# Patient Record
Sex: Male | Born: 1940
Health system: Southern US, Community
[De-identification: ages and names within clinical notes are randomized; demographics above are authoritative.]

## PROBLEM LIST (undated history)

## (undated) DIAGNOSIS — T4145XA Adverse effect of unspecified anesthetic, initial encounter: Secondary | ICD-10-CM

## (undated) DIAGNOSIS — Z8719 Personal history of other diseases of the digestive system: Secondary | ICD-10-CM

## (undated) DIAGNOSIS — K449 Diaphragmatic hernia without obstruction or gangrene: Secondary | ICD-10-CM

## (undated) DIAGNOSIS — F329 Major depressive disorder, single episode, unspecified: Secondary | ICD-10-CM

## (undated) DIAGNOSIS — IMO0001 Reserved for inherently not codable concepts without codable children: Secondary | ICD-10-CM

## (undated) DIAGNOSIS — I10 Essential (primary) hypertension: Secondary | ICD-10-CM

## (undated) DIAGNOSIS — E538 Deficiency of other specified B group vitamins: Secondary | ICD-10-CM

## (undated) DIAGNOSIS — F419 Anxiety disorder, unspecified: Secondary | ICD-10-CM

## (undated) DIAGNOSIS — F32A Depression, unspecified: Secondary | ICD-10-CM

## (undated) DIAGNOSIS — M1712 Unilateral primary osteoarthritis, left knee: Secondary | ICD-10-CM

## (undated) DIAGNOSIS — E786 Lipoprotein deficiency: Secondary | ICD-10-CM

## (undated) DIAGNOSIS — Z87442 Personal history of urinary calculi: Secondary | ICD-10-CM

## (undated) DIAGNOSIS — M25472 Effusion, left ankle: Secondary | ICD-10-CM

## (undated) DIAGNOSIS — Z862 Personal history of diseases of the blood and blood-forming organs and certain disorders involving the immune mechanism: Secondary | ICD-10-CM

## (undated) DIAGNOSIS — K922 Gastrointestinal hemorrhage, unspecified: Secondary | ICD-10-CM

## (undated) DIAGNOSIS — I2510 Atherosclerotic heart disease of native coronary artery without angina pectoris: Secondary | ICD-10-CM

## (undated) DIAGNOSIS — M1990 Unspecified osteoarthritis, unspecified site: Secondary | ICD-10-CM

## (undated) DIAGNOSIS — K219 Gastro-esophageal reflux disease without esophagitis: Secondary | ICD-10-CM

## (undated) DIAGNOSIS — E611 Iron deficiency: Secondary | ICD-10-CM

## (undated) DIAGNOSIS — M25471 Effusion, right ankle: Secondary | ICD-10-CM

## (undated) DIAGNOSIS — E785 Hyperlipidemia, unspecified: Secondary | ICD-10-CM

## (undated) DIAGNOSIS — D649 Anemia, unspecified: Secondary | ICD-10-CM

## (undated) DIAGNOSIS — T8859XA Other complications of anesthesia, initial encounter: Secondary | ICD-10-CM

## (undated) DIAGNOSIS — F1011 Alcohol abuse, in remission: Secondary | ICD-10-CM

## (undated) DIAGNOSIS — E222 Syndrome of inappropriate secretion of antidiuretic hormone: Secondary | ICD-10-CM

## (undated) HISTORY — DX: Unilateral primary osteoarthritis, left knee: M17.12

## (undated) HISTORY — PX: COLONOSCOPY: SHX5424

## (undated) HISTORY — PX: MINOR HEMORRHOIDECTOMY: SHX6238

## (undated) HISTORY — DX: Iron deficiency: E61.1

## (undated) HISTORY — PX: JOINT REPLACEMENT: SHX530

## (undated) HISTORY — DX: Deficiency of other specified B group vitamins: E53.8

## (undated) HISTORY — DX: Essential (primary) hypertension: I10

## (undated) HISTORY — PX: KNEE ARTHROSCOPY: SHX127

## (undated) HISTORY — DX: Syndrome of inappropriate secretion of antidiuretic hormone: E22.2

## (undated) HISTORY — PX: CORONARY ANGIOPLASTY: SHX604

## (undated) HISTORY — DX: Anxiety disorder, unspecified: F41.9

## (undated) HISTORY — DX: Personal history of diseases of the blood and blood-forming organs and certain disorders involving the immune mechanism: Z86.2

## (undated) HISTORY — DX: Hyperlipidemia, unspecified: E78.5

## (undated) HISTORY — PX: CARDIOVASCULAR STRESS TEST: SHX262

## (undated) HISTORY — DX: Diaphragmatic hernia without obstruction or gangrene: K44.9

## (undated) HISTORY — DX: Atherosclerotic heart disease of native coronary artery without angina pectoris: I25.10

## (undated) HISTORY — DX: Lipoprotein deficiency: E78.6

## (undated) HISTORY — PX: CATARACT EXTRACTION W/ INTRAOCULAR LENS  IMPLANT, BILATERAL: SHX1307

## (undated) HISTORY — DX: Reserved for inherently not codable concepts without codable children: IMO0001

## (undated) HISTORY — PX: EYE SURGERY: SHX253

## (undated) HISTORY — DX: Alcohol abuse, in remission: F10.11

---
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
## 1993-01-30 HISTORY — PX: CARDIAC CATHETERIZATION: SHX172

## 1998-10-25 ENCOUNTER — Encounter: Payer: Self-pay | Admitting: Cardiology

## 1998-10-25 ENCOUNTER — Ambulatory Visit (HOSPITAL_COMMUNITY): Admission: RE | Admit: 1998-10-25 | Discharge: 1998-10-25 | Payer: Self-pay | Admitting: Cardiology

## 1999-11-08 ENCOUNTER — Encounter: Payer: Self-pay | Admitting: Orthopedic Surgery

## 1999-11-08 ENCOUNTER — Ambulatory Visit (HOSPITAL_COMMUNITY): Admission: RE | Admit: 1999-11-08 | Discharge: 1999-11-08 | Payer: Self-pay | Admitting: Orthopedic Surgery

## 2000-04-28 ENCOUNTER — Emergency Department (HOSPITAL_COMMUNITY): Admission: EM | Admit: 2000-04-28 | Discharge: 2000-04-28 | Payer: Self-pay | Admitting: Emergency Medicine

## 2000-12-12 ENCOUNTER — Emergency Department (HOSPITAL_COMMUNITY): Admission: EM | Admit: 2000-12-12 | Discharge: 2000-12-12 | Payer: Self-pay | Admitting: Emergency Medicine

## 2001-03-05 ENCOUNTER — Emergency Department (HOSPITAL_COMMUNITY): Admission: EM | Admit: 2001-03-05 | Discharge: 2001-03-05 | Payer: Self-pay | Admitting: Emergency Medicine

## 2001-07-01 ENCOUNTER — Emergency Department (HOSPITAL_COMMUNITY): Admission: EM | Admit: 2001-07-01 | Discharge: 2001-07-01 | Payer: Self-pay | Admitting: Emergency Medicine

## 2001-07-03 ENCOUNTER — Inpatient Hospital Stay (HOSPITAL_COMMUNITY): Admission: EM | Admit: 2001-07-03 | Discharge: 2001-07-06 | Payer: Self-pay | Admitting: Psychiatry

## 2001-07-07 ENCOUNTER — Other Ambulatory Visit (HOSPITAL_COMMUNITY): Admission: RE | Admit: 2001-07-07 | Discharge: 2001-07-11 | Payer: Self-pay | Admitting: *Deleted

## 2002-04-23 ENCOUNTER — Emergency Department (HOSPITAL_COMMUNITY): Admission: EM | Admit: 2002-04-23 | Discharge: 2002-04-23 | Payer: Self-pay

## 2004-10-02 ENCOUNTER — Emergency Department (HOSPITAL_COMMUNITY): Admission: EM | Admit: 2004-10-02 | Discharge: 2004-10-02 | Payer: Self-pay | Admitting: Emergency Medicine

## 2005-09-04 HISTORY — PX: US ECHOCARDIOGRAPHY: HXRAD669

## 2006-04-24 ENCOUNTER — Ambulatory Visit: Payer: Self-pay | Admitting: Gastroenterology

## 2008-08-02 HISTORY — PX: REPLACEMENT TOTAL KNEE: SUR1224

## 2008-08-04 ENCOUNTER — Inpatient Hospital Stay (HOSPITAL_COMMUNITY): Admission: RE | Admit: 2008-08-04 | Discharge: 2008-08-06 | Payer: Self-pay | Admitting: Orthopedic Surgery

## 2009-04-28 ENCOUNTER — Ambulatory Visit (HOSPITAL_COMMUNITY): Admission: RE | Admit: 2009-04-28 | Discharge: 2009-04-28 | Payer: Self-pay | Admitting: Psychiatry

## 2010-08-29 ENCOUNTER — Ambulatory Visit (INDEPENDENT_AMBULATORY_CARE_PROVIDER_SITE_OTHER): Payer: Self-pay | Admitting: Cardiology

## 2010-08-29 DIAGNOSIS — I2510 Atherosclerotic heart disease of native coronary artery without angina pectoris: Secondary | ICD-10-CM

## 2010-09-08 ENCOUNTER — Telehealth (INDEPENDENT_AMBULATORY_CARE_PROVIDER_SITE_OTHER): Payer: Self-pay | Admitting: *Deleted

## 2010-09-11 ENCOUNTER — Ambulatory Visit (HOSPITAL_COMMUNITY): Payer: Medicare Other | Attending: Cardiology

## 2010-09-11 ENCOUNTER — Encounter: Payer: Self-pay | Admitting: Cardiology

## 2010-09-11 DIAGNOSIS — R0989 Other specified symptoms and signs involving the circulatory and respiratory systems: Secondary | ICD-10-CM

## 2010-09-11 DIAGNOSIS — I2510 Atherosclerotic heart disease of native coronary artery without angina pectoris: Secondary | ICD-10-CM | POA: Insufficient documentation

## 2010-09-11 DIAGNOSIS — R0609 Other forms of dyspnea: Secondary | ICD-10-CM

## 2010-09-12 NOTE — Progress Notes (Signed)
Summary: Nuclear Pre-Procedure  Phone Note Outgoing Call Call back at [facility] Phone [phone]   Call placed by: Stanton Kidney, EMT-P,  September 08, 2010 3:06 PM Action Taken: Phone Call Completed Summary of Call: Left message with information on Myov[employer] Information Sheet (see scanned document for details). Stanton Kidney, EMT-P  September 08, 2010 3:06 PM      Nuclear Med Background Indications for Stress Test: Evaluation for Ischemia, Surgical Clearance  Indications Comments: Pending (L) Knee surgery  History: Angioplasty, Heart Catheterization, Myocardial Perfusion Study  History Comments: '94 Angioplasty: LAD 1/10 MPS: NL, EF= 77%     Nuclear Pre-Procedure Cardiac Risk Factors: Family History - CAD, History of Smoking, Hypertension, Lipids

## 2010-09-19 ENCOUNTER — Other Ambulatory Visit: Payer: Self-pay

## 2010-09-19 NOTE — Assessment & Plan Note (Signed)
Summary: Cardiology Nuclear Testing  Nuclear Med Background Indications for Stress Test: Evaluation for Ischemia, Surgical Clearance  Indications Comments: Pending (L) Knee surgery  History: Angioplasty, Heart Catheterization, Myocardial Perfusion Study  History Comments: '94 Angioplasty: LAD 1/10 MPS: NL, EF= 77%     Nuclear Pre-Procedure Cardiac Risk Factors: Family History - CAD, History of Smoking, Hypertension, Lipids Caffeine/Decaff Intake: None NPO After: 7:00 PM Lungs: clear IV 0.9% NS with Angio Cath: 22g     IV Site: R Hand IV Started by: Irean Hong, RN Chest Size (in) 44     Height (in): 69 Weight (lb): 189 BMI: 28.01  Nuclear Med Study 1 or 2 day study:  1 day     Stress Test Type:  Eugenie Birks Reading MD:  Olga Millers, MD     Referring MD:  S.Tennant Resting Radionuclide:  Technetium 11m Tetrofosmin     Resting Radionuclide Dose:  10.9 mCi  Stress Radionuclide:  Technetium 23m Tetrofosmin     Stress Radionuclide Dose:  32.5 mCi   Stress Protocol  Max Systolic BP: 141 mm Hg Lexiscan: 0.4 mg   Stress Test Technologist:  Milana Na, EMT-P     Nuclear Technologist:  Domenic Polite, CNMT  Rest Procedure  Myocardial perfusion imaging was performed at rest 45 minutes following the intravenous administration of Technetium 35m Tetrofosmin.  Stress Procedure  The patient received IV Lexiscan 0.4 mg over 15-seconds.  Technetium 53m Tetrofosmin injected at 30-seconds.  There were no significant changes with infusion.  Quantitative spect images were obtained after a 45 minute delay.  QPS Raw Data Images:  Acquisition technically good; normal left ventricular size. Stress Images:  There is decreased uptake in the inferior wall Rest Images:  There is decreased uptake in the inferior wall. Subtraction (SDS):  No evidence of ischemia. Transient Ischemic Dilatation:  0.85  (Normal <1.22)  Lung/Heart Ratio:  0.16  (Normal <0.45)  Quantitative Gated Spect  Images QGS EDV:  78 ml QGS ESV:  25 ml QGS EF:  68 % QGS cine images:  Normal wall motion.   Overall Impression  Exercise Capacity: Lexiscan with no exercise. BP Response: Normal blood pressure response. Clinical Symptoms: No chest pain ECG Impression: No significant ST segment change suggestive of ischemia. Overall Impression: Normal lexiscan nuclear study with mild diaphragmatic attenuation but no ischemia.

## 2010-10-16 LAB — PROTIME-INR
INR: 1 (ref 0.00–1.49)
Prothrombin Time: 13.7 seconds (ref 11.6–15.2)

## 2010-10-16 LAB — CBC
HCT: 41.2 % (ref 39.0–52.0)
Hemoglobin: 13.9 g/dL (ref 13.0–17.0)
MCHC: 33.7 g/dL (ref 30.0–36.0)
MCV: 101.9 fL — ABNORMAL HIGH (ref 78.0–100.0)
Platelets: 222 10*3/uL (ref 150–400)
RBC: 4.04 MIL/uL — ABNORMAL LOW (ref 4.22–5.81)
RDW: 13.8 % (ref 11.5–15.5)
WBC: 6.7 10*3/uL (ref 4.0–10.5)

## 2010-10-16 LAB — URINALYSIS, ROUTINE W REFLEX MICROSCOPIC
Bilirubin Urine: NEGATIVE
Ketones, ur: NEGATIVE mg/dL
Nitrite: NEGATIVE
Protein, ur: NEGATIVE mg/dL
Urobilinogen, UA: 1 mg/dL (ref 0.0–1.0)
pH: 6.5 (ref 5.0–8.0)

## 2010-10-16 LAB — APTT: aPTT: 29 seconds (ref 24–37)

## 2010-10-16 LAB — COMPREHENSIVE METABOLIC PANEL
AST: 44 U/L — ABNORMAL HIGH (ref 0–37)
BUN: 8 mg/dL (ref 6–23)
CO2: 24 mEq/L (ref 19–32)
Calcium: 9.3 mg/dL (ref 8.4–10.5)
Creatinine, Ser: 0.94 mg/dL (ref 0.4–1.5)
GFR calc Af Amer: >60 mL/min (ref >60)
GFR calc non Af Amer: >60 mL/min (ref >60)

## 2010-10-17 LAB — CBC
HCT: 26.7 % — ABNORMAL LOW (ref 39.0–52.0)
HCT: 29.2 % — ABNORMAL LOW (ref 39.0–52.0)
Hemoglobin: 9.4 g/dL — ABNORMAL LOW (ref 13.0–17.0)
MCV: 101.9 fL — ABNORMAL HIGH (ref 78.0–100.0)
Platelets: 189 10*3/uL (ref 150–400)
RBC: 2.6 MIL/uL — ABNORMAL LOW (ref 4.22–5.81)
RBC: 2.87 MIL/uL — ABNORMAL LOW (ref 4.22–5.81)
RDW: 13.9 % (ref 11.5–15.5)
WBC: 10.7 10*3/uL — ABNORMAL HIGH (ref 4.0–10.5)

## 2010-10-17 LAB — BASIC METABOLIC PANEL
CO2: 24 mEq/L (ref 19–32)
Calcium: 8 mg/dL — ABNORMAL LOW (ref 8.4–10.5)
Chloride: 103 mEq/L (ref 96–112)
GFR calc Af Amer: >60 mL/min (ref >60)
GFR calc Af Amer: >60 mL/min (ref >60)
GFR calc non Af Amer: >60 mL/min (ref >60)
GFR calc non Af Amer: >60 mL/min (ref >60)
Glucose, Bld: 98 mg/dL (ref 70–99)
Potassium: 3.7 mEq/L (ref 3.5–5.1)
Potassium: 4 mEq/L (ref 3.5–5.1)
Sodium: 134 mEq/L — ABNORMAL LOW (ref 135–145)
Sodium: 136 mEq/L (ref 135–145)

## 2010-10-17 LAB — TYPE AND SCREEN: Antibody Screen: NEGATIVE

## 2010-10-17 LAB — PROTIME-INR: Prothrombin Time: 14.4 seconds (ref 11.6–15.2)

## 2010-10-30 ENCOUNTER — Emergency Department (HOSPITAL_COMMUNITY)
Admission: EM | Admit: 2010-10-30 | Discharge: 2010-10-31 | Disposition: A | Discharge status: Other Acute Inpatient Hospital | Payer: Medicare Other | Source: Home / Self Care | Attending: Emergency Medicine | Admitting: Emergency Medicine

## 2010-10-30 DIAGNOSIS — Z79899 Other long term (current) drug therapy: Secondary | ICD-10-CM | POA: Insufficient documentation

## 2010-10-30 DIAGNOSIS — F1010 Alcohol abuse, uncomplicated: Secondary | ICD-10-CM | POA: Insufficient documentation

## 2010-10-30 DIAGNOSIS — I2510 Atherosclerotic heart disease of native coronary artery without angina pectoris: Secondary | ICD-10-CM | POA: Insufficient documentation

## 2010-10-30 LAB — HEPATIC FUNCTION PANEL
Albumin: 3.7 g/dL (ref 3.5–5.2)
Bilirubin, Direct: 0.2 mg/dL (ref 0.0–0.3)
Total Bilirubin: 0.9 mg/dL (ref 0.3–1.2)

## 2010-10-30 LAB — DIFFERENTIAL
Basophils Absolute: 0 10*3/uL (ref 0.0–0.1)
Basophils Relative: 0 % (ref 0–1)
Eosinophils Absolute: 0.1 10*3/uL (ref 0.0–0.7)
Eosinophils Relative: 1 % (ref 0–5)
Neutrophils Relative %: 29 % — ABNORMAL LOW (ref 43–77)

## 2010-10-30 LAB — BASIC METABOLIC PANEL
CO2: 23 mEq/L (ref 19–32)
Calcium: 8.8 mg/dL (ref 8.4–10.5)
Creatinine, Ser: 1.03 mg/dL (ref 0.4–1.5)
GFR calc Af Amer: >60 mL/min (ref >60)
GFR calc non Af Amer: >60 mL/min (ref >60)

## 2010-10-30 LAB — URINALYSIS, ROUTINE W REFLEX MICROSCOPIC
Bilirubin Urine: NEGATIVE
Nitrite: NEGATIVE
Specific Gravity, Urine: 1.011 (ref 1.005–1.030)
Urobilinogen, UA: 1 mg/dL (ref 0.0–1.0)
pH: 6 (ref 5.0–8.0)

## 2010-10-30 LAB — RAPID URINE DRUG SCREEN, HOSP PERFORMED
Amphetamines: NOT DETECTED
Cocaine: NOT DETECTED
Opiates: NOT DETECTED
Tetrahydrocannabinol: NOT DETECTED

## 2010-10-30 LAB — CBC
Platelets: 135 10*3/uL — ABNORMAL LOW (ref 150–400)
RBC: 3.73 MIL/uL — ABNORMAL LOW (ref 4.22–5.81)
RDW: 12.7 % (ref 11.5–15.5)
WBC: 7.6 10*3/uL (ref 4.0–10.5)

## 2010-10-30 LAB — ETHANOL: Alcohol, Ethyl (B): 356 mg/dL — ABNORMAL HIGH (ref 0–10)

## 2010-10-31 ENCOUNTER — Inpatient Hospital Stay (HOSPITAL_COMMUNITY)
Admission: AD | Admit: 2010-10-31 | Discharge: 2010-11-04 | DRG: 897 | Disposition: A | Discharge status: Other Acute Inpatient Hospital | Payer: Medicare Other | Source: Ambulatory Visit | Attending: Psychiatry | Admitting: Psychiatry

## 2010-10-31 DIAGNOSIS — I10 Essential (primary) hypertension: Secondary | ICD-10-CM

## 2010-10-31 DIAGNOSIS — F1020 Alcohol dependence, uncomplicated: Principal | ICD-10-CM

## 2010-10-31 DIAGNOSIS — Z7982 Long term (current) use of aspirin: Secondary | ICD-10-CM

## 2010-10-31 DIAGNOSIS — Z96659 Presence of unspecified artificial knee joint: Secondary | ICD-10-CM

## 2010-10-31 DIAGNOSIS — Z9849 Cataract extraction status, unspecified eye: Secondary | ICD-10-CM

## 2010-10-31 DIAGNOSIS — Z880 Allergy status to penicillin: Secondary | ICD-10-CM

## 2010-10-31 DIAGNOSIS — E785 Hyperlipidemia, unspecified: Secondary | ICD-10-CM

## 2010-10-31 DIAGNOSIS — Z9861 Coronary angioplasty status: Secondary | ICD-10-CM

## 2010-10-31 DIAGNOSIS — I2510 Atherosclerotic heart disease of native coronary artery without angina pectoris: Secondary | ICD-10-CM

## 2010-10-31 DIAGNOSIS — D696 Thrombocytopenia, unspecified: Secondary | ICD-10-CM

## 2010-10-31 LAB — HEPATIC FUNCTION PANEL
AST: 85 U/L — ABNORMAL HIGH (ref 0–37)
Albumin: 3.8 g/dL (ref 3.5–5.2)
Alkaline Phosphatase: 75 U/L (ref 39–117)
Total Protein: 7.5 g/dL (ref 6.0–8.3)

## 2010-10-31 LAB — ETHANOL: Alcohol, Ethyl (B): <11 mg/dL — ABNORMAL HIGH (ref 0–10)

## 2010-11-03 DIAGNOSIS — F1020 Alcohol dependence, uncomplicated: Secondary | ICD-10-CM

## 2010-11-04 ENCOUNTER — Inpatient Hospital Stay (HOSPITAL_COMMUNITY): Payer: Medicare Other

## 2010-11-04 ENCOUNTER — Inpatient Hospital Stay (HOSPITAL_COMMUNITY): Admit: 2010-11-04 | Payer: Self-pay

## 2010-11-04 ENCOUNTER — Ambulatory Visit (HOSPITAL_COMMUNITY)
Admission: EM | Admit: 2010-11-04 | Discharge: 2010-11-04 | Disposition: A | Discharge status: Home / Self Care | Payer: Medicare Other | Source: Ambulatory Visit | Attending: Emergency Medicine | Admitting: Emergency Medicine

## 2010-11-04 ENCOUNTER — Observation Stay (HOSPITAL_COMMUNITY)
Admission: RE | Admit: 2010-11-04 | Discharge: 2010-11-07 | Disposition: A | Discharge status: Home / Self Care | Payer: Medicare Other | Source: Other Acute Inpatient Hospital | Attending: Internal Medicine | Admitting: Internal Medicine

## 2010-11-04 DIAGNOSIS — I10 Essential (primary) hypertension: Secondary | ICD-10-CM | POA: Insufficient documentation

## 2010-11-04 DIAGNOSIS — I2510 Atherosclerotic heart disease of native coronary artery without angina pectoris: Secondary | ICD-10-CM | POA: Insufficient documentation

## 2010-11-04 DIAGNOSIS — D696 Thrombocytopenia, unspecified: Secondary | ICD-10-CM | POA: Insufficient documentation

## 2010-11-04 DIAGNOSIS — M7989 Other specified soft tissue disorders: Principal | ICD-10-CM | POA: Insufficient documentation

## 2010-11-04 DIAGNOSIS — M79609 Pain in unspecified limb: Secondary | ICD-10-CM | POA: Insufficient documentation

## 2010-11-04 DIAGNOSIS — D72829 Elevated white blood cell count, unspecified: Secondary | ICD-10-CM | POA: Insufficient documentation

## 2010-11-04 DIAGNOSIS — E785 Hyperlipidemia, unspecified: Secondary | ICD-10-CM | POA: Insufficient documentation

## 2010-11-04 DIAGNOSIS — F1010 Alcohol abuse, uncomplicated: Secondary | ICD-10-CM | POA: Insufficient documentation

## 2010-11-04 DIAGNOSIS — K219 Gastro-esophageal reflux disease without esophagitis: Secondary | ICD-10-CM | POA: Insufficient documentation

## 2010-11-04 DIAGNOSIS — E871 Hypo-osmolality and hyponatremia: Secondary | ICD-10-CM | POA: Insufficient documentation

## 2010-11-04 DIAGNOSIS — M109 Gout, unspecified: Secondary | ICD-10-CM | POA: Insufficient documentation

## 2010-11-04 DIAGNOSIS — Z79899 Other long term (current) drug therapy: Secondary | ICD-10-CM | POA: Insufficient documentation

## 2010-11-04 DIAGNOSIS — E876 Hypokalemia: Secondary | ICD-10-CM | POA: Insufficient documentation

## 2010-11-04 LAB — CBC
HCT: 38.9 % — ABNORMAL LOW (ref 39.0–52.0)
Hemoglobin: 13.5 g/dL (ref 13.0–17.0)
MCH: 35.4 pg — ABNORMAL HIGH (ref 26.0–34.0)
MCHC: 34.7 g/dL (ref 30.0–36.0)
MCV: 102.1 fL — ABNORMAL HIGH (ref 78.0–100.0)
Platelets: 145 K/uL — ABNORMAL LOW (ref 150–400)
Platelets: 130 10*3/uL — ABNORMAL LOW (ref 150–400)
RBC: 3.81 MIL/uL — ABNORMAL LOW (ref 4.22–5.81)
RBC: 3.6 MIL/uL — ABNORMAL LOW (ref 4.22–5.81)
RDW: 12.8 % (ref 11.5–15.5)
RDW: 12.8 % (ref 11.5–15.5)
WBC: 10.9 10*3/uL — ABNORMAL HIGH (ref 4.0–10.5)
WBC: 9.5 10*3/uL (ref 4.0–10.5)

## 2010-11-04 LAB — PHOSPHORUS: Phosphorus: 3.6 mg/dL (ref 2.3–4.6)

## 2010-11-04 LAB — COMPREHENSIVE METABOLIC PANEL WITH GFR
ALT: 26 U/L (ref 0–53)
Albumin: 3.5 g/dL (ref 3.5–5.2)
BUN: 14 mg/dL (ref 6–23)
Chloride: 97 meq/L (ref 96–112)
Creatinine, Ser: 0.97 mg/dL (ref 0.4–1.5)
Glucose, Bld: 118 mg/dL — ABNORMAL HIGH (ref 70–99)

## 2010-11-04 LAB — DIFFERENTIAL
Basophils Absolute: 0 10*3/uL (ref 0.0–0.1)
Basophils Absolute: 0 10*3/uL (ref 0.0–0.1)
Basophils Relative: 0 % (ref 0–1)
Basophils Relative: 0 % (ref 0–1)
Eosinophils Absolute: 0 K/uL (ref 0.0–0.7)
Eosinophils Absolute: 0.1 10*3/uL (ref 0.0–0.7)
Eosinophils Relative: 0 % (ref 0–5)
Eosinophils Relative: 1 % (ref 0–5)
Lymphocytes Relative: 19 % (ref 12–46)
Lymphs Abs: 2.1 K/uL (ref 0.7–4.0)
Lymphs Abs: 2.9 10*3/uL (ref 0.7–4.0)
Monocytes Absolute: 1.5 K/uL — ABNORMAL HIGH (ref 0.1–1.0)
Monocytes Relative: 14 % — ABNORMAL HIGH (ref 3–12)
Neutro Abs: 7.3 10*3/uL (ref 1.7–7.7)
Neutrophils Relative %: 67 % (ref 43–77)
Neutrophils Relative %: 56 % (ref 43–77)

## 2010-11-04 LAB — COMPREHENSIVE METABOLIC PANEL
AST: 32 U/L (ref 0–37)
AST: 28 U/L (ref 0–37)
Albumin: 3.1 g/dL — ABNORMAL LOW (ref 3.5–5.2)
Alkaline Phosphatase: 84 U/L (ref 39–117)
Alkaline Phosphatase: 63 U/L (ref 39–117)
CO2: 25 mEq/L (ref 19–32)
Calcium: 9.6 mg/dL (ref 8.4–10.5)
Chloride: 99 mEq/L (ref 96–112)
GFR calc Af Amer: >60 mL/min (ref >60)
GFR calc Af Amer: >60 mL/min (ref >60)
GFR calc non Af Amer: >60 mL/min (ref >60)
Potassium: 2.9 mEq/L — ABNORMAL LOW (ref 3.5–5.1)
Potassium: 3 mEq/L — ABNORMAL LOW (ref 3.5–5.1)
Sodium: 134 mEq/L — ABNORMAL LOW (ref 135–145)
Sodium: 135 mEq/L (ref 135–145)
Total Bilirubin: 1.1 mg/dL (ref 0.3–1.2)
Total Bilirubin: 1.2 mg/dL (ref 0.3–1.2)
Total Protein: 7.6 g/dL (ref 6.0–8.3)
Total Protein: 7 g/dL (ref 6.0–8.3)

## 2010-11-04 LAB — URIC ACID: Uric Acid, Serum: 6.2 mg/dL (ref 4.0–7.8)

## 2010-11-04 LAB — LACTIC ACID, PLASMA: Lactic Acid, Venous: 1 mmol/L (ref 0.5–2.2)

## 2010-11-04 LAB — PROCALCITONIN: Procalcitonin: <0.1 ng/mL

## 2010-11-05 DIAGNOSIS — F1020 Alcohol dependence, uncomplicated: Secondary | ICD-10-CM

## 2010-11-05 LAB — URINALYSIS, ROUTINE W REFLEX MICROSCOPIC
Glucose, UA: NEGATIVE mg/dL
Hgb urine dipstick: NEGATIVE
Protein, ur: NEGATIVE mg/dL
Specific Gravity, Urine: 1.014 (ref 1.005–1.030)
pH: 5.5 (ref 5.0–8.0)

## 2010-11-05 LAB — BASIC METABOLIC PANEL
BUN: 14 mg/dL (ref 6–23)
CO2: 21 mEq/L (ref 19–32)
Chloride: 101 mEq/L (ref 96–112)
Glucose, Bld: 154 mg/dL — ABNORMAL HIGH (ref 70–99)
Potassium: 3.5 mEq/L (ref 3.5–5.1)
Sodium: 132 mEq/L — ABNORMAL LOW (ref 135–145)

## 2010-11-05 LAB — DIFFERENTIAL
Basophils Absolute: 0 10*3/uL (ref 0.0–0.1)
Eosinophils Relative: 2 % (ref 0–5)
Lymphocytes Relative: 26 % (ref 12–46)
Lymphs Abs: 2 10*3/uL (ref 0.7–4.0)
Neutro Abs: 4.4 10*3/uL (ref 1.7–7.7)

## 2010-11-05 LAB — CBC
HCT: 33.6 % — ABNORMAL LOW (ref 39.0–52.0)
Hemoglobin: 11.7 g/dL — ABNORMAL LOW (ref 13.0–17.0)
MCV: 102.1 fL — ABNORMAL HIGH (ref 78.0–100.0)
WBC: 7.7 10*3/uL (ref 4.0–10.5)

## 2010-11-06 LAB — BASIC METABOLIC PANEL
Chloride: 106 mEq/L (ref 96–112)
GFR calc non Af Amer: >60 mL/min (ref >60)
Glucose, Bld: 117 mg/dL — ABNORMAL HIGH (ref 70–99)
Potassium: 3.8 mEq/L (ref 3.5–5.1)
Sodium: 135 mEq/L (ref 135–145)

## 2010-11-06 LAB — URINE CULTURE
Colony Count: NO GROWTH
Culture  Setup Time: 201205070010
Culture: NO GROWTH
Special Requests: NEGATIVE

## 2010-11-06 LAB — CBC
HCT: 34 % — ABNORMAL LOW (ref 39.0–52.0)
Hemoglobin: 11.6 g/dL — ABNORMAL LOW (ref 13.0–17.0)
MCH: 35 pg — ABNORMAL HIGH (ref 26.0–34.0)
MCHC: 34.1 g/dL (ref 30.0–36.0)
MCV: 102.7 fL — ABNORMAL HIGH (ref 78.0–100.0)
Platelets: 169 K/uL (ref 150–400)
RBC: 3.31 MIL/uL — ABNORMAL LOW (ref 4.22–5.81)
RDW: 12.6 % (ref 11.5–15.5)
WBC: 6.3 K/uL (ref 4.0–10.5)

## 2010-11-06 NOTE — H&P (Signed)
NAME:  Dennis York, Dennis York NO.:  [id number]  MEDICAL RECORD NO.:  [id number]           PATIENT TYPE:  I  LOCATION:  0300                          FACILITY[facility]  [facility]  PHYSICIAN:  Anselm Jungling, MD  DATE OF BIRTH:  12-02-1940  DATE OF ADMISSION:  10/31/2010 DATE OF DISCHARGE:                      PSYCHIATRIC ADMISSION ASSESSMENT   IDENTIFYING INFORMATION:  A 70 year old married Caucasian male, this is a voluntary admission.  HISTORY OF PRESENT ILLNESS:  This is a third [facility] admission for Dennis York, who was brought to the emergency room by his wife, who was concerned that he had an alcohol problem.  His alcohol level was found to be 356 on initial assessment.  He admits to the daily use of wine and beer, but minimizes it and says he only drinks 1-4 cans of beer daily and drinks no liquor.  He reports he has a history of chronic anxiety, which is much worse in the morning, which awakens him early in the morning feeling startled and anxious.  He denies any suicidal thoughts and believes that this high alcohol level is due to one time excessive use of alcohol yesterday.  He denies any other substance abuse.  PAST PSYCHIATRIC HISTORY:  He has a history of previous admissions to [facility] in 2001, when he was detoxed from scotch and beer here.  He also was significantly depressed and showed marked improvement in his symptoms on Celexa 20 mg daily.  Also [facility] admissions for alcohol abuse and detox in January 2003, and was seen as an outpatient in October 2010.  He denies any history of other substance abuse, denies a history of suicidal thoughts.  SOCIAL HISTORY:  This is a married Caucasian male.  He lives at home with his wife.  He is retired from [employer].  No known legal charges.  FAMILY HISTORY:  Not available.  PRIMARY CARE PHYSICIAN:  Dr. Dara Hoyer at the [facility] of [place].  MEDICAL PROBLEMS: 1. Thrombocytopenia, NOS. 2.  Coronary artery disease with history of angioplasty. 3. Hypertension. 4. Dyslipidemia. 5. Also has a previous history of a right total knee replacement and     cataract surgery.  CURRENT MEDICATIONS: 1. Aspirin 81 mg daily. 2. Amlodipine 5 mg daily. 3. Metoprolol 25 mg 1/2 tablet twice daily. 4. Prilosec 20 mg daily.  DRUG ALLERGIES:  PENICILLIN AND BEE STINGS.  POSITIVE PHYSICAL FINDINGS:  GENERAL:  This is a stocky built male, 5 feet 9 inches tall, 86 kg. VITAL SIGNS:  On admission, temperature 97.6, pulse 115, respirations 20, blood pressure 149/88.  Urine drug screen negative for all substances.  Initial alcohol level 356 mg/dL.  CBC; WBC 7.6, hemoglobin 13.2, hematocrit 37.6, platelets 135,000, MCV 100.8.  UA was normal.  Chemistries normal.  BUN 8, creatinine 1.03.  Hepatic function; SGOT 98, SGPT 42, alkaline phosphatase 71, total bilirubin 0.9.  MENTAL STATUS EXAM:  This is fully alert male, oriented to person and basic situation, cooperative.  He has been vomiting clear fluid consistently since he arrived from the emergency room and we have given him Phenergan 6.25 mg IM and he earlier today  had 4 mg of Zofran. Speech is non-pressured.  Mood is neutral.  Thinking is logical.  No delusional statements made.  His insight seems very superficial and he is not able to give a very detailed history given his nausea.  He is denying any abdominal pain.  No delusional statements made, no suicidal thoughts.  Axis I:  Alcohol abuse and dependence. Axis II:  No diagnosis. Axis III:  Hypertension, history of coronary artery disease with previous angioplasty, thrombocytopenia, not otherwise specified. Axis IV:  Deferred. Axis V:  Global Assessment of Functioning current 40, past year not known.  PLAN:  Voluntarily admit him with a goal of a safe detox in 4 days.  We started him on a Librium protocol and will give him Zofran 4 mg ODT or IM q.6 h., p.r.n. for nausea or vomiting.   Force fluids.  He can have Ensure 1 can q.i.d. as tolerated along with ginger ale and other clear liquids.  He will receive vitamins as part of his detox protocol and will continue his other routine medications.  We hope to get his wife involved in treatment.     Margaret A. Lorin Picket, N.P.   ______________________________ Anselm Jungling, MD    MAS/MEDQ  D:  10/31/2010  T:  11/01/2010  Job:  [id number]  Electronically Signed by Kari Baars N.P. on 11/06/2010 10:13:42 AM Electronically Signed by Geralyn Flash MD on 11/06/2010 12:12:18 PM

## 2010-11-06 NOTE — Discharge Summary (Signed)
NAME:  Dennis York, Dennis York NO.:  [id number]  MEDICAL RECORD NO.:  [id number]           PATIENT TYPE:  I  LOCATION:  0300                          [facility]ACILITY:  [facility]  PHYSICIAN:  Anselm Jungling, MD  DATE OF BIRTH:  12/12/1940  DATE OF ADMISSION:  10/31/2010 DATE OF DISCHARGE:  11/04/2010                              DISCHARGE SUMMARY   IDENTIFYING INFORMATION:  This is a 70 year old married Caucasian male. This is a voluntary admission.  HISTORY OF PRESENT ILLNESS:  Third [facility] admission for Dennis York, who was brought to the emergency room by his wife, concerned that he had an alcohol problem and registering an initial alcohol level of 356 mg/dL. He admitted to the daily use of wine and beer.  He had also reported a history of chronic anxiety, worse in the morning.  He was denying suicidal thoughts.  PAST PSYCHIATRIC HISTORY:  Revealed 2 previous admissions, one most recently in 2003, also for alcohol abuse and detox.  He had a history of treatment for depression in 2001 that responded well to Celexa 20 mg daily.  MEDICAL EVALUATION AND DIAGNOSTIC STUDIES:  Primary care physician is Dara Hoyer at [facility] [place].  MEDICAL PROBLEMS:  Thrombocytopenia, coronary artery disease with history of angioplasty, hypertension, and dyslipidemia.  Also previous history of right total knee replacement.  In the emergency room, CBC with a normal hemoglobin of 13.2, platelets 135,000, MCV 100.8.  UA was normal.  Chemistries normal.  BUN 8, creatinine 1.03.  Hepatic function:  SGOT 98, SGPT 42, alkaline phosphatase 71, total bilirubin 0.9.  COURSE OF HOSPITALIZATION:  He was admitted to our detox program and started on a Librium protocol with a goal of a safe detox in 4 days.  In the first 24 hours, he did have some vomiting which was readily controlled with Zofran 4 mg ODT.  He was given oral clear liquids and within 24 hours progressed to a normal diet.  His detox  was otherwise uneventful.  He was also continued on his routine medications of metoprolol 12.5 mg b.i.d., Prilosec 20 mg daily, amlodipine 5 mg daily, and aspirin 81 mg daily.  He was fully detoxed by Nov 04, 2010.  He was needing standby assistance in getting out of bed and was ambulating holding to furniture.  Therefore we obtained a physical therapy and occupational therapy consult to evaluate his gait and help develop a program for him.  They recommended ambulation with a walker. On the morning of Saturday, Nov 04, 2010, he registered a temperature of 100 and was complaining of feeling poorly and did not get out of bed. He was noted to have redness in his right hand and was transferred to the emergency room for evaluation and subsequently admitted to our medical unit for evaluation of a possible cellulitis in his right hand.  DISCHARGE DIAGNOSES:  Axis I:  Alcohol abuse and dependence. Axis II:  No diagnosis. axis III:  Hypertension, history of coronary artery disease with previous angioplasty, thrombocytopenia not otherwise specified. Axis IV:  Deferred. Axis V:  Current 50, past year not known.  Margaret A. Lorin Picket, N.P.   ______________________________ Anselm Jungling, MD    MAS/MEDQ  D:  11/06/2010  T:  11/06/2010  Job:  [id number]  Electronically Signed by Kari Baars N.P. on 11/06/2010 10:13:54 AM Electronically Signed by Geralyn Flash MD on 11/06/2010 12:12:21 PM

## 2010-11-07 ENCOUNTER — Other Ambulatory Visit: Payer: Self-pay | Admitting: Cardiology

## 2010-11-07 LAB — BASIC METABOLIC PANEL
BUN: 12 mg/dL (ref 6–23)
Calcium: 8.9 mg/dL (ref 8.4–10.5)
GFR calc non Af Amer: >60 mL/min (ref >60)
Glucose, Bld: 91 mg/dL (ref 70–99)

## 2010-11-07 LAB — CBC
HCT: 33 % — ABNORMAL LOW (ref 39.0–52.0)
MCHC: 34.2 g/dL (ref 30.0–36.0)
MCV: 102.2 fL — ABNORMAL HIGH (ref 78.0–100.0)
RDW: 12.3 % (ref 11.5–15.5)

## 2010-11-07 NOTE — Consult Note (Signed)
  NAME:  Dennis York, Dennis York NO.:  [id number]  MEDICAL RECORD NO.:  [id number]           PATIENT TYPE:  I  LOCATION:  1344                         FACILITY:  [facility]  PHYSICIAN:  Eulogio Ditch, MD DATE OF BIRTH:  10/15/1940  DATE OF CONSULTATION:  11/05/2010 DATE OF DISCHARGE:                                CONSULTATION   REASON FOR CONSULTATION:  Alcohol abuse and dependence.  HISTORY OF PRESENT ILLNESS:  This is a 70 year old Caucasian male who was admitted to [facility] on Oct 31, 2010, and was transferred yesterday to ER from [facility] as he was having a fever of 103. The patient was admitted on the medical floor for treatment of the gout and osteomyelitis for his right ring finger.  The patient currently do not have any withdrawal symptoms.  The patient's detox is finished at this time and the patient can be sent to rehab after discharge from the medical floor.  The patient told me that he wants to go to [facility] and his wife is going to take him there.  The patient lives at home with his wife.  The patient is retired from [employer], [employer].  MENTAL STATUS EXAMINATION:  Calm, cooperative during the interview. Fair eye contact.  No abnormal movements noticed.  Speech normal in rate, rhythm and volume.  Mood euthymic.  Affect mood congruent. Thought process logical and goal directed.  Thought content not suicidal or homicidal, no delusional thought perception.  No audiovisual hallucination reported, not internally preoccupied.  Cognition; alert, awake, oriented x3.  Memory; immediate, recent, remote fair.  Attention and concentration good.  Abstraction ability good.  Insight and judgment intact.  DIAGNOSIS:  AXIS I:  Chronic alcohol dependence. AXIS II:  Deferred. AXIS III:  See medical notes. AXIS IV:  Chronic alcohol abuse. AXIS V:  60.  RECOMMENDATIONS: 1. Once the patient is medically cleared, he can be  discharged to     follow up at the rehab in the inpatient setting. 2. The patient is cleared at this time from Psych for discharge.  The     patient do not have to come back to the [facility].     Eulogio Ditch, MD     SA/MEDQ  D:  11/05/2010  T:  11/05/2010  Job:  [id number]  Electronically Signed by Eulogio Ditch  on 11/07/2010 04:51:39 PM

## 2010-11-10 LAB — CULTURE, BLOOD (ROUTINE X 2)
Culture  Setup Time: 201205051744
Culture  Setup Time: 201205051744
Culture: NO GROWTH
Culture: NO GROWTH

## 2010-11-14 NOTE — Op Note (Signed)
NAME:  Dennis York, Dennis York NO.:  [id number]   MEDICAL RECORD NO.:  [id number]          PATIENT TYPE:  INP   LOCATION:  5024                         FACILITY:  [facility]   PHYSICIAN:  Loreta Ave, M.D. DATE OF BIRTH:  1940/08/01   DATE OF PROCEDURE:  08/04/2008  DATE OF DISCHARGE:                               OPERATIVE REPORT   PREOPERATIVE DIAGNOSIS:  End-stage degenerative arthritis, right knee.  Varus alignment, mild flexion contracture.  Bone loss, proximal medial  tibia.   POSTOPERATIVE DIAGNOSIS:  End-stage degenerative arthritis, right knee.  Varus alignment, mild flexion contracture.  Bone loss, proximal medial  tibia.   PROCEDURE:  Right total knee replacement, modified minimally invasive  approach.  Stryker triathlon prosthesis.  Cemented pegged posterior  stabilized #6 femoral component.  Cemented #7 tibial component with a 13-  mm polyethylene insert.  Resurfacing 38-mm patellar component cemented.  Soft tissue balancing.   SURGEON:  Loreta Ave, MD   ASSISTANT:  Genene Churn. Barry Dienes, [place], present throughout the entire case  necessary for timely completion of procedure.   ANESTHESIA:  General   BLOOD LOSS:  Minimal.   SPECIMENS:  None.   CULTURES:  None.   COMPLICATIONS:  None.   DRESSINGS:  Soft compressive with knee immobilizer.   DRAIN:  Hemovac x1.   TOURNIQUET TIME:  1 hour.   PROCEDURE:  The patient was brought to the operating room, placed on the  operating table in supine position.  After adequate anesthesia had been  obtained, knee examined.  Varus alignment correctable almost to neutral.  Minimal flexion contracture, further flexion 100 degrees.  Stable  ligaments.  Tourniquet applied.  Prepped and draped in usual sterile  fashion.  Exsanguinated with elevation, Esmarch tourniquet inflated to  350 mmHg.  Straight incision above the patella down to tibial tubercle.  Medial arthrotomy splitting the vastus medialis superiorly  preserving  quad tendon.  Knee exposed.  Grade 4 change throughout.  Periarticular  spurs, remnants of menisci, cruciate ligaments, loose bodies removed.  Distal femur exposed.  Intramedullary guide placed.  A 10-mm cut set at  5 degrees of valgus.  Sized, cut, and fitted for a #6 component using  epicondylar axis.  Femur was relatively wide side-to-side greater than  anterior to posterior, but I felt that a #6 did the best job of fitting  in both directions.  After jigs put in place, definitive cuts were made,  good sizing and fitting with a #6 component.  Trial removed.  Tibia  exposed.  Extramedullary guide placed.  Cut below the medial defect 3-  degree posterior slope cut.  Size #7 component.  All recesses examined.  All spurs, all loose bodies removed.  Irrigation with a pulse irrigating  device.  Trials put in place.  #6 on the femur, #7 on the tibia with a  30-mm insert inserted and on the medial capsular release, I had a nicely  balanced knee with good motion, normal mechanical axis, good flexion,  extension, stability.  Patella exposed.  Spurs removed.  Posterior 10 mm  removed.  Sized, drilled,  and fitted for a 38-mm component.  Excellent  tracking.  All trials removed.  Copious irrigation with a pulse  irrigating device.  Cement prepared, placed on all components which were  firmly seated and the polyethylene attached to tibia.  Knee reduced.  Once cement hardened, reexamined.  Full extension, full flexion, good  mechanical axis, good alignment, good stability, good patellofemoral  tracking.  Wound irrigated.  Hemovac placed through a separate stab  wound and brought out.  Arthrotomy closed #1 Vicryl, skin and  subcutaneous tissue with Vicryl and staples.  Knee injected with  Marcaine.  Hemovac clamped.  Sterile compressive dressing applied.  Tourniquet deflated and removed.  Knee immobilizer applied.  Anesthesia  reversed.  Brought to the recovery room.  Tolerated surgery  well.  No  complications.      Loreta Ave, M.D.  Electronically Signed     DFM/MEDQ  D:  08/04/2008  T:  08/05/2008  Job:  [id number]

## 2010-11-17 NOTE — Discharge Summary (Signed)
[facility]  Patient:    Dennis York, Dennis York Visit Number: [id number] MRN: [id number]          Service Type: PSY Location: [facility] Attending Physician:  Denny Peon Dictated by:   Netta Cedars, M.D. Admit Date:  07/07/2001 Disc. Date: 07/06/01                             Discharge Summary  INTRODUCTION:  The patient is a 70 year old white married male who was admitted voluntarily to the unit because of exacerbation of depression and problems with alcohol dependence.  The patient was seen on an outpatient basis as a candidate for the Intensive Outpatient Program.  He was evaluated by Netta Cedars, M.D., and referred for psychiatric admission for detoxification. The patient was using alcohol on a daily basis, drinking at least four to five beers and some mixed drinks daily.  Medically, he suffers from elevated blood pressure and underside strongly felt that he should be safely detoxified before further treatment could be initiated.  HOSPITAL COURSE:  After admission to the ward, the patient was placed on standard observation and low dose Librium protocol was introduced.  The patient tolerated Librium well with only a minor sense of withdrawal on the first day as evident by increased blood pressure and pulse and requiring p.r.n. medication.  He was started on January 4 on Celexa 20 mg daily.  He required Ambien p.r.n. insomnia.  Within the next two days, he improved markedly.  Mood had markedly lifted, sleep had improved, no signs of withdrawal were noted.  The patient did not express any dangerous thoughts.  I had a chance to meet with his wife and his wife and the patient both felt that he was ready for discharge for outpatient treatment.  Review of blood work showed normal CBC with differential, normal Chem 17 with the exception of a slight elevation of SGOT and SGPT, respectively 88 and 57.  Bilirubin was slightly elevated, total bilirubin 1.3  with normal up to 1.2.  Otherwise, CBC with differential and CMET was normal.  Thyroid function tests were normal. Vital signs showed two instances of elevated blood pressure of 149/93 and 160/101; otherwise normal.  DISCHARGE DIAGNOSES: Axis I:    1. Depressive disorder, not otherwise specified.            2. Alcohol abuse in early remission. Axis II:   No diagnosis. Axis III:  Arterial hypertension. Axis IV:   Moderate to severe stressors, legal problems and problems related            to drinking. Axis V:    Global assessment of functioning at the time of admission was 30,            for the past year maximum 70, upon discharge 60.  DISCHARGE MEDICATIONS: 1. Vitamin B, multivitamin. 2. Aspirin 81 mg daily. 3. Lipitor 10 mg daily. 4. Lisinopril 10 mg daily. 5. Celexa 200 mg daily.  RECOMMENDATIONS:  He should follow a low fat and low cholesterol diet, drink no alcohol.  He should check liver panel within one week and check with his family physician if elevation of blood pressure persists.  The patient should attend AA meetings daily and was referred to intensive outpatient treatment starting July 07, 2001.  The patient agreed with instructions and side effects of medications were explained to him.  In good condition he was discharged home in the care of his  wife. Dictated by:   Netta Cedars, M.D. Attending Physician:  Denny Peon DD:  07/11/01 TD:  07/12/01 Job: [id number]

## 2010-11-17 NOTE — H&P (Addendum)
NAME:  Dennis York, Dennis York NO.:  [id number]  MEDICAL RECORD NO.:  [id number]           PATIENT TYPE:  O  LOCATION:  ED                           FACILITY:  [facility]  PHYSICIAN:  Kathlen Mody, MD       DATE OF BIRTH:  07/27/1940  DATE OF ADMISSION:  11/04/2010 DATE OF DISCHARGE:                             HISTORY & PHYSICAL   PRIMARY CARE PHYSICIAN:  Dr. Arlyce Dice at [facility].  CHIEF COMPLAINT:  Right ring finger swelling and erythema since 2 days.  HISTORY OF PRESENT ILLNESS:  This is a 70 year old gentleman with the history of hypertension, hyperlipidemia, coronary artery disease, alcohol abuse, GERD, thrombocytopenia, history of gout who was in [facility] for alcohol detox, was sent to [facility] ED for right ring finger swelling and erythema for more than 2 days associated with pain on movement associated with fever and chills.  The patient denies any other complaints like chest pain, shortness of breath, palpitations, or cough. Denies abdominal pain, nausea, vomiting, or diarrhea.  Denies any urinary complaints or bowel complaints.  The patient denies any headache or blurry vision or any focal weakness or tingling or numbness anywhere in the body.  As per the [facility], the patient was complaining of frequent urination.  The patient denies any frequent urination when he was asked today.  REVIEW OF SYSTEMS:  See HPI, otherwise negative.  PAST MEDICAL HISTORY:  Gout, hypertension, hyperlipidemia, coronary artery disease, GERD, thrombocytopenia, and alcohol abuse.  PAST SURGICAL HISTORY:  The patient had multiple knee replacements, cataract surgery, and history of angioplasty.  HOME MEDICATIONS:  Aspirin 81 mg, amlodipine 5 mg, metoprolol 12.5 mg twice daily, and Prilosec 20 mg daily.  ALLERGIES:  PENICILLIN AND BARBITURATES.  FAMILY HISTORY:  No significant.  SOCIAL HISTORY:  The patient is married, lives at home with his wife. Takes alcohol every  day.  Denies smoking or IV drug abuse.  PHYSICAL EXAMINATION:  VITAL SIGNS:  The patient's temperature was 103.5, pulse of 90 per minute, respiratory rate 18 per minute, blood pressure 150/80, saturating 96% on room air. GENERAL:  He is alert.  He is found to be febrile, comfortable in no acute distress. HEENT:  Pupils reacting to light.  Dry mucous membranes.  No JVD. CARDIOVASCULAR:  S1 and S2. RESPIRATORY:  Good air entry bilateral.  No wheezing or rhonchi. ABDOMEN:  Soft, nontender, and nondistended. EXTREMITIES:  No pedal edema.  Right ring finger swollen and erythematous, the proximal interphalangeal joint is also swollen. NEUROLOGIC:  Nonfocal.  PERTINENT LABS:  The patient had a uric acid level, which was normal. CBC showed WBC count of 10.9, hemoglobin of 13.5, hematocrit of 38.9, MCV of 102.1, and platelets of 145,000.  Comprehensive metabolic panel significant for sodium of 134, potassium of 2.9, glucose of 118.  LFTs within normal limits.  Procalcitonin and lactic acid are within normal limits.  Radiology none.ASSESSMENT AND PLAN:  This is a 70 year old gentleman who was at [facility] for alcohol detox was sent to [facility] ED for right ring finger swelling and erythema, most likely an acute gout flare versus  cellulitis with some component of cellulitis.  We will start the patient on colchicine 0.6 mg for 3 doses and also start the patient on indomethacin 50 mg p.o. daily for about a week.  We will get an x-ray of the right ring finger. We will also start the patient on clindamycin 300 mg q.8 h. for possible cellulitis:  Hand surgery consult was already called.  Mild leukocytosis, fever, most likely secondary to finger cellulitis.  Hypokalemia.  Potassium is being repleted at this time.  Hyponatremia.  It is very mild.  We will continue to monitor electrolytes in the morning.  Hypertension appears to be controlled.  We will continue the patient's medications of  metoprolol and amlodipine.  Thrombocytopenia chronic mild.  We will continue to monitor blood counts.  Alcohol abuse.  There are no signs and symptoms of withdrawal at this time.  We will continue to monitor.  GERD.  Start the patient on Prilosec 20 mg daily, also the patient's home medication, DVT prophylaxis subcutaneous Lovenox.  The patient is full code.          ______________________________ Kathlen Mody, MD     VA/MEDQ  D:  11/04/2010  T:  11/04/2010  Job:  [id number]  Electronically Signed by Kathlen Mody MD on 11/17/2010 09:00:59 PM
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
## 2010-11-30 NOTE — Discharge Summary (Signed)
NAME:  Dennis York, Dennis York NO.:  [id number]  MEDICAL RECORD NO.:  [id number]           PATIENT TYPE:  O  LOCATION:  1344                         FACILITY:  [facility]  PHYSICIAN:  Altha Harm, MDDATE OF BIRTH:  02/19/1941  DATE OF ADMISSION:  11/04/2010 DATE OF DISCHARGE:  11/07/2010                              DISCHARGE SUMMARY   DISCHARGE DISPOSITION:  Home.  FINAL DISCHARGE DIAGNOSES: 1. Acute gout of the right ring finger. 2. Alcohol habituation. 3. Hypertension. 4. History of coronary artery disease. 5. Gastroesophageal reflux disease. 6. Hyperlipidemia. 7. Thrombocytopenia. 8. Diarrhea, self-limited.  DISCHARGE MEDICATIONS: 1. Prednisone 40 mg p.o. daily for 3 days. 2. Amlodipine 5 mg p.o. daily. 3. Aspirin enteric coated 81 mg p.o. daily. 4. Metoprolol 12.5 mg p.o. b.i.d. 5. Prilosec 20 mg p.o. daily as needed for acid reflux.  CONSULTANTS:  Eulogio Ditch, MD, of Psychiatry.  PROCEDURES:  None.  DIAGNOSTIC STUDIES:  None.  PRIMARY CARE PHYSICIAN:  Dr. Arlyce Dice in [place]  ALLERGIES: 1. PENICILLIN. 2. BARBITURATES.  PERTINENT LABORATORY STUDIES:  Clostridium difficile PCR still pending at this time to be read prior to discharge.  HISTORY OF PRESENT ILLNESS:  Please refer to the H and P by Dr. Blake Divine for details of the HPI.  However, in short, this is a gentleman who had been at the inpatient behavioral health center for alcohol detoxification and was sent to [facility]  ED for right finger swelling and erythema for more than 2 days, associated with pain, fever, and chills.  HOSPITAL COURSE:  Acute gout of the right finger.  The patient presented with symptoms of inflammation of the finger.  It is unclear as to whether or not this is an infectious process rather arthritic inflammatory process.  The patient was started on clindamycin, however, developed diarrhea and thus the clindamycin was discontinued. Subsequently, the  patient was started on indomethacin and had a dramatic resolution of symptoms.  The patient was given indomethacin for 2 days. Although the patient does not at this time have renal insufficiency, I am uncomfortable with sending the patient out with indomethacin in light of the fact that he is an alcohol drinker and is at risk of habituating the alcohol use leading to dehydration.  Thus, I am substituting prednisone 40 mg daily for a total of 3 additional days for treatment of the acute gout.  The patient is to follow up with primary care physician, Dr. Arlyce Dice, for any further management of this condition. 1. Diarrhea.  The diarrhea was self-limited and may have been     associated with clindamycin is unclear.  Nevertheless, in light of     the fact that the patient had been on clindamycin and had developed     diarrhea, C difficile PCR was sent and is pending at this time.     The patient has had no further diarrhea.  If, in fact, the patient     is C difficile positive then he will be sent home on Flagyl. 2. Alcohol habituation.  The patient has gone to full detox at     inpatient behavioral health  center and has plan to go to [facility].  The patient will be discharged     home and then will proceed to [facility]. 3. Hypertension.  The patient's blood pressure is well controlled on     usual medications. 4. Hyperlipidemia.  The patient has a history of hyperlipidemia in his     records, however, cholesterol was not evaluated here as it did not     relate to the patient's acute admission.  I will refer to his     primary care physician to further manage this as an outpatient.  Condition at time of discharge is stable.  PHYSICAL EXAMINATION:  VITAL SIGNS:  Temperature is 97.5, heart rate 62, blood pressure 126/77, respiratory rate 20, O2 saturation is 99% on room. GENERAL:  The patient is well appearing.  He is dressed in his street clothes and ready  to leave the hospital. HEENT:  He is normocephalic, atraumatic.  Pupils equally round and reactive to light and accommodation.  Extraocular movements are intact. Oropharynx is moist.  No exudate, erythema, or lesions are noted. NECK:  Trachea is midline.  No masses.  No thyromegaly.  No JVD.  No carotid bruits. RESPIRATORY:  The patient has a normal respiratory effort, equal excursion bilaterally.  No wheezing or rhonchi noted.  CARDIOVASCULAR: He has got a normal S1 and S2.  No murmurs, rubs, or gallops noted.  PMI is nondisplaced.  No heaves or thrills on palpation. ABDOMEN:  Obese, soft, nontender, nondistended.  No masses, no hepatosplenomegaly is noted. EXTREMITIES:  No clubbing, cyanosis, or edema.  In particular, the patient's ring finger shows no erythema, warmth, or swelling at this time.  The patient has bunions on both of his legs and he states that at baseline he does have redness, however, there is no warmth or swelling around them at this time.  DIETARY RESTRICTIONS:  The patient should be on a heart-healthy diet and he should avoid red meat due to his propensity for gout.  PHYSICAL RESTRICTIONS:  Activity as tolerated.  Total time for this discharge process including face-to-face time approximately 40 minutes.     Altha Harm, MD     MAM/MEDQ  D:  11/07/2010  T:  11/08/2010  Job:  [id number]  Electronically Signed by Marthann Schiller MD on 11/30/2010 09:12:45 AM MedRecNo: [id number] [facility], Account: [id number], DocSeq: [id number] c.diff PCR negative. No diarrhea specific antibiotics initiated. Electronically Signed by Marthann Schiller MD on 11/30/2010 09:12:33 AM

## 2010-12-30 NOTE — Consult Note (Signed)
NAMETAIVEN, GREENLEY NO.:  [id number]  MEDICAL RECORD NO.:  [id number]  LOCATION:  1344                         FACILITY:  [facility]  PHYSICIAN:  Madelynn Done, MD  DATE OF BIRTH:  [birth year]  DATE OF CONSULTATION:  11/05/2010 DATE OF DISCHARGE:  11/07/2010                                CONSULTATION   REFERRING PHYSICIAN:  Kathlen Mody, MD  CHIEF COMPLAINT:  Right ring finger swelling.  BRIEF HISTORY:  This 70 year old gentleman with a history of hypertension, hyperlipidemia, CAD, alcohol abuse who was in [facility] for alcohol detox and was sent to the emergency department for right ring finger swelling and erythema.  The patient denied any other complaints.  The patient was concerned about the pain and the swelling in his finger.  No history of trauma or known history of bites or history of infection.  PAST MEDICAL HISTORY:  As mentioned above.  He does have history of gout.  PAST SURGICAL HISTORY:  Multiple knee replacements, cataract surgery, history of angioplasty.  MEDICATIONS:  See medical record.  ALLERGIES:  PENICILLIN and BARBITURATES.  SOCIAL HISTORY:  The patient is married.  Lives with his wife.  He has been trying to stop drinking alcohol.  PHYSICAL EXAMINATION:  VITAL SIGNS:  On examination, his vital signs are listed in the medical chart. GENERAL:  He is alert and oriented.  He is found to be in no acute distress. EXTREMITIES:  On examination of the right upper extremity, the patient does have a focal area of swelling directly over the proximal interphalangeal joint of the finger.  He has not had any open wounds or scars.  He does not appear to have any tophaceous gouty deposits and is mildly swollen.  There is no fluctuance.  He has relatively good mobility but is not full secondary to swelling.  He is able to make A-OK sign, cross fingers, extend the thumb, extend the digits.  His fingertips are warm, well  perfused, good capillary refill.  He has good wrist mobility.  Has no erythema or lymphangitis.  RADIOLOGIC:  His radiographs reviewed.  Did not show any acute osseous abnormality.  Pertinent labs do show a uric acid level which was normal.  White count of 10.9, hemoglobin 13.5.  IMPRESSION:  Right index finger cellulitis versus gouty arthropathy.  PLAN:  Today the findings were reviewed with Mr. Current.  Does not see any indication at the current time for surgical drainage.  The patient's presentation is more consistent with gout.  I will continue the medical treatment of gout for the current time.  If, however, his current condition declines or worsen, the patient may have superinfection on top of gout which may require further debridement and treatment.  We will continue to follow the patient while he is inpatient.  If he is discharged, he is going to come back and see me in the office.  All questions were answered and encouraged with Mr. Gunderson today.  No new medications were recommended.  The patient voiced understanding and we will continue watch him closely.  I do think that immobilization is helpful, this will allow the joint to calm down.  Madelynn Done, MD     FWO/MEDQ  D:  12/24/2010  T:  12/24/2010  Job:  [id number]  Electronically Signed by Bradly Bienenstock IV MD on 12/30/2010 08:40:51 PM

## 2011-01-22 ENCOUNTER — Other Ambulatory Visit: Payer: Self-pay | Admitting: Cardiology

## 2011-01-22 NOTE — Telephone Encounter (Signed)
escribe medication per fax request  

## 2011-02-20 ENCOUNTER — Other Ambulatory Visit: Payer: Self-pay | Admitting: *Deleted

## 2011-02-27 ENCOUNTER — Other Ambulatory Visit (INDEPENDENT_AMBULATORY_CARE_PROVIDER_SITE_OTHER): Payer: Medicare Other | Admitting: *Deleted

## 2011-02-27 ENCOUNTER — Other Ambulatory Visit: Payer: Self-pay | Admitting: Nurse Practitioner

## 2011-02-27 DIAGNOSIS — I10 Essential (primary) hypertension: Secondary | ICD-10-CM

## 2011-02-27 LAB — LDL CHOLESTEROL, DIRECT: Direct LDL: 136.3 mg/dL

## 2011-02-27 LAB — HEPATIC FUNCTION PANEL
ALT: 21 U/L (ref 0–53)
AST: 32 U/L (ref 0–37)
Albumin: 3.6 g/dL (ref 3.5–5.2)
Alkaline Phosphatase: 73 U/L (ref 39–117)
Bilirubin, Direct: 0.2 mg/dL (ref 0.0–0.3)
Total Bilirubin: 0.6 mg/dL (ref 0.3–1.2)
Total Protein: 7.6 g/dL (ref 6.0–8.3)

## 2011-02-27 LAB — BASIC METABOLIC PANEL
BUN: 10 mg/dL (ref 6–23)
CO2: 25 mEq/L (ref 19–32)
Calcium: 9 mg/dL (ref 8.4–10.5)
Chloride: 105 mEq/L (ref 96–112)
Creatinine, Ser: 0.7 mg/dL (ref 0.4–1.5)
GFR: 117.73 mL/min (ref >60.00)
Glucose, Bld: 98 mg/dL (ref 70–99)
Potassium: 3.9 mEq/L (ref 3.5–5.1)
Sodium: 139 mEq/L (ref 135–145)

## 2011-02-27 LAB — LIPID PANEL
Cholesterol: 211 mg/dL — ABNORMAL HIGH (ref 0–200)
HDL: 60.7 mg/dL (ref >39.00)
Total CHOL/HDL Ratio: 3
Triglycerides: 105 mg/dL (ref 0.0–149.0)
VLDL: 21 mg/dL (ref 0.0–40.0)

## 2011-02-28 ENCOUNTER — Encounter: Payer: Self-pay | Admitting: Nurse Practitioner

## 2011-02-28 ENCOUNTER — Ambulatory Visit (INDEPENDENT_AMBULATORY_CARE_PROVIDER_SITE_OTHER): Payer: Medicare Other | Admitting: Nurse Practitioner

## 2011-02-28 VITALS — BP 130/82 | HR 78 | Ht 69.0 in | Wt 189.2 lb

## 2011-02-28 DIAGNOSIS — I2510 Atherosclerotic heart disease of native coronary artery without angina pectoris: Secondary | ICD-10-CM | POA: Insufficient documentation

## 2011-02-28 DIAGNOSIS — I10 Essential (primary) hypertension: Secondary | ICD-10-CM

## 2011-02-28 DIAGNOSIS — E785 Hyperlipidemia, unspecified: Secondary | ICD-10-CM | POA: Insufficient documentation

## 2011-02-28 MED ORDER — SIMVASTATIN 20 MG PO TABS: 20.0000 mg | ORAL_TABLET | Freq: Every evening | ORAL | Status: DC

## 2011-02-28 NOTE — Patient Instructions (Signed)
Lets get back on some Zocor 20 mg daily. I sent a prescription to the drug store Lets recheck your labs in 6 weeks I will have you see Dr. Marca Ancona in 6 months Stay on your other medicines Call for any problems.

## 2011-02-28 NOTE — Assessment & Plan Note (Signed)
He is asymptomatic. I have left him on his current regimen except have added back his statin. We will see him back in 6 months. He will see Dr.McLean on return. Patient is agreeable to this plan and will call if any problems develop in the interim.

## 2011-02-28 NOTE — Assessment & Plan Note (Signed)
Blood pressure is ok. No change in his medicines.  

## 2011-02-28 NOTE — Progress Notes (Signed)
    Dennis York Date of Birth: May 22, 1941   History of Present Illness: Dennis York is seen back today for a 6 month check. He is seen for Dr. Shirlee Latch. He is a former patient of Dr. Ronnald Nian. From a cardiac standpoint he is doing well. No chest pain. No shortness of breath. He is drinking in very small quantities. He is off of his statin. Labs were checked yesterday and we will restart the Zocor. LDL was 133. He was able to tolerate it without problems. He had a normal nuclear this past March.  Current Outpatient Prescriptions on File Prior to Visit  Medication Sig Dispense Refill  . amLODipine (NORVASC) 5 MG tablet TAKE ONE (1) TABLET(S) ONCE DAILY  90 tablet  3  . aspirin 81 MG tablet Take 81 mg by mouth daily.        . metoprolol tartrate (LOPRESSOR) 25 MG tablet TAKE 1/2 TABLET(S) TWICE DAILY BETA BLOCKER  90 tablet  3  . omeprazole (PRILOSEC OTC) 20 MG tablet Take 20 mg by mouth as needed.          Allergies  Allergen Reactions  . Barbiturates     REACTION: Reaction not known  . Penicillins     REACTION: Reaction not known    Past Medical History  Diagnosis Date  . CAD (coronary artery disease)     Prior PCI to LAD in 1994  . Alcohol abuse, in remission   . Hyperlipidemia   . HTN (hypertension)   . Anxiety   . Normal nuclear stress test 2010    EF 77%    Past Surgical History  Procedure Date  . Replacement total knee Feb 2010  . Cardiac catheterization 01/30/1993    EF 55-60%  . US echocardiography 09/04/2005    EF 55-60%  . Cardiovascular stress test 09/12/2010    EF 68%    History  Smoking status  . Former Smoker  . Quit date: 02/28/1995  Smokeless tobacco  . Not on file    History  Alcohol Use  . Yes    History reviewed. No pertinent family history.  Review of Systems: The review of systems is as above.  All other systems were reviewed and are negative.  Physical Exam: BP 130/82  Pulse 78  Ht 5\' 9"  (1.753 m)  Wt 189 lb 3.2 oz (85.821 kg)  BMI  27.94 kg/m2 Patient is very pleasant and in no acute distress. Skin is warm and dry. Color is normal. He is suntanned. HEENT is unremarkable. Normocephalic/atraumatic. PERRL. Sclera are nonicteric. Neck is supple. No masses. No JVD. Lungs are clear. Cardiac exam shows a regular rate and rhythm. Abdomen is soft. Extremities are without edema. Gait and ROM are intact. No gross neurologic deficits noted.   LABORATORY DATA: Reviewed.   Assessment / Plan:

## 2011-02-28 NOTE — Assessment & Plan Note (Signed)
Labs are reviewed with him. He is willing to restart Zocor at 20 mg daily. We will recheck labs in 6 weeks and again in 6 months.

## 2011-03-06 NOTE — Progress Notes (Signed)
Restart statin, would like to see LDL < 70 with known CAD.  Dennis York [employer]

## 2011-04-18 ENCOUNTER — Telehealth: Payer: Self-pay | Admitting: Cardiology

## 2011-04-18 NOTE — Telephone Encounter (Signed)
Pt former pt of Dr. Deborah Chalk and last saw Lawson Fiscal. Pt wanted to speak with someone regarding the results of his blood test. Please return pt call to discuss further.

## 2011-04-18 NOTE — Telephone Encounter (Signed)
I talked with pt about lab done 02/27/11.

## 2011-06-06 ENCOUNTER — Telehealth: Payer: Self-pay | Admitting: Cardiology

## 2011-06-06 NOTE — Telephone Encounter (Signed)
Pt Called asking for Copy of Labs, He will Sign ROI and Pick up Labs, These have been left @ [employer] 06/06/11/km

## 2011-07-13 DIAGNOSIS — M545 Low back pain: Secondary | ICD-10-CM | POA: Diagnosis not present

## 2011-07-13 DIAGNOSIS — M1710 Unilateral primary osteoarthritis, unspecified knee: Secondary | ICD-10-CM | POA: Diagnosis not present

## 2011-08-09 ENCOUNTER — Encounter (HOSPITAL_COMMUNITY): Payer: Self-pay | Admitting: *Deleted

## 2011-08-09 ENCOUNTER — Emergency Department (HOSPITAL_COMMUNITY)
Admission: EM | Admit: 2011-08-09 | Discharge: 2011-08-09 | Disposition: A | Discharge status: Home / Self Care | Payer: Medicare Other | Attending: Emergency Medicine | Admitting: Emergency Medicine

## 2011-08-09 DIAGNOSIS — F411 Generalized anxiety disorder: Secondary | ICD-10-CM | POA: Insufficient documentation

## 2011-08-09 DIAGNOSIS — I10 Essential (primary) hypertension: Secondary | ICD-10-CM | POA: Insufficient documentation

## 2011-08-09 DIAGNOSIS — F419 Anxiety disorder, unspecified: Secondary | ICD-10-CM

## 2011-08-09 DIAGNOSIS — F1020 Alcohol dependence, uncomplicated: Secondary | ICD-10-CM

## 2011-08-09 DIAGNOSIS — F3289 Other specified depressive episodes: Secondary | ICD-10-CM | POA: Insufficient documentation

## 2011-08-09 DIAGNOSIS — Z79899 Other long term (current) drug therapy: Secondary | ICD-10-CM | POA: Insufficient documentation

## 2011-08-09 DIAGNOSIS — I2510 Atherosclerotic heart disease of native coronary artery without angina pectoris: Secondary | ICD-10-CM | POA: Insufficient documentation

## 2011-08-09 DIAGNOSIS — F10229 Alcohol dependence with intoxication, unspecified: Secondary | ICD-10-CM | POA: Insufficient documentation

## 2011-08-09 DIAGNOSIS — F329 Major depressive disorder, single episode, unspecified: Secondary | ICD-10-CM | POA: Insufficient documentation

## 2011-08-09 DIAGNOSIS — E785 Hyperlipidemia, unspecified: Secondary | ICD-10-CM | POA: Insufficient documentation

## 2011-08-09 HISTORY — DX: Depression, unspecified: F32.A

## 2011-08-09 HISTORY — DX: Major depressive disorder, single episode, unspecified: F32.9

## 2011-08-09 LAB — RAPID URINE DRUG SCREEN, HOSP PERFORMED
Amphetamines: NOT DETECTED
Barbiturates: NOT DETECTED
Benzodiazepines: NOT DETECTED
Cocaine: NOT DETECTED
Opiates: NOT DETECTED
Tetrahydrocannabinol: NOT DETECTED

## 2011-08-09 LAB — DIFFERENTIAL
Basophils Absolute: 0 10*3/uL (ref 0.0–0.1)
Basophils Relative: 1 % (ref 0–1)
Eosinophils Absolute: 0.1 K/uL (ref 0.0–0.7)
Eosinophils Relative: 2 % (ref 0–5)
Lymphocytes Relative: 52 % — ABNORMAL HIGH (ref 12–46)
Lymphs Abs: 2.5 10*3/uL (ref 0.7–4.0)
Monocytes Absolute: 0.4 K/uL (ref 0.1–1.0)
Monocytes Relative: 8 % (ref 3–12)
Neutro Abs: 1.7 10*3/uL (ref 1.7–7.7)
Neutrophils Relative %: 37 % — ABNORMAL LOW (ref 43–77)

## 2011-08-09 LAB — OCCULT BLOOD, POC DEVICE: Fecal Occult Bld: NEGATIVE

## 2011-08-09 LAB — COMPREHENSIVE METABOLIC PANEL WITH GFR
ALT: 47 U/L (ref 0–53)
Calcium: 8.7 mg/dL (ref 8.4–10.5)
GFR calc Af Amer: >90 mL/min (ref >90)
Glucose, Bld: 124 mg/dL — ABNORMAL HIGH (ref 70–99)
Sodium: 140 meq/L (ref 135–145)
Total Protein: 8.1 g/dL (ref 6.0–8.3)

## 2011-08-09 LAB — CBC
HCT: 39 % (ref 39.0–52.0)
Hemoglobin: 14.3 g/dL (ref 13.0–17.0)
MCH: 37.1 pg — ABNORMAL HIGH (ref 26.0–34.0)
MCHC: 36.7 g/dL — ABNORMAL HIGH (ref 30.0–36.0)
MCV: 101.3 fL — ABNORMAL HIGH (ref 78.0–100.0)
Platelets: 189 K/uL (ref 150–400)
RBC: 3.85 MIL/uL — ABNORMAL LOW (ref 4.22–5.81)
RDW: 13.5 % (ref 11.5–15.5)
WBC: 4.7 10*3/uL (ref 4.0–10.5)

## 2011-08-09 LAB — COMPREHENSIVE METABOLIC PANEL
AST: 112 U/L — ABNORMAL HIGH (ref 0–37)
Albumin: 3.5 g/dL (ref 3.5–5.2)
Alkaline Phosphatase: 124 U/L — ABNORMAL HIGH (ref 39–117)
BUN: 7 mg/dL (ref 6–23)
CO2: 23 mEq/L (ref 19–32)
Chloride: 103 mEq/L (ref 96–112)
Creatinine, Ser: 0.95 mg/dL (ref 0.50–1.35)
GFR calc non Af Amer: 81 mL/min — ABNORMAL LOW (ref >90)
Potassium: 3.4 mEq/L — ABNORMAL LOW (ref 3.5–5.1)
Total Bilirubin: 0.5 mg/dL (ref 0.3–1.2)

## 2011-08-09 LAB — ETHANOL: Alcohol, Ethyl (B): 122 mg/dL — ABNORMAL HIGH (ref 0–11)

## 2011-08-09 MED ORDER — ALUM & MAG HYDROXIDE-SIMETH 200-200-20 MG/5ML PO SUSP: 30.0000 mL | ORAL | Status: DC | PRN

## 2011-08-09 MED ORDER — LORAZEPAM 1 MG PO TABS
2.0000 mg | ORAL_TABLET | Freq: Once | ORAL | Status: AC
  Administered 2011-08-09: 2 mg via ORAL
  Filled 2011-08-09: qty 2

## 2011-08-09 MED ORDER — ONDANSETRON HCL 4 MG PO TABS: 4.0000 mg | ORAL_TABLET | Freq: Three times a day (TID) | ORAL | Status: DC | PRN

## 2011-08-09 MED ORDER — VITAMIN B-1 100 MG PO TABS
100.0000 mg | ORAL_TABLET | Freq: Once | ORAL | Status: AC
  Administered 2011-08-09: 100 mg via ORAL
  Filled 2011-08-09: qty 1

## 2011-08-09 MED ORDER — ADULT MULTIVITAMIN W/MINERALS CH
1.0000 | ORAL_TABLET | Freq: Every day | ORAL | Status: DC
  Administered 2011-08-09: 1 via ORAL
  Filled 2011-08-09: qty 1

## 2011-08-09 MED ORDER — LORAZEPAM 1 MG PO TABS: 1.0000 mg | ORAL_TABLET | Freq: Three times a day (TID) | ORAL | Status: DC | PRN

## 2011-08-09 MED ORDER — FOLIC ACID 1 MG PO TABS
1.0000 mg | ORAL_TABLET | Freq: Every day | ORAL | Status: DC
  Administered 2011-08-09: 1 mg via ORAL
  Filled 2011-08-09: qty 1

## 2011-08-09 NOTE — ED Notes (Signed)
Pt d/c home per MD orders
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
## 2011-08-09 NOTE — ED Notes (Signed)
Two patient belonging bags locked in locker #[id number]

## 2011-08-09 NOTE — ED Notes (Signed)
Pt's wallet to be locked in vault, placed in pt valuable envelope & accepted by [employer], awaiting AC.

## 2011-08-09 NOTE — BH Assessment (Signed)
Assessment Note   Dennis York is an 71 y.o. male. Patient presents due to problem with alcohol- beer use. which he does to suppress his anxiety disorder. Patient does not have a psychiatrist, has talked a psychologist in the past but not on a regular basis. Sts he last saw his psychologist 1.5 years ago.   He reports that he was diagnosed with agoraphobia in the past. He reports he drinks between 4 and 8 beers daily starting from about 9 AM until after dinner.  He did have some beer this morning. He told the EDP that he is interested in staying for detox treatment of the anxiety and depression as well as his alcohol dependence. During the assessment patient appears very nervous and un decisive about staying for treatment. He reports that he has been through detox in the past and unfortunately he was only treated for the alcohol dependence and was never treated for his anxiety. Writer asked patient if he is interested in staying for treatment potentially at [facility]. Patient declines offer and request to be discharged home. No SI or HI. No hallucinations, seizures. Pt given out-pt referrals.     Axis II: Alcohol Induced Mood Disorder, Anxiety Disorder NOS Axis III:799.9 Deferred Past Medical History  Diagnosis Date  . CAD (coronary artery disease)     Prior PCI to LAD in 1994  . Alcohol abuse, in remission   . Hyperlipidemia   . HTN (hypertension)   . Anxiety   . Normal nuclear stress test 2010    EF 77%  . Depression    Axis IV: economic problems, housing problems, other psychosocial or environmental problems, problems related to legal system/crime, problems related to social environment and problems with access to health care services Axis V: 51-60 moderate symptoms  Past Medical History:  Past Medical History  Diagnosis Date  . CAD (coronary artery disease)     Prior PCI to LAD in 1994  . Alcohol abuse, in remission   . Hyperlipidemia   . HTN (hypertension)   . Anxiety   . Normal  nuclear stress test 2010    EF 77%  . Depression     Past Surgical History  Procedure Date  . Replacement total knee Feb 2010  . Cardiac catheterization 01/30/1993    EF 55-60%  . US echocardiography 09/04/2005    EF 55-60%  . Cardiovascular stress test 09/12/2010    EF 68%    Family History: History reviewed. No pertinent family history.  Social History:  reports that he quit smoking about 16 years ago. He does not have any smokeless tobacco history on file. He reports that he drinks alcohol. He reports that he does not use illicit drugs.  Additional Social History:  Alcohol / Drug Use Pain Medications: see listed meds Prescriptions: see listed prescription meds noted by [facility] staff Over the Counter: see listed meds History of alcohol / drug use?: Yes Substance #1 Name of Substance 1: Alcohol-beer only 1 - Age of First Use: 21 1 - Amount (size/oz): 6-8 beers  1 - Frequency: daily 1 - Duration: "several yrs" 1 - Last Use / Amount: today 3 beers Allergies:  Allergies  Allergen Reactions  . Barbiturates     REACTION: Reaction not known  . Penicillins     REACTION: Reaction not known    Home Medications:  Medications Prior to Admission  Medication Dose Route Frequency Provider Last Rate Last Dose  . alum & mag hydroxide-simeth (MAALOX/MYLANTA) 200-200-20 MG/5ML suspension 30  mL  30 mL Oral PRN Gavin Pound. Ghim, MD      . folic acid (FOLVITE) tablet 1 mg  1 mg Oral Daily Gavin Pound. Ghim, MD   1 mg at 08/09/11 1207  . LORazepam (ATIVAN) tablet 1 mg  1 mg Oral Q8H PRN Gavin Pound. Ghim, MD      . LORazepam (ATIVAN) tablet 2 mg  2 mg Oral Once Gavin Pound. Ghim, MD   2 mg at 08/09/11 1207  . mulitivitamin with minerals tablet 1 tablet  1 tablet Oral Daily Gavin Pound. Ghim, MD   1 tablet at 08/09/11 1207  . ondansetron (ZOFRAN) tablet 4 mg  4 mg Oral Q8H PRN Gavin Pound. Ghim, MD      . thiamine (VITAMIN B-1) tablet 100 mg  100 mg Oral Once Gavin Pound. Ghim, MD   100 mg at 08/09/11 1207    Medications Prior to Admission  Medication Sig Dispense Refill  . amLODipine (NORVASC) 5 MG tablet TAKE ONE (1) TABLET(S) ONCE DAILY  90 tablet  3  . aspirin 81 MG tablet Take 81 mg by mouth daily.        . metoprolol tartrate (LOPRESSOR) 25 MG tablet TAKE 1/2 TABLET(S) TWICE DAILY BETA BLOCKER  90 tablet  3  . omeprazole (PRILOSEC OTC) 20 MG tablet Take 20 mg by mouth as needed.          OB/GYN Status:  No LMP for male patient.  General Assessment Data Location of Assessment: [facility] ACT Assessment: Yes Living Arrangements: Spouse/significant other Can pt return to current living arrangement?: Yes Admission Status: Voluntary Is patient capable of signing voluntary admission?: Yes Transfer from: Acute Hospital Referral Source: Self/Family/Friend     Risk to self Suicidal Ideation: No Suicidal Intent: No Is patient at risk for suicide?: No Suicidal Plan?: No Access to Means: No What has been your use of drugs/alcohol within the last 12 months?:  (alcohol-6-8 beers daily) Previous Attempts/Gestures: No How many times?:  (n/a) Other Self Harm Risks:  (n/a) Triggers for Past Attempts:  (n/a) Intentional Self Injurious Behavior: None Family Suicide History: No Recent stressful life event(s):  (on-going anxiety issues; no doctor) Persecutory voices/beliefs?: No Depression: No Substance abuse history and/or treatment for substance abuse?: Yes  Risk to Others Homicidal Ideation: No Thoughts of Harm to Others: No Current Homicidal Intent: No Current Homicidal Plan: No Access to Homicidal Means: No History of harm to others?: No Assessment of Violence: None Noted Violent Behavior Description:  (n/a) Does patient have access to weapons?: No Criminal Charges Pending?: No Does patient have a court date: No  Psychosis Hallucinations: None noted Delusions: None noted  Mental Status Report Appear/Hygiene: Disheveled Eye Contact: Poor Motor Activity: Unremarkable Speech:  Logical/coherent Level of Consciousness: Alert Mood: Depressed Affect: Depressed Anxiety Level: Severe Thought Processes: Relevant Judgement: Impaired Orientation: Person;Place;Time;Situation Obsessive Compulsive Thoughts/Behaviors: None  Cognitive Functioning Concentration: Decreased Memory: Remote Intact;Recent Intact IQ: Average Insight: Poor Impulse Control: Poor Appetite: Fair Weight Loss:  (n/a) Weight Gain:  (n/a) Sleep: No Change Total Hours of Sleep:  (n/a) Vegetative Symptoms: None  Prior Inpatient Therapy Prior Inpatient Therapy: Yes Prior Therapy Dates:  (March 2013) Prior Therapy Facilty/Provider(s):  [facility]) Reason for Treatment:  (alcohol detox)  Prior Outpatient Therapy Prior Outpatient Therapy: No Prior Therapy Dates:  (1.5 yrs ago) Prior Therapy Facilty/Provider(s):  [facility]) Reason for Treatment:  (depression)  ADL Screening (condition at time of admission) Patient's cognitive ability adequate to safely complete daily activities?: Yes  Patient able to express need for assistance with ADLs?: Yes Independently performs ADLs?: Yes Weakness of Legs: None Weakness of Arms/Hands: None  Home Assistive Devices/Equipment Home Assistive Devices/Equipment: None  Therapy Consults (therapy consults require a physician order) PT Evaluation Needed: No OT Evalulation Needed: No SLP Evaluation Needed: No Abuse/Neglect Assessment (Assessment to be complete while patient is alone) Physical Abuse: Denies Verbal Abuse: Denies Sexual Abuse: Denies Exploitation of patient/patient's resources: Denies Self-Neglect: Denies Values / Beliefs Cultural Requests During Hospitalization: None Spiritual Requests During Hospitalization: None   Advance Directives (For Healthcare) Advance Directive: Patient does not have advance directive Nutrition Screen Diet: Regular Unintentional weight loss greater than 10lbs within the last month: No Dysphagia: No Home Tube Feeding or  Total Parenteral Nutrition (TPN): No Patient appears severely malnourished: No Pregnant or Lactating: No  Additional Information 1:1 In Past 12 Months?: No CIRT Risk: No Elopement Risk: No Does patient have medical clearance?: Yes     Disposition:  Disposition Disposition of Patient: Referred to;Outpatient treatment (Pt given out-pt referrals for follow up treatment.)  Pt offered and refused in-pt services.  On Site Evaluation by:   Reviewed with Physician:     Melynda Ripple [facility] 08/09/2011 2:01 PM

## 2011-08-09 NOTE — ED Notes (Signed)
Dr. Oletta Lamas in with pt.

## 2011-08-09 NOTE — ED Provider Notes (Signed)
History     CSN: [id number]  Arrival date & time 08/09/11  0846   First MD Initiated Contact with Patient 08/09/11 [phone]      Chief Complaint  Patient presents with  . Anxiety  . Depression  . Alcohol Intoxication    (Consider location/radiation/quality/duration/timing/severity/associated sxs/prior treatment) HPI Comments: Patient presents to do to a problem of constantly drinking beer which he does to suppress his anxiety disorder. Patient does not have a psychiatrist, has talked a psychologist in the past but not on a regular basis. Patient also used to have a family doctor, Dr. Arlyce Dice who has since left and retired. He has not been assigned to a new physician that he knows of. The patient has a history of coronary disease and hypertension. He reports that he was diagnosed with ago for phobia and the past. He reports he drinks between 4 and 8 beers daily starting from about 9 AM until after dinner. He denies fever chills, cold symptoms, cough, chest pain, shortness of breath, sweats. He denies any night sweats or unexplained weight loss. He reports that he has had a little bit of rectal bleeding intermittently for a few days. He does have a history of hemorrhoids. He reports he had a colonoscopy recently and did have a few polyps but no tumors and no evidence of infection. He did have some beer this morning. He is interested in staying for both treatment of the anxiety and depression as well as his alcohol dependence. He reports that he has been through detox in the past and unfortunately he was only treated for the alcohol dependence and was never treated for his anxiety. No SI or HI.  No hallucinations, seizures.    Patient is a 71 y.o. male presenting with anxiety and intoxication. The history is provided by the patient.  Anxiety  Alcohol Intoxication    Past Medical History  Diagnosis Date  . CAD (coronary artery disease)     Prior PCI to LAD in 1994  . Alcohol abuse, in remission     . Hyperlipidemia   . HTN (hypertension)   . Anxiety   . Normal nuclear stress test 2010    EF 77%  . Depression     Past Surgical History  Procedure Date  . Replacement total knee Feb 2010  . Cardiac catheterization 01/30/1993    EF 55-60%  . US echocardiography 09/04/2005    EF 55-60%  . Cardiovascular stress test 09/12/2010    EF 68%    History reviewed. No pertinent family history.  History  Substance Use Topics  . Smoking status: Former Smoker    Quit date: 02/28/1995  . Smokeless tobacco: Not on file  . Alcohol Use: Yes      Review of Systems  All other systems reviewed and are negative.    Allergies  Barbiturates and Penicillins  Home Medications   Current Outpatient Rx  Name Route Sig Dispense Refill  . AMLODIPINE BESYLATE 5 MG PO TABS  TAKE ONE (1) TABLET(S) ONCE DAILY 90 tablet 3  . ASPIRIN 81 MG PO TABS Oral Take 81 mg by mouth daily.      Marland Kitchen METOPROLOL TARTRATE 25 MG PO TABS  TAKE 1/2 TABLET(S) TWICE DAILY BETA BLOCKER 90 tablet 3    Dr. Deborah Chalk has retired. Further refills will be b ...  . OMEPRAZOLE MAGNESIUM 20 MG PO TBEC Oral Take 20 mg by mouth as needed.      . TETRAHYDROZOLINE HCL 0.05 % OP  SOLN Both Eyes Place 1 drop into both eyes daily.      BP 127/90  Pulse 90  Temp(Src) 98.2 F (36.8 C) (Oral)  Resp 16  Ht 5\' 9"  (1.753 m)  Wt 190 lb (86.183 kg)  BMI 28.06 kg/m2  SpO2 98%  Physical Exam  Nursing note and vitals reviewed. Constitutional: He is oriented to person, place, and time. He appears well-developed and well-nourished.  HENT:  Head: Normocephalic and atraumatic.  Eyes: Pupils are equal, round, and reactive to light.  Neck: Normal range of motion. Neck supple.  Cardiovascular: Normal rate.   Pulmonary/Chest: Effort normal. He has no wheezes. He has no rales.  Abdominal: Soft. He exhibits no distension. There is no tenderness.  Genitourinary:       Rectal exam is unremarkable. No obvious hemorrhoids are seen. Stool color is  normal. There is no tenderness in time is normal. Chaperone was present  Musculoskeletal: Normal range of motion.  Neurological: He is alert and oriented to person, place, and time.  Skin: Skin is warm and dry.  Psychiatric: He has a normal mood and affect.    ED Course  Procedures (including critical care time)  Labs Reviewed  ETHANOL - Abnormal; Notable for the following:    Alcohol, Ethyl (B) 122 (*)    All other components within normal limits  CBC - Abnormal; Notable for the following:    RBC 3.85 (*)    MCV 101.3 (*)    MCH 37.1 (*)    MCHC 36.7 (*)    All other components within normal limits  DIFFERENTIAL - Abnormal; Notable for the following:    Neutrophils Relative 37 (*)    Lymphocytes Relative 52 (*)    All other components within normal limits  COMPREHENSIVE METABOLIC PANEL - Abnormal; Notable for the following:    Potassium 3.4 (*)    Glucose, Bld 124 (*)    AST 112 (*) MODERATE HEMOLYSIS   Alkaline Phosphatase 124 (*)    GFR calc non Af Amer 81 (*)    All other components within normal limits  URINE RAPID DRUG SCREEN (HOSP PERFORMED)  OCCULT BLOOD, POC DEVICE   No results found.   1. Alcoholism   2. Anxiety     Vital signs are normal. Room air saturations are 98% which is normal.  MDM    patient is here on a voluntary bas the patient's labs suggests mild alcoholic hepatitis secondary to his alcoholism. I anticipate that his liver function test would normalize with improvement in his alcoholism. We will keep him comfortable with oral Ativan. I will consult act in order to find him a reasonable facility that can both facilitate his detoxification from his ankle abuse but also secondarily help him with depression and anxiety. His rectal exam showed no prostate tenderness, no prostate enlargement, normal colored stool that was Hemoccult negative.      12:44 PM Pt is reportedly to be very anxious and now feels he wishes to go home.  He is encouraged to  stay.  He is told that if he returns, he will have to go through same process.  He is offered, food, more medications.  He does not desire at this time.  Will wait until his family member who is also a patient in the ED is released.     1:27 PM Pt has changed his mind and wishes to go home.  He is urged multiple times by myself and ACT to stay.  He  has no SI, HI and is not psychotic.  Pt reports he wants to try to find a psychiatrist which ACT will provide and he will work on joining an outpt program for alcoholism.    Gavin Pound. Shakevia Sarris, MD 08/09/11 1327

## 2011-08-09 NOTE — Discharge Instructions (Signed)
 Finding Treatment for Alcohol  and Drug Addiction It can be hard to find the right place to get professional treatment. Here are some important things to consider:  There are different types of treatment to choose from.   Some programs are live-in (residential) while others are not (outpatient). Sometimes a combination is offered.   No single type of program is right for everyone.   Most treatment programs involve a combination of education, counseling, and a 12-step, spiritually-based approach.   There are non-spiritually based programs (not 12-step).   Some treatment programs are government sponsored. They are geared for patients without private insurance.   Treatment programs can vary in many respects such as:   Cost and types of insurance accepted.   Types of on-site medical services offered.   Length of stay, setting, and size.   Overall philosophy of treatment.  A person may need specialized treatment or have needs not addressed by all programs. For example, adolescents need treatment appropriate for their age. Other people have secondary disorders that must be managed as well. Secondary conditions can include mental illness, such as depression or diabetes. Often, a period of detoxification from alcohol  or drugs is needed. This requires medical supervision and not all programs offer this. THINGS TO CONSIDER WHEN SELECTING A TREATMENT PROGRAM   Is the program certified by the appropriate government agency? Even private programs must be certified and employ certified professionals.   Does the program accept your insurance? If not, can a payment plan be set up?   Is the facility clean, organized, and well run? Do they allow you to speak with graduates who can share their treatment experience with you? Can you tour the facility? Can you meet with staff?   Does the program meet the full range of individual needs?   Does the treatment program address sexual orientation and physical  disabilities? Do they provide age, gender, and culturally appropriate treatment services?   Is treatment available in languages other than English?   Is long-term aftercare support or guidance encouraged and provided?   Is assessment of an individual's treatment plan ongoing to ensure it meets changing needs?   Does the program use strategies to encourage reluctant patients to remain in treatment long enough to increase the likelihood of success?   Does the program offer counseling (individual or group) and other behavioral therapies?   Does the program offer medicine as part of the treatment regimen, if needed?   Is there ongoing monitoring of possible relapse? Is there a defined relapse prevention program? Are services or referrals offered to family members to ensure they understand addiction and the recovery process? This would help them support the recovering individual.   Are 12-step meetings held at the center or is transport available for patients to attend outside meetings?  In countries outside of the [place]. and [place], Magazine features editor for contact information for services in your area. Document Released: 05/17/2005 Document Revised: 02/28/2011 Document Reviewed: 11/27/2007 [employer] Patient Information 2012 [place], [place].    Alcohol  Withdrawal Anytime drug use is interfering with normal living activities it has become abuse. This includes problems with family and friends. Psychological dependence has developed when your mind tells you that the drug is needed. This is usually followed by physical dependence when a continuing increase of drugs are required to get the same feeling or high. This is known as addiction or chemical dependency. A person's risk is much higher if there is a history of chemical dependency in the  family. Mild Withdrawal Following Stopping Alcohol , When Addiction or Chemical Dependency Has Developed When a person has developed tolerance to alcohol , any  sudden stopping of alcohol  can cause uncomfortable physical symptoms. Most of the time these are mild and consist of tremors in the hands and increases in heart rate, breathing, and temperature. Sometimes these symptoms are associated with anxiety, panic attacks, and bad dreams. There may also be stomach upset. Normal sleep patterns are often interrupted with periods of inability to sleep (insomnia). This may last for 6 months. Because of this discomfort, many people choose to continue drinking to get rid of this discomfort and to try to feel normal. Severe Withdrawal with Decreased or No Alcohol  Intake, When Addiction or Chemical Dependency Has Developed About five percent of alcoholics will develop signs of severe withdrawal when they stop using alcohol . One sign of this is development of generalized seizures (convulsions). Other signs of this are severe agitation and confusion. This may be associated with believing in things which are not real or seeing things which are not really there (delusions and hallucinations). Vitamin deficiencies are usually present if alcohol  intake has been long-term. Treatment for this most often requires hospitalization and close observation. Addiction can only be helped by stopping use of all chemicals. This is hard but may save your life. With continual alcohol  use, possible outcomes are usually loss of self respect and esteem, violence, and death. Addiction cannot be cured but it can be stopped. This often requires outside help and the care of professionals. Treatment centers are listed in the yellow pages under Cocaine, Narcotics, and Alcoholics Anonymous. Most hospitals and clinics can refer you to a specialized care center. It is not necessary for you to go through the uncomfortable symptoms of withdrawal. Your caregiver can provide you with medicines that will help you through this difficult period. Try to avoid situations, friends, or drugs that made it possible for you  to keep using alcohol  in the past. Learn how to say no. It takes a long period of time to overcome addictions to all drugs, including alcohol . There may be many times when you feel as though you want a drink. After getting rid of the physical addiction and withdrawal, you will have a lessening of the craving which tells you that you need alcohol  to feel normal. Call your caregiver if more support is needed. Learn who to talk to in your family and among your friends so that during these periods you can receive outside help. Alcoholics Anonymous (AA) has helped many people over the years. To get further help, contact AA or call your caregiver, counselor, or clergyperson. Al-Anon and Alateen are support groups for friends and family members of an alcoholic. The people who love and care for an alcoholic often need help, too. For information about these organizations, check your phone directory or call a local alcoholism treatment center.  SEEK IMMEDIATE MEDICAL CARE IF:   You have a seizure.   You have a fever.   You experience uncontrolled vomiting or you vomit up blood. This may be bright red or look like black coffee grounds.   You have blood in the stool. This may be bright red or appear as a black, tarry, bad-smelling stool.   You become lightheaded or faint. Do not drive if you feel this way. Have someone else drive you or call [phone] for help.   You become more agitated or confused.   You develop uncontrolled anxiety.   You begin to  see things that are not really there (hallucinate).  Your caregiver has determined that you completely understand your medical condition, and that your mental state is back to normal. You understand that you have been treated for alcohol  withdrawal, have agreed not to drink any alcohol  for a minimum of 1 day, will not operate a car or other machinery for 24 hours, and have had an opportunity to ask any questions about your condition. Document Released: 03/28/2005  Document Revised: 02/28/2011 Document Reviewed: 02/04/2008 [facility] Patient Information 2012 [place], [place].    Anxiety and Panic Attacks Your caregiver has informed you that you are having an anxiety or panic attack. There may be many forms of this. Most of the time these attacks come suddenly and without warning. They come at any time of day, including periods of sleep, and at any time of life. They may be strong and unexplained. Although panic attacks are very scary, they are physically harmless. Sometimes the cause of your anxiety is not known. Anxiety is a protective mechanism of the body in its fight or flight mechanism. Most of these perceived danger situations are actually nonphysical situations (such as anxiety over losing a job). CAUSES  The causes of an anxiety or panic attack are many. Panic attacks may occur in otherwise healthy people given a certain set of circumstances. There may be a genetic cause for panic attacks. Some medications may also have anxiety as a side effect. SYMPTOMS  Some of the most common feelings are:  Intense terror.   Dizziness, feeling faint.   Hot and cold flashes.   Fear of going crazy.   Feelings that nothing is real.   Sweating.   Shaking.   Chest pain or a fast heartbeat (palpitations).   Smothering, choking sensations.   Feelings of impending doom and that death is near.   Tingling of extremities, this may be from over-breathing.   Altered reality (derealization).   Being detached from yourself (depersonalization).  Several symptoms can be present to make up anxiety or panic attacks. DIAGNOSIS  The evaluation by your caregiver will depend on the type of symptoms you are experiencing. The diagnosis of anxiety or panic attack is made when no physical illness can be determined to be a cause of the symptoms. TREATMENT  Treatment to prevent anxiety and panic attacks may include:  Avoidance of circumstances that cause anxiety.    Reassurance and relaxation.   Regular exercise.   Relaxation therapies, such as yoga.   Psychotherapy with a psychiatrist or therapist.   Avoidance of caffeine, alcohol  and illegal drugs.   Prescribed medication.  SEEK IMMEDIATE MEDICAL CARE IF:   You experience panic attack symptoms that are different than your usual symptoms.   You have any worsening or concerning symptoms.  Document Released: 06/18/2005 Document Revised: 02/28/2011 Document Reviewed: 10/20/2009 [facility] Patient Information 2012 [place], [place].Anxiety and Panic Attacks Your caregiver has informed you that you are having an anxiety or panic attack. There may be many forms of this. Most of the time these attacks come suddenly and without warning. They come at any time of day, including periods of sleep, and at any time of life. They may be strong and unexplained. Although panic attacks are very scary, they are physically harmless. Sometimes the cause of your anxiety is not known. Anxiety is a protective mechanism of the body in its fight or flight mechanism. Most of these perceived danger situations are actually nonphysical situations (such as anxiety over losing a job). CAUSES  The causes of an anxiety or panic attack are many. Panic attacks may occur in otherwise healthy people given a certain set of circumstances. There may be a genetic cause for panic attacks. Some medications may also have anxiety as a side effect. SYMPTOMS  Some of the most common feelings are:  Intense terror.   Dizziness, feeling faint.   Hot and cold flashes.   Fear of going crazy.   Feelings that nothing is real.   Sweating.   Shaking.   Chest pain or a fast heartbeat (palpitations).   Smothering, choking sensations.   Feelings of impending doom and that death is near.   Tingling of extremities, this may be from over-breathing.   Altered reality (derealization).   Being detached from yourself (depersonalization).   Several symptoms can be present to make up anxiety or panic attacks. DIAGNOSIS  The evaluation by your caregiver will depend on the type of symptoms you are experiencing. The diagnosis of anxiety or panic attack is made when no physical illness can be determined to be a cause of the symptoms. TREATMENT  Treatment to prevent anxiety and panic attacks may include:  Avoidance of circumstances that cause anxiety.   Reassurance and relaxation.   Regular exercise.   Relaxation therapies, such as yoga.   Psychotherapy with a psychiatrist or therapist.   Avoidance of caffeine, alcohol  and illegal drugs.   Prescribed medication.  SEEK IMMEDIATE MEDICAL CARE IF:   You experience panic attack symptoms that are different than your usual symptoms.   You have any worsening or concerning symptoms.  Document Released: 06/18/2005 Document Revised: 02/28/2011 Document Reviewed: 10/20/2009 [facility] Patient Information 2012 [place], [place].Anxiety and Panic Attacks Your caregiver has informed you that you are having an anxiety or panic attack. There may be many forms of this. Most of the time these attacks come suddenly and without warning. They come at any time of day, including periods of sleep, and at any time of life. They may be strong and unexplained. Although panic attacks are very scary, they are physically harmless. Sometimes the cause of your anxiety is not known. Anxiety is a protective mechanism of the body in its fight or flight mechanism. Most of these perceived danger situations are actually nonphysical situations (such as anxiety over losing a job). CAUSES  The causes of an anxiety or panic attack are many. Panic attacks may occur in otherwise healthy people given a certain set of circumstances. There may be a genetic cause for panic attacks. Some medications may also have anxiety as a side effect. SYMPTOMS  Some of the most common feelings are:  Intense terror.   Dizziness, feeling  faint.   Hot and cold flashes.   Fear of going crazy.   Feelings that nothing is real.   Sweating.   Shaking.   Chest pain or a fast heartbeat (palpitations).   Smothering, choking sensations.   Feelings of impending doom and that death is near.   Tingling of extremities, this may be from over-breathing.   Altered reality (derealization).   Being detached from yourself (depersonalization).  Several symptoms can be present to make up anxiety or panic attacks. DIAGNOSIS  The evaluation by your caregiver will depend on the type of symptoms you are experiencing. The diagnosis of anxiety or panic attack is made when no physical illness can be determined to be a cause of the symptoms. TREATMENT  Treatment to prevent anxiety and panic attacks may include:  Avoidance of circumstances that cause anxiety.  Reassurance and relaxation.   Regular exercise.   Relaxation therapies, such as yoga.   Psychotherapy with a psychiatrist or therapist.   Avoidance of caffeine, alcohol  and illegal drugs.   Prescribed medication.  SEEK IMMEDIATE MEDICAL CARE IF:   You experience panic attack symptoms that are different than your usual symptoms.   You have any worsening or concerning symptoms.  Document Released: 06/18/2005 Document Revised: 02/28/2011 Document Reviewed: 10/20/2009 [facility] Patient Information 2012 [place], [place].Anxiety and Panic Attacks Your caregiver has informed you that you are having an anxiety or panic attack. There may be many forms of this. Most of the time these attacks come suddenly and without warning. They come at any time of day, including periods of sleep, and at any time of life. They may be strong and unexplained. Although panic attacks are very scary, they are physically harmless. Sometimes the cause of your anxiety is not known. Anxiety is a protective mechanism of the body in its fight or flight mechanism. Most of these perceived danger situations are  actually nonphysical situations (such as anxiety over losing a job). CAUSES  The causes of an anxiety or panic attack are many. Panic attacks may occur in otherwise healthy people given a certain set of circumstances. There may be a genetic cause for panic attacks. Some medications may also have anxiety as a side effect. SYMPTOMS  Some of the most common feelings are:  Intense terror.   Dizziness, feeling faint.   Hot and cold flashes.   Fear of going crazy.   Feelings that nothing is real.   Sweating.   Shaking.   Chest pain or a fast heartbeat (palpitations).   Smothering, choking sensations.   Feelings of impending doom and that death is near.   Tingling of extremities, this may be from over-breathing.   Altered reality (derealization).   Being detached from yourself (depersonalization).  Several symptoms can be present to make up anxiety or panic attacks. DIAGNOSIS  The evaluation by your caregiver will depend on the type of symptoms you are experiencing. The diagnosis of anxiety or panic attack is made when no physical illness can be determined to be a cause of the symptoms. TREATMENT  Treatment to prevent anxiety and panic attacks may include:  Avoidance of circumstances that cause anxiety.   Reassurance and relaxation.   Regular exercise.   Relaxation therapies, such as yoga.   Psychotherapy with a psychiatrist or therapist.   Avoidance of caffeine, alcohol  and illegal drugs.   Prescribed medication.  SEEK IMMEDIATE MEDICAL CARE IF:   You experience panic attack symptoms that are different than your usual symptoms.   You have any worsening or concerning symptoms.  Document Released: 06/18/2005 Document Revised: 02/28/2011 Document Reviewed: 10/20/2009 [facility] Patient Information 2012 [place], [place].Anxiety and Panic Attacks Your caregiver has informed you that you are having an anxiety or panic attack. There may be many forms of this. Most of the time  these attacks come suddenly and without warning. They come at any time of day, including periods of sleep, and at any time of life. They may be strong and unexplained. Although panic attacks are very scary, they are physically harmless. Sometimes the cause of your anxiety is not known. Anxiety is a protective mechanism of the body in its fight or flight mechanism. Most of these perceived danger situations are actually nonphysical situations (such as anxiety over losing a job). CAUSES  The causes of an anxiety or panic attack are many. Panic attacks may  occur in otherwise healthy people given a certain set of circumstances. There may be a genetic cause for panic attacks. Some medications may also have anxiety as a side effect. SYMPTOMS  Some of the most common feelings are:  Intense terror.   Dizziness, feeling faint.   Hot and cold flashes.   Fear of going crazy.   Feelings that nothing is real.   Sweating.   Shaking.   Chest pain or a fast heartbeat (palpitations).   Smothering, choking sensations.   Feelings of impending doom and that death is near.   Tingling of extremities, this may be from over-breathing.   Altered reality (derealization).   Being detached from yourself (depersonalization).  Several symptoms can be present to make up anxiety or panic attacks. DIAGNOSIS  The evaluation by your caregiver will depend on the type of symptoms you are experiencing. The diagnosis of anxiety or panic attack is made when no physical illness can be determined to be a cause of the symptoms. TREATMENT  Treatment to prevent anxiety and panic attacks may include:  Avoidance of circumstances that cause anxiety.   Reassurance and relaxation.   Regular exercise.   Relaxation therapies, such as yoga.   Psychotherapy with a psychiatrist or therapist.   Avoidance of caffeine, alcohol  and illegal drugs.   Prescribed medication.  SEEK IMMEDIATE MEDICAL CARE IF:   You experience  panic attack symptoms that are different than your usual symptoms.   You have any worsening or concerning symptoms.  Document Released: 06/18/2005 Document Revised: 02/28/2011 Document Reviewed: 10/20/2009 [facility] Patient Information 2012 [place], [place].Anxiety and Panic Attacks Your caregiver has informed you that you are having an anxiety or panic attack. There may be many forms of this. Most of the time these attacks come suddenly and without warning. They come at any time of day, including periods of sleep, and at any time of life. They may be strong and unexplained. Although panic attacks are very scary, they are physically harmless. Sometimes the cause of your anxiety is not known. Anxiety is a protective mechanism of the body in its fight or flight mechanism. Most of these perceived danger situations are actually nonphysical situations (such as anxiety over losing a job). CAUSES  The causes of an anxiety or panic attack are many. Panic attacks may occur in otherwise healthy people given a certain set of circumstances. There may be a genetic cause for panic attacks. Some medications may also have anxiety as a side effect. SYMPTOMS  Some of the most common feelings are:  Intense terror.   Dizziness, feeling faint.   Hot and cold flashes.   Fear of going crazy.   Feelings that nothing is real.   Sweating.   Shaking.   Chest pain or a fast heartbeat (palpitations).   Smothering, choking sensations.   Feelings of impending doom and that death is near.   Tingling of extremities, this may be from over-breathing.   Altered reality (derealization).   Being detached from yourself (depersonalization).  Several symptoms can be present to make up anxiety or panic attacks. DIAGNOSIS  The evaluation by your caregiver will depend on the type of symptoms you are experiencing. The diagnosis of anxiety or panic attack is made when no physical illness can be determined to be a cause of the  symptoms. TREATMENT  Treatment to prevent anxiety and panic attacks may include:  Avoidance of circumstances that cause anxiety.   Reassurance and relaxation.   Regular exercise.   Relaxation therapies,  such as yoga.   Psychotherapy with a psychiatrist or therapist.   Avoidance of caffeine, alcohol  and illegal drugs.   Prescribed medication.  SEEK IMMEDIATE MEDICAL CARE IF:   You experience panic attack symptoms that are different than your usual symptoms.   You have any worsening or concerning symptoms.  Document Released: 06/18/2005 Document Revised: 02/28/2011 Document Reviewed: 10/20/2009 [facility] Patient Information 2012 [place], [place].Anxiety and Panic Attacks Your caregiver has informed you that you are having an anxiety or panic attack. There may be many forms of this. Most of the time these attacks come suddenly and without warning. They come at any time of day, including periods of sleep, and at any time of life. They may be strong and unexplained. Although panic attacks are very scary, they are physically harmless. Sometimes the cause of your anxiety is not known. Anxiety is a protective mechanism of the body in its fight or flight mechanism. Most of these perceived danger situations are actually nonphysical situations (such as anxiety over losing a job). CAUSES  The causes of an anxiety or panic attack are many. Panic attacks may occur in otherwise healthy people given a certain set of circumstances. There may be a genetic cause for panic attacks. Some medications may also have anxiety as a side effect. SYMPTOMS  Some of the most common feelings are:  Intense terror.   Dizziness, feeling faint.   Hot and cold flashes.   Fear of going crazy.   Feelings that nothing is real.   Sweating.   Shaking.   Chest pain or a fast heartbeat (palpitations).   Smothering, choking sensations.   Feelings of impending doom and that death is near.   Tingling of  extremities, this may be from over-breathing.   Altered reality (derealization).   Being detached from yourself (depersonalization).  Several symptoms can be present to make up anxiety or panic attacks. DIAGNOSIS  The evaluation by your caregiver will depend on the type of symptoms you are experiencing. The diagnosis of anxiety or panic attack is made when no physical illness can be determined to be a cause of the symptoms. TREATMENT  Treatment to prevent anxiety and panic attacks may include:  Avoidance of circumstances that cause anxiety.   Reassurance and relaxation.   Regular exercise.   Relaxation therapies, such as yoga.   Psychotherapy with a psychiatrist or therapist.   Avoidance of caffeine, alcohol  and illegal drugs.   Prescribed medication.  SEEK IMMEDIATE MEDICAL CARE IF:   You experience panic attack symptoms that are different than your usual symptoms.   You have any worsening or concerning symptoms.  Document Released: 06/18/2005 Document Revised: 02/28/2011 Document Reviewed: 10/20/2009 [facility] Patient Information 2012 [place], [place].Anxiety and Panic Attacks Your caregiver has informed you that you are having an anxiety or panic attack. There may be many forms of this. Most of the time these attacks come suddenly and without warning. They come at any time of day, including periods of sleep, and at any time of life. They may be strong and unexplained. Although panic attacks are very scary, they are physically harmless. Sometimes the cause of your anxiety is not known. Anxiety is a protective mechanism of the body in its fight or flight mechanism. Most of these perceived danger situations are actually nonphysical situations (such as anxiety over losing a job). CAUSES  The causes of an anxiety or panic attack are many. Panic attacks may occur in otherwise healthy people given a certain set of circumstances. There  may be a genetic cause for panic attacks. Some  medications may also have anxiety as a side effect. SYMPTOMS  Some of the most common feelings are:  Intense terror.   Dizziness, feeling faint.   Hot and cold flashes.   Fear of going crazy.   Feelings that nothing is real.   Sweating.   Shaking.   Chest pain or a fast heartbeat (palpitations).   Smothering, choking sensations.   Feelings of impending doom and that death is near.   Tingling of extremities, this may be from over-breathing.   Altered reality (derealization).   Being detached from yourself (depersonalization).  Several symptoms can be present to make up anxiety or panic attacks. DIAGNOSIS  The evaluation by your caregiver will depend on the type of symptoms you are experiencing. The diagnosis of anxiety or panic attack is made when no physical illness can be determined to be a cause of the symptoms. TREATMENT  Treatment to prevent anxiety and panic attacks may include:  Avoidance of circumstances that cause anxiety.   Reassurance and relaxation.   Regular exercise.   Relaxation therapies, such as yoga.   Psychotherapy with a psychiatrist or therapist.   Avoidance of caffeine, alcohol  and illegal drugs.   Prescribed medication.  SEEK IMMEDIATE MEDICAL CARE IF:   You experience panic attack symptoms that are different than your usual symptoms.   You have any worsening or concerning symptoms.  Document Released: 06/18/2005 Document Revised: 02/28/2011 Document Reviewed: 10/20/2009 [facility] Patient Information 2012 [place], [place].

## 2011-08-24 ENCOUNTER — Encounter (HOSPITAL_COMMUNITY): Payer: Self-pay | Admitting: *Deleted

## 2011-08-24 ENCOUNTER — Other Ambulatory Visit: Payer: Self-pay

## 2011-08-24 ENCOUNTER — Emergency Department (HOSPITAL_COMMUNITY)
Admission: EM | Admit: 2011-08-24 | Discharge: 2011-08-25 | Disposition: A | Discharge status: Other Acute Inpatient Hospital | Payer: Medicare Other | Source: Home / Self Care | Attending: Emergency Medicine | Admitting: Emergency Medicine

## 2011-08-24 DIAGNOSIS — F329 Major depressive disorder, single episode, unspecified: Secondary | ICD-10-CM | POA: Insufficient documentation

## 2011-08-24 DIAGNOSIS — R079 Chest pain, unspecified: Secondary | ICD-10-CM | POA: Insufficient documentation

## 2011-08-24 DIAGNOSIS — I10 Essential (primary) hypertension: Secondary | ICD-10-CM | POA: Insufficient documentation

## 2011-08-24 DIAGNOSIS — I2510 Atherosclerotic heart disease of native coronary artery without angina pectoris: Secondary | ICD-10-CM | POA: Insufficient documentation

## 2011-08-24 DIAGNOSIS — F3289 Other specified depressive episodes: Secondary | ICD-10-CM | POA: Insufficient documentation

## 2011-08-24 DIAGNOSIS — F341 Dysthymic disorder: Secondary | ICD-10-CM | POA: Diagnosis not present

## 2011-08-24 DIAGNOSIS — F411 Generalized anxiety disorder: Secondary | ICD-10-CM | POA: Insufficient documentation

## 2011-08-24 LAB — CBC
Platelets: 147 10*3/uL — ABNORMAL LOW (ref 150–400)
RBC: 3.82 MIL/uL — ABNORMAL LOW (ref 4.22–5.81)
RDW: 13.2 % (ref 11.5–15.5)
WBC: 8.9 10*3/uL (ref 4.0–10.5)

## 2011-08-24 LAB — ETHANOL: Alcohol, Ethyl (B): 212 mg/dL — ABNORMAL HIGH (ref 0–11)

## 2011-08-24 LAB — DIFFERENTIAL
Basophils Absolute: 0 10*3/uL (ref 0.0–0.1)
Lymphocytes Relative: 50 % — ABNORMAL HIGH (ref 12–46)
Lymphs Abs: 4.4 10*3/uL — ABNORMAL HIGH (ref 0.7–4.0)
Neutro Abs: 3.6 10*3/uL (ref 1.7–7.7)

## 2011-08-24 LAB — COMPREHENSIVE METABOLIC PANEL
ALT: 82 U/L — ABNORMAL HIGH (ref 0–53)
AST: 272 U/L — ABNORMAL HIGH (ref 0–37)
Alkaline Phosphatase: 127 U/L — ABNORMAL HIGH (ref 39–117)
CO2: 20 mEq/L (ref 19–32)
Chloride: 95 mEq/L — ABNORMAL LOW (ref 96–112)
GFR calc non Af Amer: 84 mL/min — ABNORMAL LOW (ref >90)
Glucose, Bld: 136 mg/dL — ABNORMAL HIGH (ref 70–99)
Potassium: 3.3 mEq/L — ABNORMAL LOW (ref 3.5–5.1)
Sodium: 131 mEq/L — ABNORMAL LOW (ref 135–145)
Total Bilirubin: 0.7 mg/dL (ref 0.3–1.2)

## 2011-08-24 LAB — RAPID URINE DRUG SCREEN, HOSP PERFORMED
Barbiturates: NOT DETECTED
Benzodiazepines: NOT DETECTED

## 2011-08-24 MED ORDER — OMEPRAZOLE MAGNESIUM 20 MG PO TBEC: 20.0000 mg | DELAYED_RELEASE_TABLET | Freq: Every day | ORAL | Status: DC

## 2011-08-24 MED ORDER — NAPHAZOLINE HCL 0.1 % OP SOLN
1.0000 [drp] | Freq: Every day | OPHTHALMIC | Status: DC
  Administered 2011-08-24: 1 [drp] via OPHTHALMIC
  Filled 2011-08-24: qty 15

## 2011-08-24 MED ORDER — CHLORDIAZEPOXIDE HCL 25 MG PO CAPS: 25.0000 mg | ORAL_CAPSULE | Freq: Every day | ORAL | Status: DC

## 2011-08-24 MED ORDER — LORAZEPAM 1 MG PO TABS: 1.0000 mg | ORAL_TABLET | Freq: Three times a day (TID) | ORAL | Status: DC | PRN

## 2011-08-24 MED ORDER — AMLODIPINE BESYLATE 5 MG PO TABS
5.0000 mg | ORAL_TABLET | Freq: Every day | ORAL | Status: DC
  Administered 2011-08-24: 5 mg via ORAL
  Filled 2011-08-24 (×4): qty 1

## 2011-08-24 MED ORDER — ADULT MULTIVITAMIN W/MINERALS CH
1.0000 | ORAL_TABLET | Freq: Every day | ORAL | Status: DC
  Administered 2011-08-24: 1 via ORAL
  Filled 2011-08-24: qty 1

## 2011-08-24 MED ORDER — CHLORDIAZEPOXIDE HCL 25 MG PO CAPS: 25.0000 mg | ORAL_CAPSULE | Freq: Three times a day (TID) | ORAL | Status: DC

## 2011-08-24 MED ORDER — ASPIRIN 81 MG PO TABS: 81.0000 mg | ORAL_TABLET | Freq: Every day | ORAL | Status: DC

## 2011-08-24 MED ORDER — ONDANSETRON HCL 4 MG PO TABS: 4.0000 mg | ORAL_TABLET | Freq: Three times a day (TID) | ORAL | Status: DC | PRN

## 2011-08-24 MED ORDER — ALUM & MAG HYDROXIDE-SIMETH 200-200-20 MG/5ML PO SUSP: 30.0000 mL | ORAL | Status: DC | PRN

## 2011-08-24 MED ORDER — CHLORDIAZEPOXIDE HCL 25 MG PO CAPS
50.0000 mg | ORAL_CAPSULE | Freq: Once | ORAL | Status: AC
  Administered 2011-08-24: 50 mg via ORAL
  Filled 2011-08-24: qty 2

## 2011-08-24 MED ORDER — VITAMIN B-1 100 MG PO TABS
100.0000 mg | ORAL_TABLET | Freq: Every day | ORAL | Status: DC
  Administered 2011-08-24: 100 mg via ORAL
  Filled 2011-08-24: qty 1

## 2011-08-24 MED ORDER — THIAMINE HCL 100 MG/ML IJ SOLN: 100.0000 mg | Freq: Once | INTRAMUSCULAR | Status: DC

## 2011-08-24 MED ORDER — CHLORDIAZEPOXIDE HCL 25 MG PO CAPS: 25.0000 mg | ORAL_CAPSULE | ORAL | Status: DC

## 2011-08-24 MED ORDER — METOPROLOL TARTRATE 25 MG PO TABS
12.5000 mg | ORAL_TABLET | Freq: Every day | ORAL | Status: DC
  Administered 2011-08-24: 12.5 mg via ORAL
  Filled 2011-08-24: qty 1

## 2011-08-24 MED ORDER — ONDANSETRON 4 MG PO TBDP
4.0000 mg | ORAL_TABLET | Freq: Four times a day (QID) | ORAL | Status: DC | PRN
  Administered 2011-08-24: 4 mg via ORAL
  Filled 2011-08-24: qty 1

## 2011-08-24 MED ORDER — CHLORDIAZEPOXIDE HCL 25 MG PO CAPS: 25.0000 mg | ORAL_CAPSULE | Freq: Four times a day (QID) | ORAL | Status: DC | PRN

## 2011-08-24 MED ORDER — LORAZEPAM 1 MG PO TABS: 0.0000 mg | ORAL_TABLET | Freq: Four times a day (QID) | ORAL | Status: DC

## 2011-08-24 MED ORDER — LOPERAMIDE HCL 2 MG PO CAPS: 2.0000 mg | ORAL_CAPSULE | ORAL | Status: DC | PRN

## 2011-08-24 MED ORDER — PANTOPRAZOLE SODIUM 40 MG PO TBEC
40.0000 mg | DELAYED_RELEASE_TABLET | Freq: Every day | ORAL | Status: DC
  Administered 2011-08-24: 40 mg via ORAL
  Filled 2011-08-24: qty 1

## 2011-08-24 MED ORDER — IBUPROFEN 600 MG PO TABS
600.0000 mg | ORAL_TABLET | Freq: Three times a day (TID) | ORAL | Status: DC | PRN
  Administered 2011-08-24: 600 mg via ORAL
  Filled 2011-08-24: qty 1

## 2011-08-24 MED ORDER — ASPIRIN 81 MG PO CHEW
81.0000 mg | CHEWABLE_TABLET | Freq: Every day | ORAL | Status: DC
  Administered 2011-08-24: 81 mg via ORAL
  Filled 2011-08-24: qty 1

## 2011-08-24 MED ORDER — LORAZEPAM 1 MG PO TABS: 0.0000 mg | ORAL_TABLET | Freq: Two times a day (BID) | ORAL | Status: DC

## 2011-08-24 MED ORDER — CHLORDIAZEPOXIDE HCL 25 MG PO CAPS
25.0000 mg | ORAL_CAPSULE | Freq: Four times a day (QID) | ORAL | Status: DC
  Administered 2011-08-24 (×2): 25 mg via ORAL
  Filled 2011-08-24 (×2): qty 1

## 2011-08-24 MED ORDER — HYDROXYZINE HCL 25 MG PO TABS
25.0000 mg | ORAL_TABLET | Freq: Four times a day (QID) | ORAL | Status: DC | PRN
  Administered 2011-08-24: 25 mg via ORAL
  Filled 2011-08-24: qty 1

## 2011-08-24 NOTE — ED Notes (Signed)
Wife complaining to patient he does not eat as he should, his hygiene is not good, drinks 4-8 beers per day, has been married 30 years but she no longer wants to spend time w/him, does not want to be on Ativan d/t how the Ativan makes him feel after he has taken it, can't control his legs and walk as he wants. States he needs help, does not want to go home until he is "fixed" and does not want to go home then if he has to be by himself

## 2011-08-24 NOTE — Progress Notes (Signed)
Pt confirms no pcp. CM reviewed written list of available pcps. Pt states Dr Dara Hoyer was previous pcp but he "retired" and he is willing to review information provided to find a new one. Reports being followed by [facility],The staff "I already been there"

## 2011-08-24 NOTE — ED Provider Notes (Signed)
History     CSN: [id number]  Arrival date & time 08/24/11  1240   First MD Initiated Contact with Patient 08/24/11 1413      Chief Complaint  Patient presents with  . V70.1    (Consider location/radiation/quality/duration/timing/severity/associated sxs/prior treatment) The history is provided by the patient.  Patient presents with anxiety, depression, and etoh intoxication. States that he suffers from severe anxiety and drinks to combat this. Admits drinking approximately a sixpack of beer daily. He states that his wife left him about 2 weeks ago "because I wouldn't shave every day." States "my wife told me he was worthless for me to come here and try to change myself." He has felt significantly more anxious due to this. He states he's not sleeping well and hasn't been eating. Denies suicidal or homicidal ideations. He denies nausea, vomiting, diarrhea, constipation, abdominal pain. He has not had any chest pain or shortness of breath. He denies any tobacco or illicit drug use.  A review of the previous chart indicates that the patient was seen for the same on February 7. He was here on a voluntary basis and talked with the ACT counselors, but did not wish to stay for placement for detox/anxiety.  Past Medical History  Diagnosis Date  . CAD (coronary artery disease)     Prior PCI to LAD in 1994  . Alcohol abuse, in remission   . Hyperlipidemia   . HTN (hypertension)   . Anxiety   . Normal nuclear stress test 2010    EF 77%  . Depression     Past Surgical History  Procedure Date  . Replacement total knee Feb 2010  . Cardiac catheterization 01/30/1993    EF 55-60%  . US echocardiography 09/04/2005    EF 55-60%  . Cardiovascular stress test 09/12/2010    EF 68%    No family history on file.  History  Substance Use Topics  . Smoking status: Former Smoker    Quit date: 02/28/1995  . Smokeless tobacco: Not on file  . Alcohol Use: Yes      Review of Systems  All other  systems reviewed and are negative.    Allergies  Barbiturates and Penicillins  Home Medications   Current Outpatient Rx  Name Route Sig Dispense Refill  . AMLODIPINE BESYLATE 5 MG PO TABS  TAKE ONE (1) TABLET(S) ONCE DAILY 90 tablet 3  . ASPIRIN 81 MG PO TABS Oral Take 81 mg by mouth daily.      Marland Kitchen METOPROLOL TARTRATE 25 MG PO TABS  TAKE 1/2 TABLET(S) TWICE DAILY BETA BLOCKER 90 tablet 3    Dr. Deborah Chalk has retired. Further refills will be b ...  . OMEPRAZOLE MAGNESIUM 20 MG PO TBEC Oral Take 20 mg by mouth as needed.      . TETRAHYDROZOLINE HCL 0.05 % OP SOLN Both Eyes Place 1 drop into both eyes daily.      BP 118/81  Pulse 108  Temp(Src) 98.5 F (36.9 C) (Oral)  Resp 20  Wt 187 lb (84.823 kg)  SpO2 98%  Physical Exam  Nursing note and vitals reviewed. Constitutional: He is oriented to person, place, and time. He appears well-developed and well-nourished. No distress.  HENT:  Head: Normocephalic and atraumatic.  Right Ear: External ear normal.  Left Ear: External ear normal.  Mouth/Throat: Oropharynx is clear and moist. No oropharyngeal exudate.  Eyes: Conjunctivae and EOM are normal. Pupils are equal, round, and reactive to light.  Neck: Normal range  of motion. Neck supple.  Cardiovascular: Normal rate, regular rhythm and normal heart sounds.  Exam reveals no gallop and no friction rub.   No murmur heard. Pulmonary/Chest: Effort normal and breath sounds normal. He has no wheezes. He exhibits no tenderness.  Abdominal: Soft. Bowel sounds are normal. There is no tenderness. There is no rebound and no guarding.  Musculoskeletal: Normal range of motion.  Lymphadenopathy:    He has no cervical adenopathy.  Neurological: He is alert and oriented to person, place, and time. No cranial nerve deficit.  Skin: Skin is warm and dry. No rash noted. He is not diaphoretic.  Psychiatric: His mood appears anxious. His affect is inappropriate. His speech is tangential. He is not  agitated, not aggressive, not withdrawn and not combative. Thought content is not paranoid and not delusional. He expresses no homicidal and no suicidal ideation.       Frequent tangential, inappropriate comments    ED Course  Procedures (including critical care time)  Labs Reviewed  ETHANOL - Abnormal; Notable for the following:    Alcohol, Ethyl (B) 212 (*)    All other components within normal limits  CBC - Abnormal; Notable for the following:    RBC 3.82 (*)    HCT 38.4 (*)    MCV 100.5 (*)    MCH 36.9 (*)    Platelets 147 (*)    All other components within normal limits  DIFFERENTIAL - Abnormal; Notable for the following:    Neutrophils Relative 40 (*)    Lymphocytes Relative 50 (*)    Lymphs Abs 4.4 (*)    All other components within normal limits  COMPREHENSIVE METABOLIC PANEL - Abnormal; Notable for the following:    Sodium 131 (*)    Potassium 3.3 (*)    Chloride 95 (*)    Glucose, Bld 136 (*)    Albumin 3.4 (*)    AST 272 (*)    ALT 82 (*)    Alkaline Phosphatase 127 (*)    GFR calc non Af Amer 84 (*)    All other components within normal limits  URINE RAPID DRUG SCREEN (HOSP PERFORMED)   No results found.   No diagnosis found.    MDM  Pt here on voluntary basis. His labs indicate alcoholic hepatitis secondary to alcohol abuse. His blood alcohol is 212. His labs were otherwise unremarkable. He is medically cleared to move to the psych ED. Holding orders placed. Discussed with the ACT counselors who will talk with him and make recommendations.  Grant Fontana, [place] 08/24/11 1530  1630 I was called by psych RN - pt apparently was c/o chest pain to the RNs. Pt states "this started since I got here" and "you are just going to let me die back here." States the pain is intermittent in nature and is sharp. Points to center of his chest when asked about location. Pain does not radiate and he denies dyspnea, diaphoresis. Has had slight nausea without emesis. On  exam: CTAB, RRR no m/r/g, nontender to palp, pt is not diaphoretic but appears very anxious. He expresses anxiety stating repeatedly as I examine him "Is everything sounding OK?" and "What did my labs look like?"  Pt seems very anxious. I have lower suspicion for this being cardiac in nature. ECG was performed which was essentially unchanged from previous except for tachycardia.   Date: 08/24/2011  Rate: 99  Rhythm: normal sinus rhythm  QRS Axis: normal  Intervals: PR prolonged  ST/T Wave  abnormalities: nonspecific ST changes  Conduction Disutrbances:first-degree A-V block   Narrative Interpretation:   Old EKG Reviewed: no significant changes from Feb 2010  Discussed with Dr. Effie Shy, oncoming EDP covering psych. He is aware and will follow.  Grant Fontana, [place] 08/24/11 [phone]

## 2011-08-24 NOTE — ED Notes (Signed)
Dr. Radford Pax informed pt needs screening, Security notified for wanding, awaiting Security & Dr. Radford Pax.

## 2011-08-24 NOTE — ED Notes (Signed)
Pt states "I'm depressed, my wife left me cause I wouldn't shave every day, it's hard, I've been drinking"; pt continues stating "just give me a minute, I'm cold, I think that policeman is going to arrest me cause may tag is expired, just give me a minute"; pt escorted to BR to change to paper scrubs.

## 2011-08-24 NOTE — ED Notes (Signed)
Pt came up to window stating that "he needed to see a dr and that he was falling apart and would die back there in his room". rn called physician assistant and she is coming to reassess pt. Pt reports he is "allergic to ativan". So pt is being put on a librium withdrawal protocol. PA in pt room at present.

## 2011-08-24 NOTE — ED Notes (Signed)
Pt reports he is having chest pain, the tech from [facility] is getting the portable EKG and will perform an EKG. Physician assistant reports that when she was assessing pt he was very anxious and making himself try to throw up.

## 2011-08-24 NOTE — BH Assessment (Signed)
Assessment Note   Dennis York is a 71 y.o. male who presents to [facility] ED voluntarily requesting detox. Patient reports he is drinking between 4-8 beers daily for the past "several years." He reports he begins drinking by 9:00 AM, to "help me be calm." Pt reports his wife him 10 days ago. He states since that time he has been drinking closer to 8 beers daily. He reports prior inpatient detox at [facility], but could not recall dates.   Pt denies and SI, HI, AHVH, and SA other than alcohol. Patient endorses depressive symptoms including irritability, anhedonia, and feeling despondent. Patient reports symptoms of anxiety, stating he has racing thoughts and racing heart. He states he has been prescribed Ativan but that he is allergic to it and can not take it. Patient expressed multiple times that he is concerned that there is something wrong with him medically and that he needs medical attention. Pt appears to be having somatic symptoms, stating he is having chest pains and difficulty walking. Patient is medically cleared.  Patient presented to [facility] ED earlier this month with similar complaints. Pt denied inpatient treatment and was given outpatient referrals. Patient reports he has not followed through with referrals. He reports he wants "to get well" and is willing to attempt to inpatient treatment at this time.   Axis I: Adjustment Disorder with Depressed Mood, Anxiety Disorder NOS, Mood Disorder NOS and Alcohol Dependence  Axis II: Deferred Axis III:  Past Medical History  Diagnosis Date  . CAD (coronary artery disease)     Prior PCI to LAD in 1994  . Alcohol abuse, in remission   . Hyperlipidemia   . HTN (hypertension)   . Anxiety   . Normal nuclear stress test 2010    EF 77%  . Depression    Axis IV: other psychosocial or environmental problems and problems with primary support group Axis V: 31-40 impairment in reality testing  Past Medical History:  Past Medical History  Diagnosis Date  . CAD  (coronary artery disease)     Prior PCI to LAD in 1994  . Alcohol abuse, in remission   . Hyperlipidemia   . HTN (hypertension)   . Anxiety   . Normal nuclear stress test 2010    EF 77%  . Depression     Past Surgical History  Procedure Date  . Replacement total knee Feb 2010  . Cardiac catheterization 01/30/1993    EF 55-60%  . US echocardiography 09/04/2005    EF 55-60%  . Cardiovascular stress test 09/12/2010    EF 68%    Family History: No family history on file.  Social History:  reports that he quit smoking about 16 years ago. He does not have any smokeless tobacco history on file. He reports that he drinks alcohol. He reports that he does not use illicit drugs.  Additional Social History:  Alcohol / Drug Use History of alcohol / drug use?: Yes Substance #1 Name of Substance 1: Alcohol 1 - Age of First Use: 21 1 - Amount (size/oz): between 4-8 beers 1 - Frequency: daily 1 - Duration: several years 1 - Last Use / Amount: 08/24/11, 4 beers Allergies:  Allergies  Allergen Reactions  . Barbiturates     REACTION: Reaction not known  . Penicillins     REACTION: Reaction not known    Home Medications:  Medications Prior to Admission  Medication Dose Route Frequency Provider Last Rate Last Dose  . alum & mag hydroxide-simeth (MAALOX/MYLANTA) 200-200-20  MG/5ML suspension 30 mL  30 mL Oral PRN Grant Fontana, PA      . amLODipine (NORVASC) tablet 5 mg  5 mg Oral Daily Grant Fontana, [place]   5 mg at 08/24/11 1907  . aspirin chewable tablet 81 mg  81 mg Oral Daily Grant Fontana, [place]   81 mg at 08/24/11 1657  . chlordiazePOXIDE (LIBRIUM) capsule 25 mg  25 mg Oral Q6H PRN Nelia Shi, MD      . chlordiazePOXIDE (LIBRIUM) capsule 25 mg  25 mg Oral QID Nelia Shi, MD   25 mg at 08/24/11 2134   Followed by  . chlordiazePOXIDE (LIBRIUM) capsule 25 mg  25 mg Oral TID Nelia Shi, MD       Followed by  . chlordiazePOXIDE (LIBRIUM) capsule 25 mg  25 mg Oral  BH-qamhs Nelia Shi, MD       Followed by  . chlordiazePOXIDE (LIBRIUM) capsule 25 mg  25 mg Oral Daily Nelia Shi, MD      . chlordiazePOXIDE (LIBRIUM) capsule 50 mg  50 mg Oral Once Nelia Shi, MD   50 mg at 08/24/11 1656  . hydrOXYzine (ATARAX/VISTARIL) tablet 25 mg  25 mg Oral Q6H PRN Nelia Shi, MD   25 mg at 08/24/11 2134  . ibuprofen (ADVIL,MOTRIN) tablet 600 mg  600 mg Oral Q8H PRN Grant Fontana, PA      . loperamide (IMODIUM) capsule 2-4 mg  2-4 mg Oral PRN Nelia Shi, MD      . LORazepam (ATIVAN) tablet 1 mg  1 mg Oral Q8H PRN Grant Fontana, PA      . metoprolol tartrate (LOPRESSOR) tablet 12.5 mg  12.5 mg Oral Daily Grant Fontana, [place]   12.5 mg at 08/24/11 1550  . mulitivitamin with minerals tablet 1 tablet  1 tablet Oral Daily Nelia Shi, MD   1 tablet at 08/24/11 1658  . naphazoline (NAPHCON) 0.1 % ophthalmic solution 1 drop  1 drop Both Eyes Daily Grant Fontana, [place]   1 drop at 08/24/11 1658  . ondansetron (ZOFRAN) tablet 4 mg  4 mg Oral Q8H PRN Grant Fontana, PA      . ondansetron (ZOFRAN-ODT) disintegrating tablet 4 mg  4 mg Oral Q6H PRN Nelia Shi, MD   4 mg at 08/24/11 2143  . pantoprazole (PROTONIX) EC tablet 40 mg  40 mg Oral Q1200 Grant Fontana, PA   40 mg at 08/24/11 1830  . thiamine (B-1) injection 100 mg  100 mg Intramuscular Once Nelia Shi, MD      . thiamine (VITAMIN B-1) tablet 100 mg  100 mg Oral Daily Nelia Shi, MD   100 mg at 08/24/11 1658  . DISCONTD: aspirin tablet 81 mg  81 mg Oral Daily Grant Fontana, [place]      . DISCONTD: LORazepam (ATIVAN) tablet 0-4 mg  0-4 mg Oral Q6H Grant Fontana, [place]      . DISCONTD: LORazepam (ATIVAN) tablet 0-4 mg  0-4 mg Oral Q12H Grant Fontana, [place]      . DISCONTD: omeprazole (PRILOSEC OTC) EC tablet 20 mg  20 mg Oral Daily Grant Fontana, [place]       Medications Prior to Admission  Medication Sig Dispense Refill  . amLODipine (NORVASC) 5 MG  tablet TAKE ONE (1) TABLET(S) ONCE DAILY  90 tablet  3  . aspirin 81 MG tablet Take 81 mg by mouth daily.        Marland Kitchen  metoprolol tartrate (LOPRESSOR) 25 MG tablet TAKE 1/2 TABLET(S) TWICE DAILY BETA BLOCKER  90 tablet  3  . omeprazole (PRILOSEC OTC) 20 MG tablet Take 20 mg by mouth as needed.        Marland Kitchen tetrahydrozoline 0.05 % ophthalmic solution Place 1 drop into both eyes daily.        OB/GYN Status:  No LMP for male patient.  General Assessment Data Location of Assessment: [facility] ED ACT Assessment: Yes Living Arrangements: Alone Can pt return to current living arrangement?: Yes Admission Status: Voluntary Is patient capable of signing voluntary admission?: Yes Transfer from: Acute Hospital Referral Source: Self/Family/Friend  Education Status Is patient currently in school?: No  Risk to self Suicidal Ideation: No Suicidal Intent: No Is patient at risk for suicide?: Yes Suicidal Plan?: No Access to Means: No What has been your use of drugs/alcohol within the last 12 months?: alcohol Previous Attempts/Gestures: No How many times?: 0  Other Self Harm Risks: none Triggers for Past Attempts: None known Intentional Self Injurious Behavior: None Family Suicide History: No Recent stressful life event(s): Divorce Persecutory voices/beliefs?: No Depression: Yes Depression Symptoms: Despondent;Loss of interest in usual pleasures;Feeling worthless/self pity Substance abuse history and/or treatment for substance abuse?: Yes Suicide prevention information given to non-admitted patients: Not applicable  Risk to Others Homicidal Ideation: No Thoughts of Harm to Others: No Current Homicidal Intent: No Current Homicidal Plan: No Access to Homicidal Means: No Identified Victim: none History of harm to others?: No Assessment of Violence: None Noted Violent Behavior Description: none Does patient have access to weapons?: No Criminal Charges Pending?: No Does patient have a court date:  No  Psychosis Hallucinations: None noted Delusions: None noted  Mental Status Report Appear/Hygiene: Disheveled Eye Contact: Fair Motor Activity: Unremarkable Speech: Logical/coherent Level of Consciousness: Alert Mood: Depressed;Anxious Affect: Depressed;Anxious Anxiety Level: Severe Thought Processes: Coherent;Relevant Judgement: Impaired Orientation: Person;Place;Time;Situation Obsessive Compulsive Thoughts/Behaviors: None  Cognitive Functioning Concentration: Normal Memory: Recent Intact;Remote Intact IQ: Average Insight: Poor Impulse Control: Fair Appetite: Fair Weight Loss: 0  Weight Gain: 0  Sleep: Decreased Vegetative Symptoms: None  Prior Inpatient Therapy Prior Inpatient Therapy: Yes Prior Therapy Facilty/Provider(s): [facility] Reason for Treatment: detox  Prior Outpatient Therapy Prior Outpatient Therapy: No  ADL Screening (condition at time of admission) Patient's cognitive ability adequate to safely complete daily activities?: Yes Patient able to express need for assistance with ADLs?: Yes Independently performs ADLs?: Yes       Abuse/Neglect Assessment (Assessment to be complete while patient is alone) Physical Abuse: Denies Verbal Abuse: Denies Sexual Abuse: Denies Exploitation of patient/patient's resources: Denies Self-Neglect: Denies Values / Beliefs Cultural Requests During Hospitalization: None Spiritual Requests During Hospitalization: None     Nutrition Screen Diet: Regular Unintentional weight loss greater than 10lbs within the last month: No Dysphagia: No Home Tube Feeding or Total Parenteral Nutrition (TPN): No Patient appears severely malnourished: No Pregnant or Lactating: No  Additional Information 1:1 In Past 12 Months?: No CIRT Risk: No Elopement Risk: No Does patient have medical clearance?: Yes     Disposition:  Disposition Disposition of Patient: Inpatient treatment program Type of inpatient treatment program:  Adult Patient has been referred to [facility] for inpatient detox.  On Site Evaluation by:   Reviewed with Physician:     Marjean Donna 08/24/2011 9:45 PM

## 2011-08-24 NOTE — ED Notes (Signed)
Pt wanded by [employer]. Pt has 2 belongings bag.

## 2011-08-25 ENCOUNTER — Inpatient Hospital Stay (HOSPITAL_COMMUNITY)
Admission: AD | Admit: 2011-08-25 | Discharge: 2011-08-30 | DRG: 897 | Disposition: A | Discharge status: Home / Self Care | Payer: Medicare Other | Source: Ambulatory Visit | Attending: Psychiatry | Admitting: Psychiatry

## 2011-08-25 ENCOUNTER — Encounter (HOSPITAL_COMMUNITY): Payer: Self-pay

## 2011-08-25 DIAGNOSIS — Z79899 Other long term (current) drug therapy: Secondary | ICD-10-CM

## 2011-08-25 DIAGNOSIS — F419 Anxiety disorder, unspecified: Secondary | ICD-10-CM | POA: Diagnosis present

## 2011-08-25 DIAGNOSIS — I2510 Atherosclerotic heart disease of native coronary artery without angina pectoris: Secondary | ICD-10-CM

## 2011-08-25 DIAGNOSIS — T424X5A Adverse effect of benzodiazepines, initial encounter: Secondary | ICD-10-CM

## 2011-08-25 DIAGNOSIS — Z880 Allergy status to penicillin: Secondary | ICD-10-CM

## 2011-08-25 DIAGNOSIS — F1010 Alcohol abuse, uncomplicated: Principal | ICD-10-CM

## 2011-08-25 DIAGNOSIS — F1020 Alcohol dependence, uncomplicated: Secondary | ICD-10-CM | POA: Diagnosis present

## 2011-08-25 DIAGNOSIS — R279 Unspecified lack of coordination: Secondary | ICD-10-CM

## 2011-08-25 DIAGNOSIS — E785 Hyperlipidemia, unspecified: Secondary | ICD-10-CM

## 2011-08-25 DIAGNOSIS — Z951 Presence of aortocoronary bypass graft: Secondary | ICD-10-CM

## 2011-08-25 DIAGNOSIS — Z87891 Personal history of nicotine dependence: Secondary | ICD-10-CM

## 2011-08-25 DIAGNOSIS — F3289 Other specified depressive episodes: Secondary | ICD-10-CM

## 2011-08-25 DIAGNOSIS — Z96659 Presence of unspecified artificial knee joint: Secondary | ICD-10-CM

## 2011-08-25 DIAGNOSIS — Z885 Allergy status to narcotic agent status: Secondary | ICD-10-CM

## 2011-08-25 DIAGNOSIS — I10 Essential (primary) hypertension: Secondary | ICD-10-CM

## 2011-08-25 DIAGNOSIS — F411 Generalized anxiety disorder: Secondary | ICD-10-CM

## 2011-08-25 DIAGNOSIS — F329 Major depressive disorder, single episode, unspecified: Secondary | ICD-10-CM | POA: Diagnosis present

## 2011-08-25 MED ORDER — ASPIRIN 81 MG PO CHEW
81.0000 mg | CHEWABLE_TABLET | Freq: Every day | ORAL | Status: DC
  Administered 2011-08-25 – 2011-08-30 (×6): 81 mg via ORAL
  Filled 2011-08-25 (×7): qty 1

## 2011-08-25 MED ORDER — CHLORDIAZEPOXIDE HCL 25 MG PO CAPS
25.0000 mg | ORAL_CAPSULE | Freq: Four times a day (QID) | ORAL | Status: AC
  Administered 2011-08-25 (×4): 25 mg via ORAL
  Filled 2011-08-25 (×4): qty 1

## 2011-08-25 MED ORDER — CHLORDIAZEPOXIDE HCL 25 MG PO CAPS
25.0000 mg | ORAL_CAPSULE | Freq: Once | ORAL | Status: AC
  Administered 2011-08-25: 25 mg via ORAL
  Filled 2011-08-25: qty 1

## 2011-08-25 MED ORDER — PANTOPRAZOLE SODIUM 40 MG PO TBEC
40.0000 mg | DELAYED_RELEASE_TABLET | Freq: Two times a day (BID) | ORAL | Status: DC
  Administered 2011-08-25 – 2011-08-30 (×11): 40 mg via ORAL
  Filled 2011-08-25 (×14): qty 1

## 2011-08-25 MED ORDER — CHLORDIAZEPOXIDE HCL 25 MG PO CAPS
25.0000 mg | ORAL_CAPSULE | Freq: Every day | ORAL | Status: AC
  Administered 2011-08-28: 25 mg via ORAL
  Filled 2011-08-25: qty 1

## 2011-08-25 MED ORDER — TETRAHYDROZOLINE HCL 0.05 % OP SOLN
1.0000 [drp] | Freq: Every day | OPHTHALMIC | Status: DC
  Administered 2011-08-25 – 2011-08-30 (×6): 1 [drp] via OPHTHALMIC
  Filled 2011-08-25: qty 15

## 2011-08-25 MED ORDER — ACETAMINOPHEN 325 MG PO TABS
650.0000 mg | ORAL_TABLET | Freq: Four times a day (QID) | ORAL | Status: DC | PRN
  Administered 2011-08-28 – 2011-08-29 (×3): 650 mg via ORAL

## 2011-08-25 MED ORDER — METOPROLOL TARTRATE 12.5 MG HALF TABLET: 12.5000 mg | ORAL_TABLET | Freq: Two times a day (BID) | ORAL | Status: DC

## 2011-08-25 MED ORDER — AMLODIPINE BESYLATE 5 MG PO TABS
5.0000 mg | ORAL_TABLET | Freq: Every day | ORAL | Status: DC
  Administered 2011-08-25: 5 mg via ORAL

## 2011-08-25 MED ORDER — CHLORDIAZEPOXIDE HCL 25 MG PO CAPS
25.0000 mg | ORAL_CAPSULE | ORAL | Status: AC
  Administered 2011-08-27 (×2): 25 mg via ORAL
  Filled 2011-08-25 (×2): qty 1

## 2011-08-25 MED ORDER — HYDROXYZINE HCL 25 MG PO TABS
25.0000 mg | ORAL_TABLET | Freq: Four times a day (QID) | ORAL | Status: AC | PRN
  Administered 2011-08-26: 25 mg via ORAL

## 2011-08-25 MED ORDER — AMLODIPINE BESYLATE 5 MG PO TABS
5.0000 mg | ORAL_TABLET | Freq: Every day | ORAL | Status: DC
  Administered 2011-08-25 – 2011-08-30 (×6): 5 mg via ORAL
  Filled 2011-08-25 (×8): qty 1

## 2011-08-25 MED ORDER — VITAMIN B-1 100 MG PO TABS
100.0000 mg | ORAL_TABLET | Freq: Every day | ORAL | Status: DC
  Administered 2011-08-26 – 2011-08-30 (×5): 100 mg via ORAL
  Filled 2011-08-25 (×7): qty 1

## 2011-08-25 MED ORDER — ALUM & MAG HYDROXIDE-SIMETH 200-200-20 MG/5ML PO SUSP: 30.0000 mL | ORAL | Status: DC | PRN

## 2011-08-25 MED ORDER — METOPROLOL TARTRATE 12.5 MG HALF TABLET
12.5000 mg | ORAL_TABLET | Freq: Two times a day (BID) | ORAL | Status: DC
  Administered 2011-08-25 – 2011-08-27 (×6): via ORAL
  Administered 2011-08-28 (×2): 12.5 mg via ORAL
  Administered 2011-08-29 (×2): via ORAL
  Administered 2011-08-30 (×2): 12.5 mg via ORAL
  Filled 2011-08-25 (×15): qty 1

## 2011-08-25 MED ORDER — THIAMINE HCL 100 MG/ML IJ SOLN
100.0000 mg | Freq: Once | INTRAMUSCULAR | Status: AC
  Administered 2011-08-25: 02:00:00 via INTRAMUSCULAR

## 2011-08-25 MED ORDER — OMEPRAZOLE MAGNESIUM 20 MG PO TBEC: 20.0000 mg | DELAYED_RELEASE_TABLET | Freq: Two times a day (BID) | ORAL | Status: DC

## 2011-08-25 MED ORDER — CHLORDIAZEPOXIDE HCL 25 MG PO CAPS: 25.0000 mg | ORAL_CAPSULE | Freq: Four times a day (QID) | ORAL | Status: AC | PRN

## 2011-08-25 MED ORDER — ONDANSETRON 4 MG PO TBDP: 4.0000 mg | ORAL_TABLET | Freq: Four times a day (QID) | ORAL | Status: AC | PRN

## 2011-08-25 MED ORDER — PANTOPRAZOLE SODIUM 40 MG PO TBEC: 40.0000 mg | DELAYED_RELEASE_TABLET | Freq: Every day | ORAL | Status: DC | PRN

## 2011-08-25 MED ORDER — CHLORDIAZEPOXIDE HCL 25 MG PO CAPS
25.0000 mg | ORAL_CAPSULE | Freq: Three times a day (TID) | ORAL | Status: AC
  Administered 2011-08-26 (×3): 25 mg via ORAL
  Filled 2011-08-25 (×3): qty 1

## 2011-08-25 MED ORDER — MAGNESIUM HYDROXIDE 400 MG/5ML PO SUSP: 30.0000 mL | Freq: Every day | ORAL | Status: DC | PRN

## 2011-08-25 MED ORDER — LOPERAMIDE HCL 2 MG PO CAPS: 2.0000 mg | ORAL_CAPSULE | ORAL | Status: AC | PRN

## 2011-08-25 MED ORDER — ACAMPROSATE CALCIUM 333 MG PO TBEC
666.0000 mg | DELAYED_RELEASE_TABLET | Freq: Three times a day (TID) | ORAL | Status: DC
  Administered 2011-08-25 – 2011-08-30 (×17): 666 mg via ORAL
  Filled 2011-08-25 (×21): qty 2

## 2011-08-25 MED ORDER — DISULFIRAM 250 MG PO TABS
500.0000 mg | ORAL_TABLET | Freq: Every day | ORAL | Status: DC
  Administered 2011-08-25 – 2011-08-30 (×4): 500 mg via ORAL
  Filled 2011-08-25 (×6): qty 2

## 2011-08-25 MED ORDER — NAPHAZOLINE HCL 0.1 % OP SOLN
1.0000 [drp] | Freq: Every day | OPHTHALMIC | Status: DC
  Filled 2011-08-25: qty 15

## 2011-08-25 MED ORDER — ADULT MULTIVITAMIN W/MINERALS CH
1.0000 | ORAL_TABLET | Freq: Every day | ORAL | Status: DC
  Administered 2011-08-25 – 2011-08-30 (×6): 1 via ORAL
  Filled 2011-08-25 (×7): qty 1

## 2011-08-25 NOTE — Tx Team (Signed)
Initial Interdisciplinary Treatment Plan  PATIENT STRENGTHS: (choose at least two) Average or above average intelligence Communication skills Financial means General fund of knowledge Special hobby/interest  PATIENT STRESSORS: Health problems Marital or family conflict Substance abuse    PROBLEM LIST: Problem List/Patient Goals Date to be addressed Date deferred Reason deferred Estimated date of resolution  Abusive use of alcohol 08/24/11     Depressive anxiety 08/24/11     Marital problems 08/25/11     Multiple physical conditions 08/25/11     Decreased physical stamina 08/25/11     Issues related to aging 08/25/11                        DISCHARGE CRITERIA:  Ability to meet basic life and health needs Improved stabilization in mood, thinking, and/or behavior Medical problems require only outpatient monitoring Motivation to continue treatment in a less acute level of care Need for constant or close observation no longer present Safe-care adequate arrangements made Verbal commitment to aftercare and medication compliance Withdrawal symptoms are absent or subacute and managed without 24-hour nursing intervention  PRELIMINARY DISCHARGE PLAN: Attend 12-step recovery group Participate in family therapy Return to previous living arrangement  PATIENT/FAMIILY INVOLVEMENT: This treatment plan has been presented to and reviewed with the patient, Dennis York, and/or family member,  The patient and family have been given the opportunity to ask questions and make suggestions.  Dennis York 08/25/2011, 5:55 AM

## 2011-08-25 NOTE — H&P (Signed)
Psychiatric Admission Assessment Adult  Patient Identification:  Dennis York Date of Evaluation:  08/25/2011 71 yo MWM  History of Present Illness: Says the he drinks to combat his fears. Wife of 30 years  left him a week ago and said he had to get help and be sober before she would reconsider coming home. His last attempt at sobriety was last March was here.Then wentt o [facility] a 28 day detox but stayed sober less than a month. Today he is convinced that he self medicates with alcohol.Has been drinking 4-8 12 oz beers a day. Denies withdrawal seizures DT's or blackouts. Does have mild hand tremors.     Past Psychiatric History: Detoxes in past 40 years. Also reports having had abad reaction to Wellbutrina few years ago-it made him nervous.   Substance Abuse History:  Social History:    reports that he quit smoking about 16 years ago. He does not have any smokeless tobacco history on file. He reports that he drinks alcohol. He reports that he does not use illicit drugs. Currently drinking 4-8 12oz beers a day. [employer] grad class of [employer] Married X 2 this time 30 years 2 sons in their 70's. Self employed irrigation and real estate.   Family Psych History: Denies   Past Medical History:     Past Medical History  Diagnosis Date  . CAD (coronary artery disease)     Prior PCI to LAD in 1994  . Alcohol abuse, in remission   . Hyperlipidemia   . HTN (hypertension)   . Anxiety   . Normal nuclear stress test 2010    EF 77%  . Depression        Past Surgical History  Procedure Date  . Replacement total knee Feb 2010  . Cardiac catheterization 01/30/1993    EF 55-60%  . US echocardiography 09/04/2005    EF 55-60%  . Cardiovascular stress test 09/12/2010    EF 68%  . Joint replacement     Allergies:  Allergies  Allergen Reactions  . Barbiturates     REACTION: Reaction not known  . Penicillins     REACTION: Reaction not known    Current Medications:  Prior to Admission  medications   Medication Sig Start Date End Date Taking? Authorizing Provider  amLODipine (NORVASC) 5 MG tablet TAKE ONE (1) TABLET(S) ONCE DAILY 11/07/10   Roger Shelter, MD  aspirin 81 MG tablet Take 81 mg by mouth daily.      Historical Provider, MD  metoprolol tartrate (LOPRESSOR) 25 MG tablet TAKE 1/2 TABLET(S) TWICE DAILY BETA BLOCKER 01/22/11   Rosalio Macadamia, NP  omeprazole (PRILOSEC OTC) 20 MG tablet Take 20 mg by mouth as needed.      Historical Provider, MD  tetrahydrozoline 0.05 % ophthalmic solution Place 1 drop into both eyes daily.    Historical Provider, MD    Mental Status Examination/Evaluation: Objective:  Appearance: Disheveled  Psychomotor Activity:  Decreased  Eye Contact::  Good  Speech:  Clear and Coherent  Volume:  Normal  Mood:irritable anxious     Affect:  Congruent  Thought Process:clear rational goal oriented- have wife come back    Orientation:  Full  Thought Content:  No AVH or psychosis   Suicidal Thoughts:  No  Homicidal Thoughts:  No  Judgement:  Impaired  Insight:  Shallow    DIAGNOSIS:    AXIS I Alcohol Abuse Anxiety   AXIS II Deferred  AXIS III See medical history.  AXIS  IV Chronic alcoholism   AXIS V 41-50 serious symptoms     Treatment Plan Summary: Admit for safety & stabilization. Start Antabuse and Camprall to help with alcohol cravings. Think about our CD-IOP at discharge.

## 2011-08-25 NOTE — [facility] Suicide Risk Assessment (Signed)
Suicide Risk Assessment  Admission Assessment     Demographic factors:    Current Mental Status:    Loss Factors:    Historical Factors:    Risk Reduction Factors:     CLINICAL FACTORS:   Severe Anxiety and/or Agitation Depression:   Anhedonia Comorbid alcohol abuse/dependence Hopelessness Insomnia Recent sense of peace/wellbeing Alcohol/Substance Abuse/Dependencies Medical Diagnoses and Treatments/Surgeries  COGNITIVE FEATURES THAT CONTRIBUTE TO RISK:  Closed-mindedness Polarized thinking    SUICIDE RISK:   Minimal: No identifiable suicidal ideation.  Patients presenting with no risk factors but with morbid ruminations; may be classified as minimal risk based on the severity of the depressive symptoms  PLAN OF CARE: Admitted for alcohol detoxication and may needs rehab services after discharge.     Dennis York,Dennis R. 08/25/2011, 11:13 AM

## 2011-08-25 NOTE — ED Provider Notes (Signed)
Medical screening examination/treatment/procedure(s) were performed by non-physician practitioner and as supervising physician I was immediately available for consultation/collaboration.    Nelia Shi, MD 08/25/11 [phone]

## 2011-08-25 NOTE — Progress Notes (Signed)
Pt states that he is very weak due to medication that was given to him in the ED.  When giving Pt education about Anabuse Pt questioned "so I cannot have a beer if I go fishing with my son?" Pt given information on the drug and what will happen to him. Also pt handed this writer his lab values and asked to have an explaination. Pt is aware that that his liver is compromised. Will continue to education Pt more tomorrow. Is unable to follow all information today. Denies SI and HI.

## 2011-08-25 NOTE — Progress Notes (Signed)
[facility] Adult Inpatient Family/Significant Other Suicide Prevention Education  Suicide Prevention Education:  Education Completed; Art (brother) [phone],  (name of family member/significant other) has been identified by the patient as the family member/significant other with whom the patient will be residing, and identified as the person(s) who will aid the patient in the event of a mental health crisis (suicidal ideations/suicide attempt).  With written consent from the patient, the family member/significant other has been provided the following suicide prevention education, prior to the and/or following the discharge of the patient.  The suicide prevention education provided includes the following:  Suicide risk factors  Suicide prevention and interventions  National Suicide Hotline telephone number  [facility] assessment telephone number  [facility] Emergency Assistance 911  [facility] and/or Residential Mobile Crisis Unit telephone number  Request made of family/significant other to:  Remove weapons (e.g., guns, rifles, knives), all items previously/currently identified as safety concern.    Remove drugs/medications (over-the-counter, prescriptions, illicit drugs), all items previously/currently identified as a safety concern.  The family member/significant other verbalizes understanding of the suicide prevention education information provided.  The family member/significant other agrees to remove the items of safety concern listed above.  Pt. accepted information on suicide prevention, warning signs to look for with suicide and crisis line numbers to use. The pt. agreed to call crisis line numbers if having warning signs or having thoughts of suicide.      [facility] 08/25/2011, 10:23 AM

## 2011-08-25 NOTE — Progress Notes (Signed)
71 yr old, twice married white male admitted on voluntary basis for alcohol detox.  Began drinking alcohol at age 57 yrs. Over the years, has had periods of sobriety lasting from 60 days to 6 months.  Consumes 4 to 6, 12 oz cans  Beer  daily,  usually  Beginning before noon.  Medically there are multiple physical conditions (see history).  Currently he is uable to stand and needs a wheelchair to get about  Cites marital and physical problems as reason for wanting to get sober.

## 2011-08-25 NOTE — Progress Notes (Signed)
[facility] Group Notes:  (Counselor/Nursing/MHT/Case Management/Adjunct)  08/25/2011 1315  Type of Therapy:  Group Therapy  Participation Level:  Active  Participation Quality:  Appropriate, Attentive and Sharing  Affect:  Appropriate  Cognitive:  Appropriate  Insight:  Good  Engagement in Group:  Good  Engagement in Therapy:  Good  Modes of Intervention:  Activity, Clarification, Problem-solving and Socialization  Summary of Progress/Problems: Pt attended and participated in group session on Self-sabotage and enabling. Pt was asked how they self-sabotage. Pt stated that he feels as though his wife is an Scientist, forensic and that she enables his bad habits. Pt shared that after his wife attended Al-Anon groups, she discovered that she had been enabling his habits and refused to continue enabling him. Pt states that he is ready to move forward and understands her decision to no longer enable him.   Jaylie Neaves 08/25/2011, 2:27 PM

## 2011-08-25 NOTE — Progress Notes (Signed)
Adult Psychosocial Assessment Update Interdisciplinary Team  Previous [facility] admissions/discharges:  Admissions Discharges  Date:  10/31/10 Date:11/04/10  Date: Date:  Date: Date:  Date: Date:  Date: Date:   Changes since the last Psychosocial Assessment (including adherence to outpatient mental health and/or substance abuse treatment, situational issues contributing to decompensation and/or relapse). Pt reports to drinking one third (4-8 beer, 12 oz., over 18 hour period) of what he was         prior to last admission. Pt says that his job is "fine". Two days ago, pt went to ED and  had a bad reaction to Ativan given to him in ED; woke up next day and could not walk.  This same occurrence took place two years ago. Pt's wife left him a week ago and told  him that she was not coming back. Pt stated that he is struggling with anxiety and  depression. During update assessment, pt shared that he was nauseous and that his  appetite has been poor.   Discharge Plan 1. Will you be returning to the same living situation after discharge?   Yes: No:      If no, what is your plan?    Pt is unclear; he is basing this decision on what happens with his wife. If she does not   come back home, he does not what to go back home, but if she does, then he plans to   go home.   2. Would you like a referral for services when you are discharged? Yes:     If yes, for what services?  No:       Yes, he is interested. He did not have a great experience [facility]; he would like to   go to [facility] but he cannot afford it because they do not take insurance.     Summary and Recommendations (to be completed by the evaluator) PT is a 71 year old male seeking treatment for anxiety, depression, and alcohol abuse.  Recommendations for treatment include crisis stabilization, case management,  medicine management, psychoeducation groups to teach coping skills and group  Therapy.                 Signature:  Kym Groom, 08/25/2011 8:37 AM

## 2011-08-26 MED ORDER — MIRTAZAPINE 15 MG PO TBDP
15.0000 mg | ORAL_TABLET | Freq: Every evening | ORAL | Status: DC | PRN
  Administered 2011-08-26: 15 mg via ORAL
  Filled 2011-08-26: qty 1

## 2011-08-26 NOTE — Progress Notes (Signed)
Pt refused his antabuse today stating that he wants to talk with his primary doctor when he gets out to see what he wants to do about this medication. Pt states that we do not understand him "I cannot think about not being able to drink, it is too scary to me". Pt continues to states that he is having trouble ambulating and remains in the wheelchair. Alertness is much better today and Pt has been up attending the groups and interacting with his peers. Denies SI and HI. Did state that when he left here he was going to get on a plane and fly where ever he wanted to because he is "no longer married.myalgias wife left me". Affect is sad and mood depressed. Fearful about changing his lifestyle. Pt was given gentle honesty about his drinking and his lab values. One to one time given.

## 2011-08-26 NOTE — Progress Notes (Signed)
Pt. attended and participated in aftercare planning group. [facility] support group information was given as well as information on suicide prevention information, warning signs to look for with suicide and crisis line numbers to use. Pt states that he is sleepy this morning and denies SI and HI. Pt stated that he is interested in [facility] in [place], [place]. Pt stated that he would like his friend to go to [facility] with him. Pt stated that he is trying to prepare himself for knee replacement surgery in 2 weeks.

## 2011-08-26 NOTE — Progress Notes (Signed)
[facility] Group Notes:  (Counselor/Nursing/MHT/Case Management/Adjunct)  08/26/2011 5:52 PM  Type of Therapy:  Group Therapy  Participation Level:  Active  Participation Quality:  Appropriate  Affect:  Appropriate  Cognitive:  Alert and Appropriate  Insight:  Good  Engagement in Group:  Good  Engagement in Therapy:  Good  Modes of Intervention:  Clarification and Support  Summary of Progress/Problems: Pt. Participated in group on supports and when they are healthy and unhealthy. Each pt. identified who was a support for them and what to do when their supports are not there. Each pt was encouraged to seek support through supports groups and thorough the person that they identified. Pt. stated that his brother and sister is a support and became emotional and cried when talking about his wife.   Dennis York [place] 08/26/2011, 5:52 PM

## 2011-08-26 NOTE — Progress Notes (Signed)
Patient ID: Rashied Corallo, male   DOB: 1940-09-08, 71 y.o.   MRN: [id number] Pt.denies lethality and A/V/H's.  Questions had to be repeated often, thought Pt. repeatedly denies auditory impairment.  HS meds were given at the bedside.   23:30--Pt. found to be lying in a bed with smeared stool and initially refused to take a shower.  Assisted Pt. to stand and ambulate a few feet to the bathroom, where he sat on the toilet to remove his clothing, before using the railings to assist to stand and step to the shower chair placed for his use. Compete linen change was done while Pt. was in the bathroom. 00:00--Pt. was given hydroxyzine to help him fall to sleep. 01:30--Pt. Seen standing outside of his bedroom  and asked MHT where his bathroom was located: redirected him to his bed after Pt. stated he didn't ned to use it, he just didn't know where it was; on return to his room for recheck, found sitting on the side of the bed putting on multiple layers of clothing.  Pt. was assisted to get back into his bed and is now resting quietly.

## 2011-08-26 NOTE — H&P (Signed)
Patient was seen, case reviewed and agree with treatment plan.

## 2011-08-26 NOTE — Progress Notes (Signed)
D:  Pt is disoriented, not alert, and confused about surroundings.  Pt was incontinent of bowel in bed tonight.  He didn't attend group this evening.  He is difficult to redirect at times.  A:  Reported pt interactions to RN.  R:  Pt is safe.Aundria Rud, Aeson Sawyers L, MHT/NS
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
## 2011-08-27 DIAGNOSIS — F1020 Alcohol dependence, uncomplicated: Secondary | ICD-10-CM

## 2011-08-27 NOTE — Progress Notes (Signed)
Patient ID: Dennis York, male   DOB: 11-05-1940, 71 y.o.   MRN: [id number] Explained to pt in no uncertain terms that he will die if he decided to continue to drink.  Explained that his liver is showing signs of damage. He is in a wheelchair and non ambulatory.  He reports that it occurred after his only dose of Ativan.  On Internet search, there is 100% of folks who had had that response to Ativan occurred in the first month of use.  Will make referral to neurology on why he can't walk after his only dose of Ativan. Challenged pt to consider seeing his grandchildren graduate from high school or dying with alcohol related body failure.  Pt stated that he hated being here in the hospital and hated being in the rehab center where he was before. He was sober less than a month after leaving that center early.  He only wants to go home.  If seems that he still believes that he can drink just not as much.  His wife has left him because of his drinking. He was informed that his liver enzymes are 5 times what they are supposed to be. He had been started on Campral and Antabuse, but wants to stop them.

## 2011-08-27 NOTE — Progress Notes (Signed)
Patient seen during d/c planning group.  He advised of admitting to hospital to detox from ETOH after wife left him two weeks ago.  Patient stated that he is unable to walk and no one is listening to him.  He endorses depression at eight and anxiety at six.  He denies SI/HI.  He reports having a home and transportation.  He will need referral for medication management.

## 2011-08-27 NOTE — Progress Notes (Signed)
Pt denies SI/HI/AVH. Pt rates his depression an 8, anxiety a 6, and helplessness as a 10. Pt rates helplessness as 10 because he needs help with mobility. Pt unable to transfer from bed to wheelchair and vice versa safely, without assistance. Pt advised to call for assistance. Call bell within reach.

## 2011-08-27 NOTE — Progress Notes (Signed)
[facility] MD Progress Note  08/27/2011 2:15 PM :  Diagnosis:  Axis I: Alcohol Abuse  ADL's:  Impaired  Sleep: Poor  Appetite:  Fair  Suicidal Ideation:  Denies adamantly any suicidal thoughts. Homicidal Ideation:  Denies adamantly any homicidal thoughts.  Mental Status Examination/Evaluation: Objective:  Appearance: Disheveled  Eye Contact::  Fair  Speech:  Clear and Coherent  Volume:  Normal  Mood:  5 /10 on a scale of 1 is the best and 10 is the worst  Anxiety: a little high, but not like it was.  Affect:  Congruent  Thought Process:  solely focused on whether he is going to be able to walk again.  Orientation:  Disoriented claiming that he has been in this hospital for 7 days  Thought Content:  WDL  Suicidal Thoughts:  No  Homicidal Thoughts:  No  Memory:  Immediate;   Poor  Judgement:  Impaired  Insight:  Lacking  Psychomotor Activity:  Normal  Concentration:  Fair  Recall:  Fair  Akathisia:  No  AIMS (if indicated):     Assets:  Communication Skills Desire for Improvement  Sleep:  Number of Hours: 2.75    Vital Signs:Blood pressure 125/77, pulse 92, temperature 97.1 F (36.2 C), temperature source Oral, resp. rate 20, height 5' 8.5" (1.74 m), weight 85.276 kg (188 lb). Current Medications: Current Facility-Administered Medications  Medication Dose Route Frequency Provider Last Rate Last Dose  . acamprosate (CAMPRAL) tablet 666 mg  666 mg Oral TID WC Mickie D. Adams, PA   666 mg at 08/27/11 1303  . acetaminophen (TYLENOL) tablet 650 mg  650 mg Oral Q6H PRN Nehemiah Settle, MD      . alum & mag hydroxide-simeth (MAALOX/MYLANTA) 200-200-20 MG/5ML suspension 30 mL  30 mL Oral Q4H PRN Nehemiah Settle, MD      . amLODipine (NORVASC) tablet 5 mg  5 mg Oral Daily Nehemiah Settle, MD   5 mg at 08/27/11 1005  . aspirin chewable tablet 81 mg  81 mg Oral Daily Nehemiah Settle, MD   81 mg at 08/27/11 1005  . chlordiazePOXIDE (LIBRIUM)  capsule 25 mg  25 mg Oral TID Nehemiah Settle, MD   25 mg at 08/26/11 1715   Followed by  . chlordiazePOXIDE (LIBRIUM) capsule 25 mg  25 mg Oral BH-qamhs Nehemiah Settle, MD   25 mg at 08/27/11 1004   Followed by  . chlordiazePOXIDE (LIBRIUM) capsule 25 mg  25 mg Oral Daily Nehemiah Settle, MD      . chlordiazePOXIDE (LIBRIUM) capsule 25 mg  25 mg Oral Q6H PRN Nehemiah Settle, MD      . disulfiram (ANTABUSE) tablet 500 mg  500 mg Oral Daily Mickie D. Adams, PA   500 mg at 08/27/11 1005  . hydrOXYzine (ATARAX/VISTARIL) tablet 25 mg  25 mg Oral Q6H PRN Nehemiah Settle, MD   25 mg at 08/26/11 0000  . loperamide (IMODIUM) capsule 2-4 mg  2-4 mg Oral PRN Nehemiah Settle, MD      . magnesium hydroxide (MILK OF MAGNESIA) suspension 30 mL  30 mL Oral Daily PRN Nehemiah Settle, MD      . metoprolol tartrate (LOPRESSOR) tablet 12.5 mg  12.5 mg Oral BID Nehemiah Settle, MD      . mirtazapine (REMERON SOL-TAB) disintegrating tablet 15 mg  15 mg Oral QHS PRN Nehemiah Settle, MD   15 mg at 08/26/11 2217  . mulitivitamin with  minerals tablet 1 tablet  1 tablet Oral Daily Nehemiah Settle, MD   1 tablet at 08/27/11 1005  . ondansetron (ZOFRAN-ODT) disintegrating tablet 4 mg  4 mg Oral Q6H PRN Nehemiah Settle, MD      . pantoprazole (PROTONIX) EC tablet 40 mg  40 mg Oral BID AC Orson Aloe, MD   40 mg at 08/27/11 0644  . tetrahydrozoline 0.05 % ophthalmic solution 1 drop  1 drop Both Eyes Daily Orson Aloe, MD   1 drop at 08/27/11 1006  . thiamine (VITAMIN B-1) tablet 100 mg  100 mg Oral Daily Nehemiah Settle, MD   100 mg at 08/27/11 1003    Lab Results: No results found for this or any previous visit (from the past 48 hour(s)).  Physical Findings: AIMS:  , ,  ,  ,    CIWA:  CIWA-Ar Total: 1  COWS:     Treatment Plan Summary: Daily contact with patient to assess and evaluate symptoms and  progress in treatment Medication management  Plan: Consult Neurology about ataxia from one shot of Ativan. Finish detox and refer to residential treatment.  Taleia Sadowski 08/27/2011, 2:15 PM

## 2011-08-27 NOTE — Tx Team (Signed)
Interdisciplinary Treatment Plan Update (Adult)  Date:  08/27/2011  Time Reviewed:  11:07 AM   Progress in Treatment: Attending groups:   Yes   Participating in groups:  Yes Taking medication as prescribed:  Yes Tolerating medication:  Yes Family/Significant othe contact made: Counselor will follow up with patient regarding collateral contact Patient understands diagnosis:  Yes Discussing patient identified problems/goals with staff: Yes Medical problems stabilized or resolved: Yes Denies suicidal/homicidal ideation:Yes Issues/concerns per patient self-inventory:  Other:  New problem(s) identified:  Reason for Continuation of Hospitalization: Anxiety Depression Medication stabilization Withdrawal symptoms  Interventions implemented related to continuation of hospitalization:  Medication Management; safety checks q 15 mins  Additional comments:   Suicide prevention education reiewed  Estimated length of stay: 3-4 days  Discharge Plan:  Home with outpatient follow up  New goal(s):  Review of initial/current patient goals per problem list:   1.  Goal(s):  Detox from ETOH  Met:  No  Target date: d/c  As evidenced by: Dennis York will complete detox protocol  2.  Goal (s):  Reduce depression/anxiety (currently rated at eight and six)  Met:  Yes  Target date: d/c  As evidenced by: Will rate symptoms at four or less  3.  Goal(s):  Stabilize on medications  Met:  No  Target date: d/c  As evidenced by: Dennis York will report medications are working  4.  Goal(s):  Evaluate problems with patient's mobility  Met:  No  Target date: d/c  As evidenced by:  Dennis York will understand problems effecting his mobility  Attendees: Patient:     Family:     Physician:  Orson Aloe, MD 08/27/2011 11:07 AM   Nursing:   Gretta Arab, RN 08/27/2011 11:07 AM   CaseManager:  Juline Patch, LCSW 08/27/2011 11:07 AM   Counselor:  Angus Palms, LCSW 08/27/2011 11:07 AM   Other:   Quintella Reichert, RN 08/27/2011 11:07 AM   Other:  Reyes Ivan, LCSWA 08/27/2011  11:07 AM   Other:     Other:      Scribe for Treatment Team:   Wynn Banker, LCSW,  08/27/2011 11:07 AM

## 2011-08-27 NOTE — Progress Notes (Signed)
[facility] Group Notes:  (Counselor/Nursing/MHT/Case Management/Adjunct) 11:00   Type of Therapy:  Group Therapy  Participation Level:  Did Not Attend  [name] 08/27/2011  2:49 PM

## 2011-08-27 NOTE — Progress Notes (Signed)
[facility] Group Notes:  (Counselor/Nursing/MHT/Case Management/Adjunct)    Type of Therapy:  Group Therapy  Participation Level:  Did Not Attend   Kaelen did come to group, but left within the first 5 minutes.   [name] 08/27/2011  4:12 PM

## 2011-08-27 NOTE — Progress Notes (Signed)
Patient ID: Dennis York, male   DOB: 12-26-1940, 71 y.o.   MRN: [id number] Pt. Ambulating in hall in his wheelchair in the evening: took his HS meds and went to bed. 00:00 Pt. was found sitting in his W/C with urine dripping from it onto the floor.  Pt. was assisted to pivot onto the the toilet. Then Pt. was assisted to use the shower and then his linens were changed. Pt was able to move from the shower to his W/C and back to bed after the shower. 01:15--Pt. was noted to have spilled urine onto the floor beside his bed.  Spill was cleaned-bed not affected-Pt. Asleep. 05:00 Pt. Again found to have spilled urine in the floor; was out of bed and rummaging through his belongings to dress.

## 2011-08-27 NOTE — Progress Notes (Signed)
D:  Pt attended wrap up group this evening.  Pt was redirected on argumentative comments to peers and staff during group multiple times.  He then  apologized to group about earlier remarks.  Later, pt was redirect on the importance of wearing proper garments when having a room mate.  Re-educated pt on importance of sobriety if goal is to schedule a knee surgery upon d/c.  A:  Encouraged pt to support and have positive comments for peers in group and on the unit.  R:  Pt is safe.Aundria Rud, Kabria Hetzer L, MHT/NS

## 2011-08-27 NOTE — Progress Notes (Signed)
D:  Pt found in bathroom with urine on floor, toilet, wheelchair and his legs.  Underwriter ask pt to shower to get urine off skin.  Pt refused.  RN assisted with getting pt into shower.  Pt then began to have aggressive behaviors with teeth grinding, swinging hands to underwriters face, and yelling,"Im not getting in that shower."  A:  RN assisted underwriter to get pt showered.  R:  Pt is safe.Aundria Rud, Taiga Lupinacci L, MHT/NS

## 2011-08-28 NOTE — Progress Notes (Signed)
Patient ID: Dennis York, male   DOB: December 29, 1940, 71 y.o.   MRN: [id number] Pt. Did not get up for group, writer encouraged to get up for snack and meds. Pt. Reports depression at 6 out of 10. He denies SHI, no AVH. Writer reviewed med with patient. Staff will continue to monitor q79min for safety.

## 2011-08-28 NOTE — Progress Notes (Signed)
Pt has been observed ambulating on the unit with a wheel chair. Pt has asked this writer about what meds he is to be given at 0800. Writer has explained the meds with indication. Pt is reporting that he will refuse antabuse. Pt doesn't seem open to change on changing his alcohol consumption. Pt is denying SI at this time. Pt did attended group this evening. Support and availabilty as needed has been extended to this pt. Pt safety remains with q20min checks.

## 2011-08-28 NOTE — Progress Notes (Signed)
On patient self inventory form, sleeps well, has good appetite, low energy level, poor attention span.  Rated depresion #10, anxiety #10.  Denied SI.   Don't know how to better take care of himself after discharge.  "Guess my wife will take care of me when I go home."   Wants to be discharged, wants to go home with wife, and then find another place to go for treatment for discharge from [facility].

## 2011-08-28 NOTE — Progress Notes (Addendum)
Patient's son presented in lobby for lunch and asked to meet with Clinical research associate.  He advised that he was concerned that patient is not mentally alert, shows signs of memory loss, has tremors and is not walking at this normal pace.  Son shared that it has been two/three months since he last saw patient.  He question whether problems he was observing could be medication related.  Writer asked patient's RN to meet with son to discuss medication issues.  Son advised of wanting patient to go into a residential program.  He questioned if there is a transistional housing available to patient if awaiting admission to residential.  Writer advised son on options, and payor sources.  He was also informed patient must be serious about sobriety as programs will not put other residents at risk by accepting someone who is not serious about treatment.   Son was given writer's contact information for other questions/concerns.  He was also informed that once patient is stable, family will need to be available to assisted with continued care for discharge.  Son advised their are family members including patient's sister and another son who will be supportive at discharge.

## 2011-08-28 NOTE — Progress Notes (Signed)
Patient ID: Dennis York, male   DOB: 08/28/1940, 71 y.o.   MRN: [id number] Pt was lying in bed when I made rounds and saw him this AM.  He had been up in his wheelchair earlier in the hall, but he had gone back to bed.  His son visited and expressed deep concern that he has suddenly lost the ability to walk.  According to the patient this is the second time that this has happened after a single dose of Ativan.  Will mark his chart to the effect that he is allergic to Ativan and all the components of Ativan.  This has been reported by 159 other folks on the Internet.  They all had had their reaction while on less than a month of Ativan. His son was also intent on him going to a rehab facility to help him stop drinking.  He does not conceptualize his life without alcohol just yet.  I reminded him that his liver enzymes are 5 times what they are supposed to be.  He insists that he leave this facility within a week. He feels that he is strong enough to walk with a [name] today.  WIll allow him to try that, but with supervision.

## 2011-08-28 NOTE — Progress Notes (Signed)
Writer attempted to meet with patient who did not attend aftercare group.  He advised that he is not suicidal today and that he continues to have depression.    Per State Regulation 482.30 This chart was reviewed for medical necessity with respect to the patient's  Admission/Duration of Stay  [facility], LCSW @2 /26/2013    Next Review Date 08/31/11

## 2011-08-28 NOTE — Progress Notes (Addendum)
[facility] Group Notes:  (Counselor/Nursing/MHT/Case Management/Adjunct) 11:00am   Type of Therapy:  Group Therapy  Participation Level:  Did Not Attend    Dennis York 08/28/2011  2:16 PM

## 2011-08-29 NOTE — Progress Notes (Signed)
Pt.'s temp. 99.7  P-101  R-20  B/P 143/78 02 SAT 97% .  Pt. Given Tylenol 650 mg PO.  Pt. Lying in bed and reports being cold.  Pt. Not coughing and reports chills.  Pt. To be reassessed.

## 2011-08-29 NOTE — Progress Notes (Signed)
Patient seen during d/c planning group.  He reports doing a little better but continue to have concerns that he is not able to walk.  He was informed that his wife had called and has arranged for him to discharge to [facility] on Monday, September 03, 2011.  Patient advised that he is not interested in a residential program.  He also stated that he will arrange his outpatient follow up with therapist.  Patient was advised that insurance asked that we have appointments in place prior to discharge.  Patient agreeable to follow up being scheduled.  He also stated that he wants to discharge as soon as MD believes detox completed.

## 2011-08-29 NOTE — Consult Note (Signed)
Reason for Consult: Weakness Referring Physician: Dr. Orson Aloe  CC: Weakness lower extremities  HPI: Dennis York is an 71 y.o. male Y. here in the behavioral health for alcohol detoxification. Has been consultative for weakness in the lower extremities. The patient tells me that this started when he arrived at this facility. He was given Librium and woke up the next day unable to stand up or move his legs. The patient has been wheelchair her smokeless admission. He says that this has happened twice in the past. The first time was about 3 years ago when the patient was given Ativan for anxiety. At that time the patient woke up and was unable to move his legs. The symptoms lasted for a few days. The patient was completely back to his normal self after the episode. The second episode was about 3 weeks ago. The patient was at an outside hospital and was considering detox at that time. He was anxious because his wife wasn't with him and was again given Ativan. Patient then apparently woke up with lower extremity weakness. That episode also lasted for a few days. The patient denies any numbness or tingling anywhere. He denies a headache. The patient does not have any nausea or vomiting. He does not have any dizziness/vertigo/lightheadedness. The patient has not had any visual disturbance or speech difficulties with this. The patient has not had any loss in sensation for defecation or urination. He has some urinary incontinence but this is because he is not able to make it to the bathroom in time. The patient does have some left hip pain. In the past he was told that he had "some pinched nerves in his back". This current episode is exactly as the 2 episodes before. The patient takes a daily aspirin.    Past Medical History  Diagnosis Date  . CAD (coronary artery disease)     Prior PCI to LAD in 1994  . Alcohol abuse, in remission   . Hyperlipidemia   . HTN (hypertension)   . Anxiety   . Normal nuclear  stress test 2010    EF 77%  . Depression     Past Surgical History  Procedure Date  . Replacement total knee Feb 2010  . Cardiac catheterization 01/30/1993    EF 55-60%  . US echocardiography 09/04/2005    EF 55-60%  . Cardiovascular stress test 09/12/2010    EF 68%  . Joint replacement     No family history on file.  Social History:  reports that he quit smoking about 16 years ago. He does not have any smokeless tobacco history on file. He reports that he drinks alcohol. He reports that he does not use illicit drugs.  Allergies  Allergen Reactions  . Ativan (ZOX:WRUEAV Alcohol+Lorazepam+Polyethylene Glycol+Propylene Glycol)     ATAXIA LASTING FOR SEVERAL DAYS  . Barbiturates     REACTION: Reaction not known  . Penicillins     REACTION: Reaction not known    Medications: I have reviewed the patient's current medications.  ROS: The patient denies any chest pain, abdominal pain, fever or chills. He denies shortness of breath, diaphoresis, palpitations. The patient did not have a cough or diarrhea. He does not have any blood in his bowel movements or urine. 6 Physical Examination: Blood pressure 143/78, pulse 101, temperature 99.7 F (37.6 C), temperature source Oral, resp. rate 20, height 5' 8.5" (1.74 m), weight 85.276 kg (188 lb), SpO2 97.00%. General: The patient is alert and oriented x3. Non-any  apparent distress. Cardiovascular: Heart tones S1 and S2 are heard no S3/S4/murmurs/carotid bruits Lungs: Clear to auscultation bilaterally in the front fields abdomen: Soft Neurologic Examination Naming/repetition/comprehension intact and speech is fluent. Funduscopic exam is normal bilaterally with sharp optic disc edges and venous pulsations. Pupils are equal round and reactive to light. Extraocular movements are intact. No visual field deficits. V1 through V3 intact to soft touch and pinprick. No facial droop. Hearing grossly intact. Pharynx arch symmetric in stand and elevation.  SCM/trapezius 5/5. Tongue midline with normal movements. Motor: Upper extremities 5/5, no pronator drift. Lower extremities: Proximal muscles including at adductors and abductors 5/5 bilaterally. Gastrocnemius and tibialis anterior 1-2/5 bilaterally -there is a question of effort. During history taking the patient was able to bear on both his feet. Normal tone Sensory: Intact to soft touch and pinprick all 4 limbs Reflexes: Diminished overall Coordination: Finger-nose-finger within normal limits. Unable to do heel-knee-shin  No results found for this or any previous visit (from the past 48 hour(s)).  No results found for this or any previous visit (from the past 240 hour(s)).  No results found.   Assessment/Plan: 71 year old white male with past medical history of coronary artery disease hyperlipidemia hypertension depression and alcohol dependence admitted to the psychiatric unit for detoxification and now sudden onset lower extremity weakness after benzodiazepines, and third such episode  for this patient. Physical exam shows bilateral distal lower extremity weakness with questionable effort and inconsistent exam. Considering the patient has risk factors for stroke head. The patient has been taking a daily aspirin with good adherence.  1. MRI brain, MRA head 2. Check labs including hemoglobin A1c and thyroid  3. Physical therapy and occupational therapy 4. Continue aspirin for now  [name] Christophe Louis, MD [facility]  08/29/2011, 8:04 PM

## 2011-08-29 NOTE — Progress Notes (Signed)
08/29/2011         Time: 1415      Group Topic/Focus: The focus of this group is on promoting emotional and psychological well-being through the process of creative expression, relaxation, socialization, fun and enjoyment.   Participation Level: Active  Participation Quality: Attentive and Sharing  Affect: Blunted  Cognitive: Confused   Additional Comments: Patient started off group by complaining about other groups, saying he hasn't understood anything discussed today. Patient says he has a Masters degree and is frustrated he couldn't comprehend the [employer] used in therapy group. Patient able to address his concerns clearly, but had great difficulty following the rules in the activity and even tried to continue to play the game once the rest of the group had stopped and sat back down.   Arlis Everly 08/29/2011 3:48 PM

## 2011-08-29 NOTE — Progress Notes (Signed)
Patient ID: Dennis York, male   DOB: 10/23/1940, 71 y.o.   MRN: [id number] Pt refused to take his Antabuse again this AM.  He refuses to take it because he doesn't want to to be forced to not drink.  His wife has moved out and reportedly has paid for all the detox/rehab that she is going to pay for.  She expects him to pay for anymore.  She also loves him very much, but can no longer live with him continuing to drink any alcohol of any sort what so ever.  She plans to move back in with him as soon as he goes 2 months without drinking.  She may or may not want to let him know exactly her cut off time frame for his not drinking. His son [name] has been concerned about him not being able to walk and having slowed thinking since the son last saw the patient a couple of months ago. He rambles around the ward in a wheel chair.  I had requested that he be given a Adriann Thau, and have reminded staff of that request. I have consulted a Dr Lyman Speller, neurologist about him.  Dr Lyman Speller called me back and said that he or someone would be seeing him soon.  I am informed that Dr Lyman Speller is on call at [phone] until 1900 this evening. Today in the consult room, the patient says that he is scared that he may not walk again.  He was reminded that according to his recall he had recovered from the ataxia from Ativan before.  He was reminded that he regained his strength after about 10 days.  He has now completed his Librium detox and feels that he had gained some strength.   It could make some sense for him to D/C tomorrow AM and then go back to [facility] like he had been to recently. He left there because he did not like the 4 videos (dated from the 1940's and the 1950's) a day he got there.  He is agreeable to going to [employer], but seems clueless about what he will have to actually do to quit drinking altogether.

## 2011-08-29 NOTE — Progress Notes (Signed)
Pt laying in bed resting with eyes closed. Respirations even and unlabored. No distress noted.  

## 2011-08-29 NOTE — Progress Notes (Signed)
[facility] Group Notes: (Counselor/Nursing/MHT/Case Management/Adjunct) 08/29/2011   @1 :15pm  Type of Therapy:  Group Therapy  Participation Level:  None  Participation Quality:  None  Affect:  Blunted, Irritable  Cognitive:  Appropriate  Insight:  None  Engagement in Group: None   Engagement in Therapy:  None  Modes of Intervention:  Support and Exploration  Summary of Progress/Problems: Dennis York  was attentive but not engaged in group process. Counselor had to coach him into turning around to face the rest of the group rather than the wall.  [name] 08/29/2011 3:29 PM

## 2011-08-29 NOTE — Progress Notes (Signed)
[facility] Group Notes:  (Counselor/Nursing/MHT/Case Management/Adjunct)  08/29/2011 1:15 PM  Type of Therapy:  Group Therapy  Participation Level:  Minimal  Participation Quality:  Attentive  Affect:  Flat  Cognitive:  Oriented  Insight:  None  Engagement in Group:  None  Engagement in Therapy:  None  Modes of Intervention:  Education and Exploration  Summary of Progress/Problems: Zafar helped to explain Agoraphobia to one of his peers.   Wilmon Arms 08/29/2011, 1:15 PM  Cosigned by: Angus Palms, LCSW

## 2011-08-29 NOTE — Progress Notes (Signed)
Patient ID: Dennis York, male   DOB: April 23, 1941, 71 y.o.   MRN: [id number]   Patient irritable on approach but cooperative. Currently denies any withdrawal symptoms from alcohol at present. Using wheelchair for ambulation d/t weakness at present. Did refuse antabuse after asking him several times about it. Temp was slightly elevated this am at 100.3. Taken again at 0930 and was 98.3. Will recheck at noon today. Denies depression or SI today. Staff will monitor and encourage group attendance.

## 2011-08-30 ENCOUNTER — Encounter: Payer: Medicare Other | Admitting: Cardiology

## 2011-08-30 LAB — COMPREHENSIVE METABOLIC PANEL
ALT: 30 U/L (ref 0–53)
AST: 48 U/L — ABNORMAL HIGH (ref 0–37)
Albumin: 2.8 g/dL — ABNORMAL LOW (ref 3.5–5.2)
Chloride: 101 mEq/L (ref 96–112)
Creatinine, Ser: 0.92 mg/dL (ref 0.50–1.35)
Potassium: 3.2 mEq/L — ABNORMAL LOW (ref 3.5–5.1)
Sodium: 137 mEq/L (ref 135–145)
Total Bilirubin: 1 mg/dL (ref 0.3–1.2)

## 2011-08-30 LAB — HEMOGLOBIN A1C: Hgb A1c MFr Bld: 5.6 % (ref <5.7)

## 2011-08-30 LAB — T3, FREE: T3, Free: 2.4 pg/mL (ref 2.3–4.2)

## 2011-08-30 MED ORDER — OMEPRAZOLE MAGNESIUM 20 MG PO TBEC: 20.0000 mg | DELAYED_RELEASE_TABLET | Freq: Every day | ORAL | Status: DC

## 2011-08-30 MED ORDER — METOPROLOL TARTRATE 25 MG PO TABS: 12.5000 mg | ORAL_TABLET | Freq: Two times a day (BID) | ORAL | Status: DC

## 2011-08-30 MED ORDER — TETRAHYDROZOLINE HCL 0.05 % OP SOLN: 1.0000 [drp] | Freq: Every day | OPHTHALMIC | Status: DC

## 2011-08-30 MED ORDER — ADULT MULTIVITAMIN W/MINERALS CH: 1.0000 | ORAL_TABLET | Freq: Every day | ORAL | Status: DC

## 2011-08-30 MED ORDER — MIRTAZAPINE 15 MG PO TBDP: 15.0000 mg | ORAL_TABLET | Freq: Every evening | ORAL | Status: DC | PRN

## 2011-08-30 MED ORDER — ASPIRIN 81 MG PO TABS: 81.0000 mg | ORAL_TABLET | Freq: Every day | ORAL | Status: DC

## 2011-08-30 MED ORDER — DISULFIRAM 250 MG PO TABS: 500.0000 mg | ORAL_TABLET | Freq: Every day | ORAL | Status: DC

## 2011-08-30 MED ORDER — AMLODIPINE BESYLATE 5 MG PO TABS: 5.0000 mg | ORAL_TABLET | Freq: Every day | ORAL | Status: DC

## 2011-08-30 MED ORDER — ACAMPROSATE CALCIUM 333 MG PO TBEC: 666.0000 mg | DELAYED_RELEASE_TABLET | Freq: Three times a day (TID) | ORAL | Status: DC

## 2011-08-30 MED ORDER — THIAMINE HCL 100 MG PO TABS: 100.0000 mg | ORAL_TABLET | Freq: Every day | ORAL | Status: DC

## 2011-08-30 NOTE — Discharge Instructions (Signed)
Attend 90 meetings in 90 days. Get trusted sponsor from the advise of others or from whomever in meetings seems to make sense, has a proven track record, will hold you responsible for your sobriety, and both expects and insists on total abstinence.  Work the steps HONESTLY with the trusted sponsor. Get obsessed with your recovery by often reminding yourself of how DEADLY this dredged horrible disease of addiction JUST IS. Focus the first month on speaker meetings where you specifically look at how your life has been wrecked by drugs/alcohol and how your life has been similar to that of the speakers.   

## 2011-08-30 NOTE — Progress Notes (Signed)
[facility] Group Notes:  (Counselor/Nursing/MHT/Case Management/Adjunct)  08/30/2011 12:35 PM  Type of Therapy:  Group Therapy  Participation Level:  Did Not Attend  Wilmon Arms 08/30/2011, 12:35 PM Cosigned by: Angus Palms, LCSW

## 2011-08-30 NOTE — Progress Notes (Signed)
Patient ID: Dennis York, male   DOB: 02-Dec-1940, 71 y.o.   MRN: [id number] Patient was pleasant and cooperative during the assessment. "I feel relaxed. I'm not hungry, not nervous, but I still have some trouble walking". Stated that he feels a little stronger, and a little more energy.   Pt had a neuro consult done this evening. Informed the writer that the neurologist "did not know what she was talking about. She wants me to have a MRI and I'm not gonna have one because they cost about $15,000." Pt stated he was going to have a 2nd opinion. Then stated that every time he takes ativan he "falls apart mentally not physically".  The writer attempted to get the pt to explain the relationship between his inability to walk with his ativan use. However, pt was unable to explain. Support and encouragement was offered.

## 2011-08-30 NOTE — Progress Notes (Addendum)
[facility] Case Management Discharge Plan:  Will you be returning to the same living situation after discharge: Yes,  Dennis York will be returning to his home. At discharge, do you have transportation home?:Yes,  Patient will arrange for family to transport him home Do you have the ability to pay for your medications:Yes,  Patient can afford medicaitons  Interagency Information:     Release of information consent forms completed and in the chart;  Patient's signature needed at discharge.  Patient to Follow up at:  [facility]     Monday, September 10, 2011 @ 2:00 p.m.        [address]                   [place], [place]  [address]    Patient denies SI/HI:   Yes,  Patient is denying SI    Safety Planning and Suicide Prevention discussed:  Yes,  Suicide prevention reviewed during aftercare group on 08/27/11  Barrier to discharge identified:Yes,  Patient reports continued problems with mobility  Summary and Recommendations: Patient advised that he will not follow up on MD recommendation for MRI or for taking antabuse.  He also declined residential treatment his wife had arranged at [facility] for Monday, 09/03/11   Passion Lavin Hairston 08/30/2011, 11:10 AM

## 2011-08-30 NOTE — Progress Notes (Signed)
[facility] Group Notes:  (Counselor/Nursing/MHT/Case Management/Adjunct)  08/30/2011 3:05 PM  Type of Therapy:  1:15PM Group Therapy  Participation Level:  Did Not Attend  Wilmon Arms 08/30/2011, 3:05 PM  Cosigned by: Angus Palms, LCSW

## 2011-08-30 NOTE — Progress Notes (Signed)
Discharge Note:   Family member picked up patient to take to stay with him.  Patient denied SI/HI.   Denied A/V hallucinations.   Denied pain.    Suicide prevention information was given to patient, discussed with patient, who stated he understood and had no questions.   Patient stated he appreciated all the staff has done to assist him while at [facility].

## 2011-08-30 NOTE — Tx Team (Signed)
Interdisciplinary Treatment Plan Update (Adult)  Date:  08/30/2011  Time Reviewed:  10:21 AM   Progress in Treatment: Attending groups:   Yes   Participating in groups:  Yes Taking medication as prescribed:  Yes Tolerating medication:  Yes Family/Significant othe contact made: Contact has been made with wife and son Patient understands diagnosis:  Yes Discussing patient identified problems/goals with staff: Yes Medical problems stabilized or resolved: Yes Denies suicidal/homicidal ideation:Yes Issues/concerns per patient self-inventory: None identified Other:  New problem(s) identified:  Reason for Continuation of Hospitalization:  Interventions implemented related to continuation of hospitalization:  Additional comments:  Estimated length of stay:  Discharge home today  Discharge Plan: Discharge home with follow up with outpatient therapist  New goal(s):  Review of initial/current patient goals per problem list:    1.  Goal(s):  Complete Detox  Met:  Yes  Target date: d/c  As evidenced by:  Detox has been completed  2.  Goal (s):  Reduce depression/anxiety (rated at eight and six on admission)  Met:  Yes  Target date: d/c  As evidenced by: Patient denying depression/anxiety today  3.  Goal(s):  Stabilize on medication  Met:  Yes  Target date: d/c  As evidenced by: Patient denying symptoms that led to hospitalizaton  4.  Goal(s):  Evaluate problems with mobility  Met:  Yes  Target date: d/c  As evidenced by:  Patient reports being better but continues to have problems (advised he will follow up with MD outside of hospital  Attendees: Patient:  Dennis York 08/30/2011 10:32 AM  Family:     Physician:  Orson Aloe, MD 08/30/2011 10:21 AM   Nursing:   Quintella Reichert, RN 08/30/2011 10:21 AM   CaseManager:  Juline Patch, LCSW 08/30/2011 10:21 AM   Counselor:  Angus Palms, LCSW 08/30/2011 10:21 AM   Other:  Consuello Bossier, NP 08/30/2011 10:21 AM     Other:  Reyes Ivan, LCSWA 08/30/2011  10:21 AM   Other:  Annetta Maw, MSW Intern 08/30/2011 10:22 AM  Other:  Angela Cox Dax 08/30/2011 10:22 AM   Scribe for Treatment Team:   Wynn Banker, LCSW,  08/30/2011 10:21 AM

## 2011-08-30 NOTE — Progress Notes (Signed)
On patient's self inventory sheet, patient has poor sleep, poor appetite, low energy level, poor attention span.   Rated depression #5, hopelessness #7.  Has experienced tremors in past 24 hours.  Denied SI.  No pain in past 24 hours.  After discharge, plans to go to all his doctor appointments and take all his scheduled medications.   No questions for staff.  Plans to go home after discharge, no problems taking meds after discharge. Patient has been cooperative and pleasant today.

## 2011-08-30 NOTE — [facility] Suicide Risk Assessment (Addendum)
Suicide Risk Assessment  Discharge Assessment     Demographic factors:    Current Mental Status:    Risk Reduction Factors:     CLINICAL FACTORS:   Severe Anxiety and/or Agitation Depression:   Anhedonia Comorbid alcohol abuse/dependence Hopelessness Alcohol/Substance Abuse/Dependencies Previous Psychiatric Diagnoses and Treatments Medical Diagnoses and Treatments/Surgeries  COGNITIVE FEATURES THAT CONTRIBUTE TO RISK:  Closed-mindedness Polarized thinking Thought constriction (tunnel vision)    SUICIDE RISK:   Minimal: No identifiable suicidal ideation.  Patients presenting with no risk factors but with morbid ruminations; may be classified as minimal risk based on the severity of the depressive symptoms  ADL's:  Intact  Sleep: Good  Appetite:  Good  Suicidal Ideation:  Denies adamantly any suicidal thoughts. Homicidal Ideation:  Denies adamantly any homicidal thoughts.  Mental Status Examination/Evaluation: Objective:  Appearance: Casual  Eye Contact::  Good  Speech:  Clear and Coherent  Volume:  Normal  Mood:  Euthymic  Affect:  Congruent  Thought Process:  Coherent  Orientation:  Full  Thought Content:  WDL  Suicidal Thoughts:  No  Homicidal Thoughts:  No  Memory:  Immediate;   Good  Judgement:  Poor when it comes to seeing his problem is his drinking  Insight:  Totally lacking when it comes to seeing that his alcoholism is his problem  Psychomotor Activity:  Normal non ambulatory thought related to his exposure to Ativan  Concentration:  Good  Recall:  Good  Akathisia:  No  AIMS (if indicated):     Assets:  Communication Skills Desire for Improvement Financial Resources/Insurance Housing Resilience Social Support Talents/Skills  Sleep: Number of Hours: 5.5    Vital Signs: Blood pressure 160/83, pulse 84, temperature 98.7 F (37.1 C), temperature source Oral, resp. rate 16, height 5' 8.5" (1.74 m), weight 85.276 kg (188 lb), SpO2  95.00%.  Labs Results for orders placed during the hospital encounter of 08/25/11 (from the past 48 hour(s))  COMPREHENSIVE METABOLIC PANEL     Status: Abnormal   Collection Time   08/30/11  6:30 AM      Component Value Range Comment   Sodium 137  135 - 145 (mEq/L)    Potassium 3.2 (*) 3.5 - 5.1 (mEq/L)    Chloride 101  96 - 112 (mEq/L)    CO2 25  19 - 32 (mEq/L)    Glucose, Bld 92  70 - 99 (mg/dL)    BUN 12  6 - 23 (mg/dL)    Creatinine, Ser 4.09  0.50 - 1.35 (mg/dL)    Calcium 7.9 (*) 8.4 - 10.5 (mg/dL)    Total Protein 6.8  6.0 - 8.3 (g/dL)    Albumin 2.8 (*) 3.5 - 5.2 (g/dL)    AST 48 (*) 0 - 37 (U/L)    ALT 30  0 - 53 (U/L)    Alkaline Phosphatase 94  39 - 117 (U/L)    Total Bilirubin 1.0  0.3 - 1.2 (mg/dL)    GFR calc non Af Amer 82 (*) >90 (mL/min)    GFR calc Af Amer >90  >90 (mL/min)     What pt has learned from hospital stay is that his liver enzymes were elevated.  Risk of self harm is elevated by his history of alcohol abuse, but he feels that he has a good life to look forward to.  Risk of harm to others is minimal in that he has not been involved in fights or had any legal charges filed on him.  PLAN: Discharge  home Has reached maximum benefit from any [facility] stays in that he has never admitted that his problem is related to alcohol and that his liver enzyme elevation is a sign that he must stop all of his drinking.  He asks how long the effect of Antabuse lasts if he decided to experiment with stopping hit and the restarting his use of alcohol. Continue Medication List  As of 08/30/2011  3:04 PM   TAKE these medications         acamprosate 333 MG tablet   Commonly known as: CAMPRAL   Take 2 tablets (666 mg total) by mouth 3 (three) times daily with meals. To reduce cravings for alcohol      amLODipine 5 MG tablet   Commonly known as: NORVASC   Take 1 tablet (5 mg total) by mouth daily. For control of high blood pressure      aspirin 81  MG tablet   Take 1 tablet (81 mg total) by mouth daily. For prevention of stroke and heart attack      disulfiram 250 MG tablet   Commonly known as: ANTABUSE   Take 2 tablets (500 mg total) by mouth daily. To deter one from continuing use of alcohol      metoprolol tartrate 25 MG tablet   Commonly known as: LOPRESSOR   Take 0.5 tablets (12.5 mg total) by mouth 2 (two) times daily. For control of high blood pressure      mirtazapine 15 MG disintegrating tablet   Commonly known as: REMERON SOL-TAB   Take 1 tablet (15 mg total) by mouth at bedtime as needed (As needed for sleep). For insomnia      mulitivitamin with minerals Tabs   Take 1 tablet by mouth daily. For nutritional supplementation.      omeprazole 20 MG tablet   Commonly known as: PRILOSEC OTC   Take 1 tablet (20 mg total) by mouth daily. For control of stomach acid secretion and helps GERD.      tetrahydrozoline 0.05 % ophthalmic solution   Place 1 drop into both eyes daily. For reduction of eye redness      thiamine 100 MG tablet   Take 1 tablet (100 mg total) by mouth daily. For nutritional supplementation.           Shaylie Eklund 08/30/2011, 3:03 PM

## 2011-09-01 ENCOUNTER — Emergency Department (HOSPITAL_COMMUNITY): Payer: Medicare Other

## 2011-09-01 ENCOUNTER — Inpatient Hospital Stay (HOSPITAL_COMMUNITY)
Admission: EM | Admit: 2011-09-01 | Discharge: 2011-09-05 | DRG: 641 | Disposition: A | Discharge status: Skilled Nursing Facility | Payer: Medicare Other | Attending: Family Medicine | Admitting: Family Medicine

## 2011-09-01 DIAGNOSIS — R6889 Other general symptoms and signs: Secondary | ICD-10-CM | POA: Diagnosis not present

## 2011-09-01 DIAGNOSIS — E860 Dehydration: Secondary | ICD-10-CM

## 2011-09-01 DIAGNOSIS — R5381 Other malaise: Secondary | ICD-10-CM | POA: Diagnosis present

## 2011-09-01 DIAGNOSIS — M109 Gout, unspecified: Secondary | ICD-10-CM | POA: Diagnosis present

## 2011-09-01 DIAGNOSIS — R262 Difficulty in walking, not elsewhere classified: Secondary | ICD-10-CM | POA: Diagnosis present

## 2011-09-01 DIAGNOSIS — F10239 Alcohol dependence with withdrawal, unspecified: Secondary | ICD-10-CM

## 2011-09-01 DIAGNOSIS — F1020 Alcohol dependence, uncomplicated: Secondary | ICD-10-CM | POA: Diagnosis present

## 2011-09-01 DIAGNOSIS — I10 Essential (primary) hypertension: Secondary | ICD-10-CM | POA: Diagnosis present

## 2011-09-01 DIAGNOSIS — F3289 Other specified depressive episodes: Secondary | ICD-10-CM | POA: Diagnosis present

## 2011-09-01 DIAGNOSIS — E876 Hypokalemia: Principal | ICD-10-CM | POA: Diagnosis present

## 2011-09-01 DIAGNOSIS — R509 Fever, unspecified: Secondary | ICD-10-CM | POA: Diagnosis not present

## 2011-09-01 DIAGNOSIS — R531 Weakness: Secondary | ICD-10-CM | POA: Diagnosis present

## 2011-09-01 DIAGNOSIS — I2510 Atherosclerotic heart disease of native coronary artery without angina pectoris: Secondary | ICD-10-CM | POA: Diagnosis present

## 2011-09-01 DIAGNOSIS — E785 Hyperlipidemia, unspecified: Secondary | ICD-10-CM | POA: Diagnosis present

## 2011-09-01 DIAGNOSIS — F411 Generalized anxiety disorder: Secondary | ICD-10-CM | POA: Diagnosis present

## 2011-09-01 DIAGNOSIS — F329 Major depressive disorder, single episode, unspecified: Secondary | ICD-10-CM | POA: Diagnosis present

## 2011-09-01 LAB — DIFFERENTIAL
Lymphocytes Relative: 31 % (ref 12–46)
Monocytes Absolute: 1.5 10*3/uL — ABNORMAL HIGH (ref 0.1–1.0)
Monocytes Relative: 18 % — ABNORMAL HIGH (ref 3–12)
Neutro Abs: 4.1 10*3/uL (ref 1.7–7.7)

## 2011-09-01 LAB — CBC
HCT: 31.5 % — ABNORMAL LOW (ref 39.0–52.0)
Hemoglobin: 11.3 g/dL — ABNORMAL LOW (ref 13.0–17.0)
MCHC: 35.9 g/dL (ref 30.0–36.0)
WBC: 8.2 10*3/uL (ref 4.0–10.5)

## 2011-09-01 LAB — PROCALCITONIN: Procalcitonin: 0.2 ng/mL

## 2011-09-01 LAB — BASIC METABOLIC PANEL
BUN: 6 mg/dL (ref 6–23)
CO2: 22 mEq/L (ref 19–32)
Chloride: 97 mEq/L (ref 96–112)
Creatinine, Ser: 0.99 mg/dL (ref 0.50–1.35)
Glucose, Bld: 123 mg/dL — ABNORMAL HIGH (ref 70–99)

## 2011-09-01 LAB — RAPID URINE DRUG SCREEN, HOSP PERFORMED
Amphetamines: NOT DETECTED
Barbiturates: NOT DETECTED

## 2011-09-01 LAB — URINE MICROSCOPIC-ADD ON

## 2011-09-01 LAB — URINALYSIS, ROUTINE W REFLEX MICROSCOPIC
Bilirubin Urine: NEGATIVE
Glucose, UA: NEGATIVE mg/dL
Ketones, ur: NEGATIVE mg/dL
pH: 7 (ref 5.0–8.0)

## 2011-09-01 LAB — LACTIC ACID, PLASMA: Lactic Acid, Venous: 1.5 mmol/L (ref 0.5–2.2)

## 2011-09-01 LAB — ETHANOL: Alcohol, Ethyl (B): <11 mg/dL (ref 0–11)

## 2011-09-01 MED ORDER — POTASSIUM CHLORIDE 10 MEQ/100ML IV SOLN
10.0000 meq | Freq: Once | INTRAVENOUS | Status: AC
  Administered 2011-09-02: 10 meq via INTRAVENOUS
  Filled 2011-09-01 (×2): qty 100

## 2011-09-01 MED ORDER — ACETAMINOPHEN 325 MG PO TABS
650.0000 mg | ORAL_TABLET | Freq: Once | ORAL | Status: AC
  Administered 2011-09-01: 650 mg via ORAL
  Filled 2011-09-01: qty 2

## 2011-09-01 MED ORDER — SODIUM CHLORIDE 0.9 % IV BOLUS (SEPSIS)
1000.0000 mL | Freq: Once | INTRAVENOUS | Status: AC
  Administered 2011-09-01: 1000 mL via INTRAVENOUS

## 2011-09-01 MED ORDER — DIAZEPAM 5 MG/ML IJ SOLN
5.0000 mg | Freq: Once | INTRAMUSCULAR | Status: AC
  Administered 2011-09-01: 5 mg via INTRAVENOUS
  Filled 2011-09-01: qty 2

## 2011-09-01 NOTE — ED Provider Notes (Signed)
History     CSN: [id number]  Arrival date & time 09/01/11  1950   First MD Initiated Contact with Patient 09/01/11 2002      Chief Complaint  Patient presents with  . Alcohol Problem    continued ETOH abuse, here recently for similar, wife left two weeks ago, family cleaned out place of all alcohol, arrived.     Patient is a 71 y.o. male presenting with alcohol problem. The history is provided by the patient and a relative.  Alcohol Problem This is a chronic problem. Episode onset: an unknown time ago. The problem occurs constantly. The problem has been gradually worsening. Pertinent negatives include no chest pain, no abdominal pain, no headaches and no shortness of breath. The symptoms are aggravated by nothing. The symptoms are relieved by nothing.  nothing improves his symptoms Nothing worsens his symptoms  pt presents from home for confusion, concern for problem with ETOH Pt with recent admission to behavioral health for ETOH use, but left against advice about 2 days ago Today his family found at home disheveled and confused It was noted he had a fever as well  Pt denies ETOH use since leaving the hospital He denies headache/cp/sob/abd pain Denies vomiting/diarrhea    Past Medical History  Diagnosis Date  . CAD (coronary artery disease)     Prior PCI to LAD in 1994  . Alcohol abuse, in remission   . Hyperlipidemia   . HTN (hypertension)   . Anxiety   . Normal nuclear stress test 2010    EF 77%  . Depression     Past Surgical History  Procedure Date  . Replacement total knee Feb 2010  . Cardiac catheterization 01/30/1993    EF 55-60%  . US echocardiography 09/04/2005    EF 55-60%  . Cardiovascular stress test 09/12/2010    EF 68%  . Joint replacement     No family history on file.  History  Substance Use Topics  . Smoking status: Former Smoker    Quit date: 02/28/1995  . Smokeless tobacco: Not on file  . Alcohol Use: 0.0 oz/week    4-6 Cans of beer per  week      Review of Systems  Respiratory: Negative for shortness of breath.   Cardiovascular: Negative for chest pain.  Gastrointestinal: Negative for abdominal pain.  Neurological: Negative for headaches.  All other systems reviewed and are negative.    Allergies  Ativan; Barbiturates; and Penicillins  Home Medications   Current Outpatient Rx  Name Route Sig Dispense Refill  . ACAMPROSATE CALCIUM 333 MG PO TBEC Oral Take 2 tablets (666 mg total) by mouth 3 (three) times daily with meals. To reduce cravings for alcohol 120 tablet 0  . AMLODIPINE BESYLATE 5 MG PO TABS Oral Take 1 tablet (5 mg total) by mouth daily. For control of high blood pressure 30 tablet 0  . ASPIRIN 81 MG PO TABS Oral Take 1 tablet (81 mg total) by mouth daily. For prevention of stroke and heart attack 30 tablet 0  . DISULFIRAM 250 MG PO TABS Oral Take 2 tablets (500 mg total) by mouth daily. To deter one from continuing use of alcohol 60 tablet 0  . METOPROLOL TARTRATE 25 MG PO TABS Oral Take 0.5 tablets (12.5 mg total) by mouth 2 (two) times daily. For control of high blood pressure 30 tablet 0    Dr. Deborah Chalk has retired. Further refills will be b ...  . MIRTAZAPINE 15 MG PO TBDP  Oral Take 1 tablet (15 mg total) by mouth at bedtime as needed (As needed for sleep). For insomnia 30 tablet 0  . ADULT MULTIVITAMIN W/MINERALS CH Oral Take 1 tablet by mouth daily. For nutritional supplementation. 30 tablet 0  . OMEPRAZOLE MAGNESIUM 20 MG PO TBEC Oral Take 1 tablet (20 mg total) by mouth daily. For control of stomach acid secretion and helps GERD. 30 tablet 0  . TETRAHYDROZOLINE HCL 0.05 % OP SOLN Both Eyes Place 1 drop into both eyes daily. For reduction of eye redness 15 mL 0  . THIAMINE HCL 100 MG PO TABS Oral Take 1 tablet (100 mg total) by mouth daily. For nutritional supplementation. 30 tablet 0    BP 153/82  Pulse 117  Temp(Src) 102.6 F (39.2 C) (Oral)  Resp 20  SpO2 94%  Physical  Exam CONSTITUTIONAL: Well developed/well nourished HEAD AND FACE: Normocephalic/atraumatic EYES: EOMI/PERRL, no nystagmus ENMT: Mucous membranes dry NECK: supple no meningeal signs SPINE:entire spine nontender CV: tachycardic S1/S2 noted, no murmurs/rubs/gallops noted LUNGS: Lungs are clear to auscultation bilaterally, no apparent distress ABDOMEN: soft, nontender, no rebound or guarding GU:no cva tenderness NEURO: Pt is awake/alert, moves all extremitiesx4.  He is oriented to person/place but unable to recall todays date.  He knows his birthday.  He recognizes family.  No gross motor deficits EXTREMITIES: pulses normal, full ROM.  Tremor noted in bilateral UE SKIN: warm, color normal, no signs of cellulitis is noted PSYCH: anxious  ED Course  Procedures   CRITICAL CARE Performed by: Joya Gaskins   Total critical care time: 37  Critical care time was exclusive of separately billable procedures and treating other patients.  Critical care was necessary to treat or prevent imminent or life-threatening deterioration.  Critical care was time spent personally by me on the following activities: development of treatment plan with patient and/or surrogate as well as nursing, discussions with consultants, evaluation of patient's response to treatment, examination of patient, obtaining history from patient or surrogate, ordering and performing treatments and interventions, ordering and review of laboratory studies, ordering and review of radiographic studies, pulse oximetry and re-evaluation of patient's condition.   Labs Reviewed  CBC - Abnormal; Notable for the following:    RBC 3.17 (*)    Hemoglobin 11.3 (*)    HCT 31.5 (*)    MCH 35.6 (*)    All other components within normal limits  DIFFERENTIAL - Abnormal; Notable for the following:    Monocytes Relative 18 (*)    Monocytes Absolute 1.5 (*)    All other components within normal limits  BASIC METABOLIC PANEL  URINALYSIS,  ROUTINE W REFLEX MICROSCOPIC  URINE CULTURE  ETHANOL  URINE RAPID DRUG SCREEN (HOSP PERFORMED)  LACTIC ACID, PLASMA  PROCALCITONIN  CULTURE, BLOOD (ROUTINE X 2)  CULTURE, BLOOD (ROUTINE X 2)   Dg Chest Port 1 View  09/01/2011  *RADIOLOGY REPORT*  Clinical Data: Fevers  PORTABLE CHEST - 1 VIEW  Comparison: 11/04/2010  Findings: The heart size and mediastinal contours are within normal limits.  Both lungs are clear.  The visualized skeletal structures are unremarkable.  IMPRESSION: Negative exam.  Original Report Authenticated By: Rosealee Albee, M.D.   8:46 PM Pt with recent admit to [facility], left two days ago It was noted on chart that he had low grade fever at that time   Has tremor,feel there is a component of  ETOH withdrawal Family admits that some of his confusion proceeded [facility] admission  10:06 PM Checked  on patient several times He is awake/alert, denies headache Did have fever initially/tachycardia/hypertension but improved with valium Denies recent ETOH and has been out of [facility] For two days.  ?etoh withdrawal.  Also, family reports he has had confusion proceeding [facility] Admission.  I doubt meningitis at this time.   IV potassium ordered  10:48 PM D/w dr Joneen Roach, to admit  MDM  Nursing notes reviewed and considered in documentation All labs/vitals reviewed and considered xrays reviewed and considered Previous records reviewed and considered     Date: 09/01/2011  Rate: 115  Rhythm: sinus tachycardia  QRS Axis: normal  Intervals: normal  ST/T Wave abnormalities: nonspecific ST changes  Conduction Disutrbances:none Poor quality noted         Joya Gaskins, MD 09/01/11 2249

## 2011-09-01 NOTE — ED Notes (Signed)
Pt's family member Naomi Fitton) leaves a contact number:  [phone].

## 2011-09-01 NOTE — ED Notes (Signed)
ZOX:WRUE<AV> Expected date:09/01/11<BR> Expected time: 7:44 PM<BR> Means of arrival:Ambulance<BR> Comments:<BR> M241. 71 yo m. Confused. eoth on board and alcohol abuse/dependency. 8 mins

## 2011-09-02 ENCOUNTER — Encounter (HOSPITAL_COMMUNITY): Payer: Self-pay | Admitting: Family Medicine

## 2011-09-02 DIAGNOSIS — R531 Weakness: Secondary | ICD-10-CM | POA: Diagnosis present

## 2011-09-02 DIAGNOSIS — E876 Hypokalemia: Secondary | ICD-10-CM | POA: Diagnosis present

## 2011-09-02 LAB — IRON AND TIBC
Iron: 13 ug/dL — ABNORMAL LOW (ref 42–135)
TIBC: 147 ug/dL — ABNORMAL LOW (ref 215–435)

## 2011-09-02 LAB — CBC
Platelets: 225 10*3/uL (ref 150–400)
RDW: 12.6 % (ref 11.5–15.5)
WBC: 4.9 10*3/uL (ref 4.0–10.5)

## 2011-09-02 LAB — PHOSPHORUS: Phosphorus: 1.2 mg/dL — ABNORMAL LOW (ref 2.3–4.6)

## 2011-09-02 LAB — BASIC METABOLIC PANEL
Calcium: 6.9 mg/dL — ABNORMAL LOW (ref 8.4–10.5)
Chloride: 101 mEq/L (ref 96–112)
Creatinine, Ser: 0.91 mg/dL (ref 0.50–1.35)
GFR calc Af Amer: >90 mL/min (ref >90)
GFR calc Af Amer: >90 mL/min (ref >90)
GFR calc non Af Amer: 83 mL/min — ABNORMAL LOW (ref >90)
Potassium: 3.5 mEq/L (ref 3.5–5.1)

## 2011-09-02 LAB — URIC ACID: Uric Acid, Serum: 5.8 mg/dL (ref 4.0–7.8)

## 2011-09-02 LAB — MAGNESIUM: Magnesium: 1 mg/dL — ABNORMAL LOW (ref 1.5–2.5)

## 2011-09-02 LAB — RETICULOCYTES
Retic Count, Absolute: 32.9 10*3/uL (ref 19.0–186.0)
Retic Ct Pct: 1.2 % (ref 0.4–3.1)

## 2011-09-02 MED ORDER — POTASSIUM CHLORIDE CRYS ER 20 MEQ PO TBCR
40.0000 meq | EXTENDED_RELEASE_TABLET | ORAL | Status: AC
  Administered 2011-09-02 (×2): 40 meq via ORAL
  Filled 2011-09-02 (×2): qty 2

## 2011-09-02 MED ORDER — VITAMIN B-1 100 MG PO TABS: 100.0000 mg | ORAL_TABLET | Freq: Every day | ORAL | Status: DC

## 2011-09-02 MED ORDER — ENOXAPARIN SODIUM 40 MG/0.4ML ~~LOC~~ SOLN
40.0000 mg | SUBCUTANEOUS | Status: DC
  Administered 2011-09-02 – 2011-09-05 (×4): 40 mg via SUBCUTANEOUS
  Filled 2011-09-02 (×4): qty 0.4

## 2011-09-02 MED ORDER — ASPIRIN 81 MG PO CHEW
81.0000 mg | CHEWABLE_TABLET | Freq: Every day | ORAL | Status: DC
  Administered 2011-09-02 – 2011-09-05 (×4): 81 mg via ORAL
  Filled 2011-09-02 (×4): qty 1

## 2011-09-02 MED ORDER — IBUPROFEN 400 MG PO TABS
400.0000 mg | ORAL_TABLET | Freq: Once | ORAL | Status: AC
  Administered 2011-09-02: 400 mg via ORAL
  Filled 2011-09-02: qty 1

## 2011-09-02 MED ORDER — ADULT MULTIVITAMIN W/MINERALS CH
1.0000 | ORAL_TABLET | Freq: Every day | ORAL | Status: DC
  Administered 2011-09-02 – 2011-09-05 (×4): 1 via ORAL
  Filled 2011-09-02 (×4): qty 1

## 2011-09-02 MED ORDER — VITAMIN B-1 100 MG PO TABS
100.0000 mg | ORAL_TABLET | Freq: Every day | ORAL | Status: DC
  Administered 2011-09-02 – 2011-09-05 (×4): 100 mg via ORAL
  Filled 2011-09-02 (×4): qty 1

## 2011-09-02 MED ORDER — PANTOPRAZOLE SODIUM 40 MG PO TBEC
40.0000 mg | DELAYED_RELEASE_TABLET | Freq: Every day | ORAL | Status: DC
  Administered 2011-09-02 – 2011-09-04 (×3): 40 mg via ORAL
  Filled 2011-09-02 (×4): qty 1

## 2011-09-02 MED ORDER — MIRTAZAPINE 15 MG PO TBDP
15.0000 mg | ORAL_TABLET | Freq: Every evening | ORAL | Status: DC | PRN
  Administered 2011-09-02: 15 mg via ORAL
  Filled 2011-09-02: qty 1

## 2011-09-02 MED ORDER — K PHOS MONO-SOD PHOS DI & MONO 155-852-130 MG PO TABS
500.0000 mg | ORAL_TABLET | Freq: Three times a day (TID) | ORAL | Status: DC
  Administered 2011-09-02 – 2011-09-05 (×8): 500 mg via ORAL
  Filled 2011-09-02 (×11): qty 2

## 2011-09-02 MED ORDER — POTASSIUM CHLORIDE CRYS ER 20 MEQ PO TBCR
40.0000 meq | EXTENDED_RELEASE_TABLET | Freq: Two times a day (BID) | ORAL | Status: DC
  Filled 2011-09-02 (×2): qty 2

## 2011-09-02 MED ORDER — POTASSIUM CHLORIDE IN NACL 20-0.9 MEQ/L-% IV SOLN
INTRAVENOUS | Status: DC
  Administered 2011-09-02 – 2011-09-04 (×5): via INTRAVENOUS
  Filled 2011-09-02 (×6): qty 1000

## 2011-09-02 MED ORDER — FOLIC ACID 1 MG PO TABS
1.0000 mg | ORAL_TABLET | Freq: Every day | ORAL | Status: DC
  Administered 2011-09-02 – 2011-09-05 (×4): 1 mg via ORAL
  Filled 2011-09-02 (×4): qty 1

## 2011-09-02 MED ORDER — ADULT MULTIVITAMIN W/MINERALS CH
1.0000 | ORAL_TABLET | Freq: Every day | ORAL | Status: DC
  Filled 2011-09-02: qty 1

## 2011-09-02 MED ORDER — ACETAMINOPHEN 325 MG PO TABS
650.0000 mg | ORAL_TABLET | Freq: Four times a day (QID) | ORAL | Status: DC | PRN
  Administered 2011-09-02: 650 mg via ORAL
  Filled 2011-09-02: qty 2

## 2011-09-02 MED ORDER — ACAMPROSATE CALCIUM 333 MG PO TBEC
666.0000 mg | DELAYED_RELEASE_TABLET | Freq: Three times a day (TID) | ORAL | Status: DC
  Administered 2011-09-02 – 2011-09-05 (×9): 666 mg via ORAL
  Filled 2011-09-02 (×12): qty 2

## 2011-09-02 MED ORDER — DISULFIRAM 250 MG PO TABS
500.0000 mg | ORAL_TABLET | Freq: Every day | ORAL | Status: DC
  Administered 2011-09-03 – 2011-09-05 (×3): 500 mg via ORAL
  Filled 2011-09-02 (×6): qty 2

## 2011-09-02 MED ORDER — AMLODIPINE BESYLATE 5 MG PO TABS
5.0000 mg | ORAL_TABLET | Freq: Every day | ORAL | Status: DC
  Administered 2011-09-02 – 2011-09-05 (×4): 5 mg via ORAL
  Filled 2011-09-02 (×4): qty 1

## 2011-09-02 MED ORDER — METOPROLOL TARTRATE 12.5 MG HALF TABLET
12.5000 mg | ORAL_TABLET | Freq: Two times a day (BID) | ORAL | Status: DC
  Administered 2011-09-02 – 2011-09-05 (×7): 12.5 mg via ORAL
  Filled 2011-09-02 (×8): qty 1

## 2011-09-02 MED ORDER — DIAZEPAM 2 MG PO TABS
2.0000 mg | ORAL_TABLET | Freq: Four times a day (QID) | ORAL | Status: DC | PRN
  Filled 2011-09-02: qty 1

## 2011-09-02 MED ORDER — THIAMINE HCL 100 MG/ML IJ SOLN: 100.0000 mg | Freq: Every day | INTRAMUSCULAR | Status: DC

## 2011-09-02 MED ORDER — MAGNESIUM SULFATE 40 MG/ML IJ SOLN
2.0000 g | Freq: Once | INTRAMUSCULAR | Status: AC
  Administered 2011-09-02: 2 g via INTRAVENOUS
  Filled 2011-09-02: qty 50

## 2011-09-02 MED ORDER — ENSURE CLINICAL ST REVIGOR PO LIQD
237.0000 mL | Freq: Three times a day (TID) | ORAL | Status: DC
  Administered 2011-09-02 – 2011-09-05 (×8): 237 mL via ORAL

## 2011-09-02 MED ORDER — NAPHAZOLINE HCL 0.1 % OP SOLN
1.0000 [drp] | Freq: Four times a day (QID) | OPHTHALMIC | Status: DC | PRN
  Filled 2011-09-02: qty 15

## 2011-09-02 MED ORDER — OMEPRAZOLE MAGNESIUM 20 MG PO TBEC: 20.0000 mg | DELAYED_RELEASE_TABLET | Freq: Every day | ORAL | Status: DC

## 2011-09-02 NOTE — Progress Notes (Addendum)
PCP: Does not have one  Brief HPI: [name] with chronic alcohol abuse. He left [facility] after detox 5 days ago. He states he's been weak and had difficulty walking, which is new since he went to detox. He states prior to going to detox he was walking with no issues. He received Ativan while inpatient and states his symptoms developed after this medication was given. He states he had the same reaction to Ativan 20 years prior. Since being home patient continues to feel badly, yesterday he could not walk he was incontinent on himself. this recurred today. He came to the ER. Patient denies drinking alcohol since he left detox. In the ER he was clammy and tremulous and tachycardic, he was given Valium and his symptoms improved.    Past medical history:  Past Medical History   Diagnosis  Date   .  CAD (coronary artery disease)      Prior PCI to LAD in 1994   .  Alcohol abuse, in remission    .  Hyperlipidemia    .  HTN (hypertension)    .  Anxiety    .  Normal nuclear stress test  2010     EF 77%   .  Depression      Consultants: None  Procedures: None  Subjective: Patient complaining of weakness in legs. Denies any other complaints. His wife and Grandson at bedside. They provided some history.Wife concerned about Gout flare. Apparently supposed to see Neurologist for same problem.  Objective: Vital signs in last 24 hours: Temp:  [98.1 F (36.7 C)-102.6 F (39.2 C)] 98.6 F (37 C) (03/03 0525) Pulse Rate:  [77-117] 77  (03/03 0525) Resp:  [18-29] 20  (03/03 0525) BP: (121-170)/(63-120) 132/74 mmHg (03/03 0525) SpO2:  [92 %-97 %] 97 % (03/03 0525) Weight:  [88 kg (194 lb 0.1 oz)] 88 kg (194 lb 0.1 oz) (03/03 0300) Weight change:  Last BM Date: 09/01/11  Intake/Output from previous day: 03/02 0701 - 03/03 0700 In: 308.3 [I.V.:208.3; IV Piggyback:100] Out: 500 [Urine:500] Intake/Output this shift:    General appearance: alert, cooperative and no  distress Head: Normocephalic, without obvious abnormality, atraumatic Eyes: conjunctivae/corneas clear. PERRL, EOM's intact. Neck: no adenopathy, no carotid bruit, no JVD, supple, symmetrical, trachea midline and thyroid not enlarged, symmetric, no tenderness/mass/nodules Resp: clear to auscultation bilaterally Cardio: regular rate and rhythm, S1, S2 normal, no murmur, click, rub or gallop GI: soft, non-tender; bowel sounds normal; no masses,  no organomegaly Extremities: extremities normal, atraumatic, no cyanosis or edema Pulses: 2+ and symmetric Skin: Skin color, texture, turgor normal. No rashes or lesions Lymph nodes: Cervical, supraclavicular, and axillary nodes normal. Neurologic: Alert and oriented X 3, normal strength and tone. No cranial nerve deficits.  Lab Results:  Basename 09/02/11 0540 09/01/11 2015  WBC 4.9 8.2  HGB 9.7* 11.3*  HCT 27.3* 31.5*  PLT 225 242   BMET  Basename 09/02/11 0540 09/01/11 2015  NA 137 132*  K 2.7* 2.5*  CL 105 97  CO2 23 22  GLUCOSE 95 123*  BUN 7 6  CREATININE 0.91 0.99  CALCIUM 6.9* 7.8*  ALT -- --    Studies/Results: Dg Chest Port 1 View  09/01/2011  *RADIOLOGY REPORT*  Clinical Data: Fevers  PORTABLE CHEST - 1 VIEW  Comparison: 11/04/2010  Findings: The heart size and mediastinal contours are within normal limits.  Both lungs are clear.  The visualized skeletal structures are unremarkable.  IMPRESSION: Negative exam.  Original Report Authenticated By: Rosealee Albee, M.D.    Medications:  Scheduled:    . acamprosate  666 mg Oral TID WC  . acetaminophen  650 mg Oral Once  . amLODipine  5 mg Oral Daily  . aspirin  81 mg Oral Daily  . diazepam  5 mg Intravenous Once  . disulfiram  500 mg Oral Daily  . enoxaparin  40 mg Subcutaneous Q24H  . folic acid  1 mg Oral Daily  . magnesium sulfate 1 - 4 g bolus IVPB  2 g Intravenous Once  . metoprolol tartrate  12.5 mg Oral BID  . mulitivitamin with minerals  1 tablet Oral Daily   . pantoprazole  40 mg Oral Q1200  . potassium chloride  10 mEq Intravenous Once  . potassium chloride  40 mEq Oral Q4H  . sodium chloride  1,000 mL Intravenous Once  . thiamine  100 mg Oral Daily  . DISCONTD: mulitivitamin with minerals  1 tablet Oral Daily  . DISCONTD: omeprazole  20 mg Oral Daily  . DISCONTD: potassium chloride  40 mEq Oral BID  . DISCONTD: thiamine  100 mg Intravenous Daily  . DISCONTD: thiamine  100 mg Oral Daily    Assessment/Plan:  Principal Problem:  *Generalized weakness Active Problems:  CAD (coronary artery disease)  Hyperlipidemia  HTN (hypertension)  Chronic alcoholism  Anxiety and depression  Hypokalemia  Hypomagnesemia   Generalized Weakness: Multifactorial Probably from poor nutritional status and electrolyte abnormalities due to alcohol abuse. PT/OT eval. Check albumin.  Hypokalemia and Hypomagnesemia: Replete aggressively.  Alcoholism: Discharged from [facility] after detox on 2/23. Stable. No evidence for acute withdrawal. Continue Thiamine and multivitamins. Patient states he has not had a drink in 2 weeks. Currently patient's alcohol level is normal.   CAD (coronary artery disease): Stable  Anxiety and depression: Monitor closely.  HTN (hypertension): Continue home meds.  H/O Hyperlipidemia   Concern about Gout: Check Uric acid. Consider restarting Allopurinol    LOS: 1 day   [facility] Pager [phone] 09/02/2011, 12:20 PM   Patient developed a fever of 102.9. No symptoms. No clear source for fever. Will check Influenza. Blood cultures already pending. Give Motrin and tylenol. Continue to monitor. No specific indication for initiating antibiotics.  Tanishi Nault 09/02/2011 5:14 PM

## 2011-09-02 NOTE — Progress Notes (Signed)
Upon admission patient was placed on telemetry and appear to be paced on the monitor. Patient denied having a pace maker and nothing in his history indicated such. [address] Press photographer and rapid response nurse were both consulted to determine what was going on. The patient was asymptomatic and was assessed a second time by rapid response nurse for physical indication of pacer. Nothing supported what was seen on the monitor. Patient was change to a new telemetry box and began to read normal sinus rhythm. Issue resolved.

## 2011-09-02 NOTE — Discharge Summary (Signed)
Physician Discharge Summary Note  Patient:  Dennis York is an 71 y.o., male MRN:  [id number] DOB:  [birth year] Patient phone:  [phone] (home)  Patient address:   [address] [place] [address],   Date of Admission:  08/25/2011 Date of Discharge: 08/30/2011  Reason for Admission: Says the he drinks to combat his fears. Wife of 30 years left him a week ago and said he had to get help and be sober before she would reconsider coming home. His last attempt at sobriety was last March was here.Then wentt o [facility] a 28 day detox but stayed sober less than a month.  Today he is convinced that he self medicates with alcohol.Has been drinking 4-8 12 oz beers a day. Denies withdrawal seizures DT's or blackouts. Does have mild hand tremors.   Discharge Diagnoses: Principal Problem:  *Chronic alcoholism Active Problems:  Anxiety and depression   Axis Diagnosis:   AXIS I:  Alcohol Abuse and Substance Induced Mood Disorder AXIS II:  Personality Disorder NOS AXIS III:   Past Medical History  Diagnosis Date  . CAD (coronary artery disease)     Prior PCI to LAD in 1994  . Alcohol abuse, in remission   . Hyperlipidemia   . HTN (hypertension)   . Anxiety   . Normal nuclear stress test 2010    EF 77%  . Depression    AXIS IV:  other psychosocial or environmental problems and problems related to social environment AXIS V:  41-50 serious symptoms  Level of Care:  OP  Hospital Course:   Pt was admitted for stabilization.  He insisted that his ability to walk was changed after he got the shot of Ativan in the ED.  A Neurology consult was obtained and recommended an MRI.  Pt refused to submit to that without a second opinion.  Pt was informed that his liver enzymes were 5 times what was upper limits of normal. He asked if that was proof that he had cirrhosis of the liver.  He was informed that it was indicative of liver damage from alcohol use.  He was placed on a Librium detox and he  did well with that.  His family was very insistent that he re enter an alcohol treatment center.  He completely forgot his original concerns leading up to coming into the hospital and insisted that if his wife was moving out because of his alcohol use, then he didn't have a wife anymore. He attend very few if any groups and claimed that his not being able to walk was his big problem and he didn't need to be in a psychiatric hospital.  His basic response to any challenge to his lifestyle of 4 to 6 beers a day was one of oppositional insistence that his drinking was a problem for every body else and was not a problem at all for him.  He insisted that without cirrhosis of the liver he did not have a drinking problem.  He refused the Antabuse prescribed for him by his primary prescriber.  He did ask, if he did agree to take Antabuse how long after he took the Antabuse could he "experiment" with the use of alcohol again.  Consults:  neurology  Significant Diagnostic Studies:  None  Discharge Vitals:   Blood pressure 147/85, pulse 93, temperature 97.8 F (36.6 C), temperature source Oral, resp. rate 20, height 5' 8.5" (1.74 m), weight 85.276 kg (188 lb), SpO2 95.00%.  Mental Status Exam: See Mental  Status Examination and Suicide Risk Assessment completed by Attending Physician prior to discharge.  Discharge destination:  Home  Is patient on multiple antipsychotic therapies at discharge:  No   Has Patient had three or more failed trials of antipsychotic monotherapy by history:  No  Recommended Plan for Multiple Antipsychotic Therapies: There are no recommendations for managing multiple antipsychotics because this patient is not on multiple antipsychotics.   Medication List  As of 09/02/2011 11:39 AM   TAKE these medications      Indication    acamprosate 333 MG tablet   Commonly known as: CAMPRAL   Take 2 tablets (666 mg total) by mouth 3 (three) times daily with meals. To reduce cravings for  alcohol       amLODipine 5 MG tablet   Commonly known as: NORVASC   Take 1 tablet (5 mg total) by mouth daily. For control of high blood pressure       aspirin 81 MG tablet   Take 1 tablet (81 mg total) by mouth daily. For prevention of stroke and heart attack       disulfiram 250 MG tablet   Commonly known as: ANTABUSE   Take 2 tablets (500 mg total) by mouth daily. To deter one from continuing use of alcohol       metoprolol tartrate 25 MG tablet   Commonly known as: LOPRESSOR   Take 0.5 tablets (12.5 mg total) by mouth 2 (two) times daily. For control of high blood pressure       mirtazapine 15 MG disintegrating tablet   Commonly known as: REMERON SOL-TAB   Take 1 tablet (15 mg total) by mouth at bedtime as needed (As needed for sleep). For insomnia       mulitivitamin with minerals Tabs   Take 1 tablet by mouth daily. For nutritional supplementation.       omeprazole 20 MG tablet   Commonly known as: PRILOSEC OTC   Take 1 tablet (20 mg total) by mouth daily. For control of stomach acid secretion and helps GERD.       tetrahydrozoline 0.05 % ophthalmic solution   Place 1 drop into both eyes daily. For reduction of eye redness       thiamine 100 MG tablet   Take 1 tablet (100 mg total) by mouth daily. For nutritional supplementation.            Follow-up Information    Follow up with [name]. (You are schduled to see [name] on Monday, September 10, 2011 at 2:00p.m.)    Contact information:   [address] [place], [place]  [address]  [phone]         Follow-up recommendations:  Other:  Consider stopping drinking as it will eventually kill him on top of destroying all of his relationships.  Comments:    SignedDan Humphreys, Mitsuye Schrodt 09/02/2011, 11:39 AM

## 2011-09-02 NOTE — H&P (Signed)
PCP:   Unknown   Chief Complaint:  Weakness  HPI: [name] with chronic alcohol abuse. He left AMA from a detox facility 5 days ago. He states he's been weak and had difficulty walking, which is new since he went to detox. He states prior to going to detox he was walking with no issues. He received Ativan while inpatient and states his symptoms developed after this medication was given. He states he had the same reaction to Ativan 20 years prior. Since being home patient continues to feel badly, yesterday he could not walk he was incontinent on himself. this recurred today. He came to the ER. Patient denies drinking alcohol since he left detox. In the ER he wished clammy and tremulous and tachycardic, he was given Valium and his symptoms improved.   Review of Systems: Positives bolded   anorexia, fever, weight loss,, vision loss, decreased hearing, hoarseness, chest pain, syncope, dyspnea on exertion, peripheral edema, balance deficits, hemoptysis, abdominal pain, melena, hematochezia, severe indigestion/heartburn, hematuria, incontinence, genital sores, muscle weakness, suspicious skin lesions, transient blindness, difficulty walking, depression, unusual weight change, abnormal bleeding, enlarged lymph nodes, angioedema, and breast masses.  Past Medical History: Past Medical History  Diagnosis Date  . CAD (coronary artery disease)     Prior PCI to LAD in 1994  . Alcohol abuse, in remission   . Hyperlipidemia   . HTN (hypertension)   . Anxiety   . Normal nuclear stress test 2010    EF 77%  . Depression    Past Surgical History  Procedure Date  . Replacement total knee Feb 2010  . Cardiac catheterization 01/30/1993    EF 55-60%  . US echocardiography 09/04/2005    EF 55-60%  . Cardiovascular stress test 09/12/2010    EF 68%  . Joint replacement     Medications: Prior to Admission medications   Medication Sig Start Date End Date Taking? Authorizing Provider    acamprosate (CAMPRAL) 333 MG tablet Take 2 tablets (666 mg total) by mouth 3 (three) times daily with meals. To reduce cravings for alcohol 08/30/11 09/09/11  Orson Aloe, MD  amLODipine (NORVASC) 5 MG tablet Take 1 tablet (5 mg total) by mouth daily. For control of high blood pressure 08/30/11   Orson Aloe, MD  aspirin 81 MG tablet Take 1 tablet (81 mg total) by mouth daily. For prevention of stroke and heart attack 08/30/11   Orson Aloe, MD  disulfiram (ANTABUSE) 250 MG tablet Take 2 tablets (500 mg total) by mouth daily. To deter one from continuing use of alcohol 08/30/11 08/29/12  Orson Aloe, MD  metoprolol tartrate (LOPRESSOR) 25 MG tablet Take 0.5 tablets (12.5 mg total) by mouth 2 (two) times daily. For control of high blood pressure 08/30/11   Orson Aloe, MD  mirtazapine (REMERON SOL-TAB) 15 MG disintegrating tablet Take 1 tablet (15 mg total) by mouth at bedtime as needed (As needed for sleep). For insomnia 08/30/11 09/29/11  Orson Aloe, MD  Multiple Vitamin (MULITIVITAMIN WITH MINERALS) TABS Take 1 tablet by mouth daily. For nutritional supplementation. 08/30/11   Orson Aloe, MD  omeprazole (PRILOSEC OTC) 20 MG tablet Take 1 tablet (20 mg total) by mouth daily. For control of stomach acid secretion and helps GERD. 08/30/11   Orson Aloe, MD  tetrahydrozoline 0.05 % ophthalmic solution Place 1 drop into both eyes daily. For reduction of eye redness 08/30/11   Orson Aloe, MD  thiamine 100 MG tablet Take 1 tablet (100 mg total)  by mouth daily. For nutritional supplementation. 08/30/11 08/29/12  Orson Aloe, MD    Allergies:   Allergies  Allergen Reactions  . Ativan (ZOX:WRUEAV Alcohol+Lorazepam+Polyethylene Glycol+Propylene Glycol)     ATAXIA LASTING FOR SEVERAL DAYS  . Barbiturates     REACTION: Reaction not known  . Penicillins     REACTION: Reaction not known    Social History:  reports that he quit smoking about 16 years ago. He does not have any smokeless tobacco history on  file. He reports that he drinks alcohol. He reports that he does not use illicit drugs.  Family History: Family History  Problem Relation Age of Onset  . Hypertension    . Depression      Physical Exam: Filed Vitals:   09/01/11 2130 09/01/11 2145 09/01/11 2200 09/01/11 2333  BP:  126/63  129/65  Pulse:    90  Temp:    100.7 F (38.2 C)  TempSrc:    Oral  Resp: 25 29 28 18   SpO2:    95%    General:  Alert and oriented times three, disheveled gentleman, no acute distress Eyes: PERRLA, pink conjunctiva, no scleral icterus ENT: Moist oral mucosa, neck supple, no thyromegaly Lungs: clear to ascultation, no wheeze, no crackles, no use of accessory muscles Cardiovascular: regular rate and rhythm, no regurgitation, no gallops, no murmurs. No carotid bruits, no JVD Abdomen: soft, positive BS, non-tender, non-distended, no organomegaly, not an acute abdomen GU: not examined Neuro: CN II - XII grossly intact, sensation intact Musculoskeletal: strength 4.5/5 bilateral lower extremities, no clubbing, cyanosis or edema Skin: no rash, no subcutaneous crepitation, no decubitus Psych: appropriate patient   Labs on Admission:   Basename 09/01/11 2015 08/30/11 0630  NA 132* 137  K 2.5* 3.2*  CL 97 101  CO2 22 25  GLUCOSE 123* 92  BUN 6 12  CREATININE 0.99 0.92  CALCIUM 7.8* 7.9*  MG -- --  PHOS -- --    Basename 08/30/11 0630  AST 48*  ALT 30  ALKPHOS 94  BILITOT 1.0  PROT 6.8  ALBUMIN 2.8*   No results found for this basename: LIPASE:2,AMYLASE:2 in the last 72 hours  Basename 09/01/11 2015  WBC 8.2  NEUTROABS 4.1  HGB 11.3*  HCT 31.5*  MCV 99.4  PLT 242   No results found for this basename: CKTOTAL:3,CKMB:3,CKMBINDEX:3,TROPONINI:3 in the last 72 hours No components found with this basename: POCBNP:3 No results found for this basename: DDIMER:2 in the last 72 hours  Basename 08/30/11 0630  HGBA1C 5.6   No results found for this basename:  CHOL:2,HDL:2,LDLCALC:2,TRIG:2,CHOLHDL:2,LDLDIRECT:2 in the last 72 hours  Basename 08/30/11 0630  TSH 3.771  T4TOTAL --  T3FREE 2.4  THYROIDAB --   No results found for this basename: VITAMINB12:2,FOLATE:2,FERRITIN:2,TIBC:2,IRON:2,RETICCTPCT:2 in the last 72 hours  Micro Results: No results found for this or any previous visit (from the past 240 hour(s)). Results for PHILIP, KOTLYAR (MRN [id number]) as of 09/02/2011 00:37  Ref. Range 09/01/2011 20:30  Color, Urine Latest Range: YELLOW  YELLOW  APPearance Latest Range: CLEAR  CLEAR  Specific Gravity, Urine Latest Range: 1.005-1.030  1.012  pH Latest Range: 5.0-8.0  7.0  Glucose, UA Latest Range: NEGATIVE mg/dL NEGATIVE  Bilirubin Urine Latest Range: NEGATIVE  NEGATIVE  Ketones, ur Latest Range: NEGATIVE mg/dL NEGATIVE  Protein Latest Range: NEGATIVE mg/dL 30 (A)  Urobilinogen, UA Latest Range: 0.0-1.0 mg/dL 2.0 (H)  Nitrite Latest Range: NEGATIVE  NEGATIVE  Leukocytes, UA Latest Range: NEGATIVE  NEGATIVE  Hgb urine dipstick Latest Range: NEGATIVE  SMALL (A)  RBC / HPF Latest Range: <3 RBC/hpf 0-2    Radiological Exams on Admission: Dg Chest Port 1 View  09/01/2011  *RADIOLOGY REPORT*  Clinical Data: Fevers  PORTABLE CHEST - 1 VIEW  Comparison: 11/04/2010  Findings: The heart size and mediastinal contours are within normal limits.  Both lungs are clear.  The visualized skeletal structures are unremarkable.  IMPRESSION: Negative exam.  Original Report Authenticated By: Rosealee Albee, M.D.    Assessment/Plan Present on Admission:  .Hypokalemia Admit to telemetry Will replete with by mouth and IV potassium. Check magnesium level Likely due to poor nutritional intake Weakness  Likely multifactorial including chronic alcohol abuse, question alcohol withdrawal, hypokalemia, check for hypomagnesemia Will consult physical therapy Question of alcohol withdrawal Patient states he has not drank in 2 weeks, however, he signed himself out  AMA from rehabilitation to 5 days ago. Currently patient's alcohol level is normal. Will order CIWA protocol, given his withdrawal symptoms at presentation to the ER Will give patient Valium instead of Ativan. Marland KitchenCAD (coronary artery disease) .Anxiety and depression .Chronic alcoholism .HTN (hypertension) .Hyperlipidemia Stable resume medications   Full code DVT prophylaxis Team 2/Dr. Truddie Coco, Heinrich Fertig 09/02/2011, 12:28 AM

## 2011-09-02 NOTE — Progress Notes (Signed)
Paged MD at approximately 0805 to make him aware of pt's potassium level of 2.7.Marland KitchenMarland Kitchen

## 2011-09-02 NOTE — ED Notes (Signed)
Report given to [place], [place]

## 2011-09-02 NOTE — Progress Notes (Signed)
I called pharmacy to ask about pt's antabuse and they said that it had to come from [facility] and that it would be in around 3pm. I called back at 1600 and they said that [facility] did not send it.

## 2011-09-02 NOTE — ED Notes (Signed)
Patient is resting comfortably. 

## 2011-09-03 DIAGNOSIS — M109 Gout, unspecified: Secondary | ICD-10-CM | POA: Diagnosis not present

## 2011-09-03 DIAGNOSIS — R509 Fever, unspecified: Secondary | ICD-10-CM | POA: Diagnosis not present

## 2011-09-03 DIAGNOSIS — R5381 Other malaise: Secondary | ICD-10-CM | POA: Diagnosis not present

## 2011-09-03 DIAGNOSIS — I10 Essential (primary) hypertension: Secondary | ICD-10-CM | POA: Diagnosis not present

## 2011-09-03 LAB — CBC
Hemoglobin: 10.1 g/dL — ABNORMAL LOW (ref 13.0–17.0)
MCH: 35.8 pg — ABNORMAL HIGH (ref 26.0–34.0)
MCHC: 35.1 g/dL (ref 30.0–36.0)
MCV: 102.1 fL — ABNORMAL HIGH (ref 78.0–100.0)

## 2011-09-03 LAB — COMPREHENSIVE METABOLIC PANEL
ALT: 41 U/L (ref 0–53)
Calcium: 7.7 mg/dL — ABNORMAL LOW (ref 8.4–10.5)
Creatinine, Ser: 0.87 mg/dL (ref 0.50–1.35)
GFR calc Af Amer: >90 mL/min (ref >90)
GFR calc non Af Amer: 84 mL/min — ABNORMAL LOW (ref >90)
Glucose, Bld: 100 mg/dL — ABNORMAL HIGH (ref 70–99)
Sodium: 137 mEq/L (ref 135–145)
Total Protein: 6.2 g/dL (ref 6.0–8.3)

## 2011-09-03 LAB — INFLUENZA PANEL BY PCR (TYPE A & B)
H1N1 flu by pcr: NOT DETECTED
Influenza B By PCR: NEGATIVE

## 2011-09-03 LAB — URINE CULTURE
Colony Count: NO GROWTH
Culture  Setup Time: 201303030449

## 2011-09-03 LAB — VITAMIN B12: Vitamin B-12: 635 pg/mL (ref 211–911)

## 2011-09-03 MED ORDER — COLCHICINE 0.6 MG PO TABS
0.6000 mg | ORAL_TABLET | Freq: Two times a day (BID) | ORAL | Status: DC
  Administered 2011-09-03 – 2011-09-04 (×3): 0.6 mg via ORAL
  Filled 2011-09-03 (×4): qty 1

## 2011-09-03 MED ORDER — POTASSIUM CHLORIDE CRYS ER 20 MEQ PO TBCR
20.0000 meq | EXTENDED_RELEASE_TABLET | Freq: Once | ORAL | Status: AC
  Administered 2011-09-03: 20 meq via ORAL
  Filled 2011-09-03: qty 1

## 2011-09-03 MED ORDER — MAGNESIUM OXIDE 400 MG PO TABS
400.0000 mg | ORAL_TABLET | Freq: Two times a day (BID) | ORAL | Status: DC
  Administered 2011-09-03 – 2011-09-05 (×5): 400 mg via ORAL
  Filled 2011-09-03 (×6): qty 1

## 2011-09-03 MED ORDER — IBUPROFEN 400 MG PO TABS
400.0000 mg | ORAL_TABLET | Freq: Three times a day (TID) | ORAL | Status: DC
  Administered 2011-09-03 – 2011-09-04 (×3): 400 mg via ORAL
  Filled 2011-09-03 (×5): qty 1

## 2011-09-03 NOTE — Evaluation (Addendum)
Physical Therapy Evaluation Patient Details Name: Dennis York MRN: [id number] DOB: [birth year] Today's Date: 09/03/2011  Problem List:  Patient Active Problem List  Diagnoses  . CAD (coronary artery disease)  . Hyperlipidemia  . HTN (hypertension)  . Chronic alcoholism  . Anxiety and depression  . Hypokalemia  . Generalized weakness  . Hypomagnesemia    Past Medical History:  Past Medical History  Diagnosis Date  . CAD (coronary artery disease)     Prior PCI to LAD in 1994  . Alcohol abuse, in remission   . Hyperlipidemia   . HTN (hypertension)   . Anxiety   . Normal nuclear stress test 2010    EF 77%  . Depression    Past Surgical History:  Past Surgical History  Procedure Date  . Replacement total knee Feb 2010  . Cardiac catheterization 01/30/1993    EF 55-60%  . US echocardiography 09/04/2005    EF 55-60%  . Cardiovascular stress test 09/12/2010    EF 68%  . Joint replacement     PT Assessment/Plan/Recommendation PT Assessment Clinical Impression Statement: Pt presents with generalized weakness with history of ETOH abuse.  Pt tolerated ambulation, however PT noted pt to be very unstable with shuffling gait pattern and constant cuing required for proper use of RW.  May use L platform RW in future due to gout flare up in L wrist causing pain.  PT recommends short term SNF stay to increase pt safety and decrease burden of care.  Pt hesitant about short term SNF stay, however explained that pt is a high fall risk at this time and would benefit from further therapy.  Will continue to assess D/C plans for SNF vs. HHPT pending pt progress.   PT Recommendation/Assessment: Patient will need skilled PT in the acute care venue PT Problem List: Decreased strength;Decreased activity tolerance;Decreased balance;Decreased mobility;Decreased coordination;Decreased safety awareness;Decreased knowledge of use of DME;Pain Barriers to Discharge: None Barriers to Discharge Comments: States  wife is at home, however she may leave from time to time for errands.  PT Therapy Diagnosis : Difficulty walking;Abnormality of gait;Generalized weakness;Acute pain PT Plan PT Frequency: Min 3X/week PT Treatment/Interventions: DME instruction;Gait training;Functional mobility training;Therapeutic activities;Therapeutic exercise;Patient/family education PT Recommendation Follow Up Recommendations: Home health PT;Skilled nursing facility;Supervision for mobility/OOB Equipment Recommended: Defer to next venue PT Goals  Acute Rehab PT Goals PT Goal Formulation: With patient Time For Goal Achievement: 2 weeks Pt will go Supine/Side to Sit: with modified independence PT Goal: Supine/Side to Sit - Progress: Goal set today Pt will go Sit to Supine/Side: with modified independence PT Goal: Sit to Supine/Side - Progress: Goal set today Pt will go Sit to Stand: with supervision PT Goal: Sit to Stand - Progress: Goal set today Pt will go Stand to Sit: with supervision PT Goal: Stand to Sit - Progress: Goal set today Pt will Ambulate: >150 feet;with supervision;with least restrictive assistive device PT Goal: Ambulate - Progress: Goal set today Pt will Perform Home Exercise Program: with supervision, verbal cues required/provided PT Goal: Perform Home Exercise Program - Progress: Goal set today  PT Evaluation Precautions/Restrictions  Precautions Precautions: Fall Prior Functioning  Home Living Lives With: Spouse Receives Help From: Family Type of Home: House Home Layout: One level Home Access: Level entry Home Adaptive Equipment: Walker - rolling;Straight cane Prior Function Level of Independence: Independent with basic ADLs;Independent with gait;Independent with transfers Driving: Yes Vocation: Retired Financial risk analyst Arousal/Alertness: Awake/alert Overall Cognitive Status: Appears within functional limits for tasks assessed Sensation/Coordination Sensation  Light Touch:  Appears Intact Coordination Gross Motor Movements are Fluid and Coordinated: No Coordination and Movement Description: Pt noted to have shuffling gait pattern.  Extremity Assessment RUE Assessment RLE Assessment RLE Assessment: Exceptions to [facility] RLE Strength RLE Overall Strength Comments: Ankle DF/PF 4/5, knee flex/ext, hip flex grossly WFL per functional assessment LLE Assessment LLE Assessment: Exceptions to [facility] LLE Strength LLE Overall Strength Comments: Ankle DF/PF 4/5, knee flex/ext, hip flex grossly WFL per functional assessment.  Mobility (including Balance) Bed Mobility Bed Mobility: Yes Supine to Sit: 5: Supervision Supine to Sit Details (indicate cue type and reason): Supervision for safety.  cues for hand placement to self assist with better technique.  Transfers Transfers: Yes Sit to Stand: 4: Min assist;From chair/3-in-1;With upper extremity assist Sit to Stand Details (indicate cue type and reason): min verbal cues to scoot to EOC and hand placement.  Stand to Sit: 4: Min assist;With upper extremity assist;To chair/3-in-1 Stand to Sit Details: with min verbal cues for safety and hand placement. Ambulation/Gait Ambulation/Gait: Yes Ambulation/Gait Assistance: 1: +2 Total assist;Patient percentage (comment) Ambulation/Gait Assistance Details (indicate cue type and reason): Pt assist 70%.  Requires max cuing for sequencing/technique with RW due to pt tendency to push RW too far ahead with forward flexed posture.  Cues for increased step length bilaterally.  Ambulation Distance (Feet): 150 Feet Assistive device: Rolling walker Gait Pattern: Step-through pattern;Decreased stride length;Trunk flexed;Shuffle Gait velocity: decreased, however increases when RW gets too far ahead of pt.     Exercise  General Exercises - Lower Extremity Ankle Circles/Pumps: AROM;Both;10 reps;Seated Short Arc Quad: Strengthening;Both;5 reps;Seated Heel Slides: Strengthening;Both;5  reps;Seated Hip ABduction/ADduction: Strengthening;Both;5 reps;Seated Straight Leg Raises: Strengthening;Both;5 reps;Seated End of Session PT - End of Session Equipment Utilized During Treatment: Gait belt Activity Tolerance: Patient limited by pain;Patient limited by fatigue Patient left: in chair;with call bell in reach Nurse Communication: Mobility status for transfers;Mobility status for ambulation General Behavior During Session: [facility] for tasks performed Cognition: [facility] for tasks performed  Page, Meribeth Mattes 09/03/2011, 12:17 PM

## 2011-09-03 NOTE — Progress Notes (Signed)
Patient Discharge Instructions:  Psychiatric Admission Assessment Note Faxed,  09/03/2011 Discharge Summary Note Faxed,   09/03/2011 After Visit Summary (AVS) Faxed,  09/03/2011 Face Sheet Faxed, 09/03/2011 Faxed to the Next Level Care provider:  09/03/2011  Faxed to [facility] @ [phone]  Antanette Richwine, Eduard Clos, 09/03/2011, 4:50 PM

## 2011-09-03 NOTE — Evaluation (Addendum)
Occupational Therapy Evaluation Patient Details Name: Dennis York MRN: [id number] DOB: [birth year] Today's Date: 09/03/2011  Problem List:  Patient Active Problem List  Diagnoses  . CAD (coronary artery disease)  . Hyperlipidemia  . HTN (hypertension)  . Chronic alcoholism  . Anxiety and depression  . Hypokalemia  . Generalized weakness  . Hypomagnesemia    Past Medical History:  Past Medical History  Diagnosis Date  . CAD (coronary artery disease)     Prior PCI to LAD in 1994  . Alcohol abuse, in remission   . Hyperlipidemia   . HTN (hypertension)   . Anxiety   . Normal nuclear stress test 2010    EF 77%  . Depression    Past Surgical History:  Past Surgical History  Procedure Date  . Replacement total knee Feb 2010  . Cardiac catheterization 01/30/1993    EF 55-60%  . US echocardiography 09/04/2005    EF 55-60%  . Cardiovascular stress test 09/12/2010    EF 68%  . Joint replacement     OT Assessment/Plan/Recommendation OT Assessment Clinical Impression Statement: Pt will benefit from skilled OT services to increase his safety and independence with ADL tasks for next venue of care. OT Recommendation/Assessment: Patient will need skilled OT in the acute care venue OT Problem List: Decreased strength;Decreased range of motion;Impaired balance (sitting and/or standing);Decreased knowledge of use of DME or AE;Pain OT Therapy Diagnosis : Generalized weakness;Acute pain OT Plan OT Frequency: Min 1X/week OT Treatment/Interventions: Self-care/ADL training;Therapeutic activities;DME and/or AE instruction;Patient/family education OT Recommendation Follow Up Recommendations: Skilled nursing facility Equipment Recommended: Defer to next venue Individuals Consulted Consulted and Agree with Results and Recommendations: Patient;Family member/caregiver Family Member Consulted: spouse OT Goals Acute Rehab OT Goals OT Goal Formulation: With patient/family Time For Goal  Achievement: 2 weeks ADL Goals Pt Will Perform Grooming: with supervision;Standing at sink ADL Goal: Grooming - Progress: Goal set today Pt Will Perform Upper Body Bathing: with set-up;Sitting, chair;Sitting, edge of bed ADL Goal: Upper Body Bathing - Progress: Goal set today Pt Will Perform Lower Body Bathing: with supervision;Sit to stand from chair;Sit to stand from bed ADL Goal: Lower Body Bathing - Progress: Goal set today Pt Will Perform Upper Body Dressing: with set-up;Sitting, chair;Sitting, bed ADL Goal: Upper Body Dressing - Progress: Goal set today Pt Will Perform Lower Body Dressing: with supervision;Sit to stand from bed;Sit to stand from chair ADL Goal: Lower Body Dressing - Progress: Goal set today Pt Will Transfer to Toilet: with supervision;Ambulation;with DME;3-in-1 ADL Goal: Toilet Transfer - Progress: Goal set today Pt Will Perform Toileting - Clothing Manipulation: with supervision;Standing ADL Goal: Toileting - Clothing Manipulation - Progress: Goal set today Pt Will Perform Toileting - Hygiene: with supervision;Sit to stand from 3-in-1/toilet ADL Goal: Toileting - Hygiene - Progress: Goal set today  OT Evaluation Precautions/Restrictions  Precautions Precautions: Fall Restrictions Weight Bearing Restrictions: No Prior Functioning Home Living Lives With: Spouse Receives Help From: Family Type of Home: House Home Layout: One level Home Access: Level entry Home Adaptive Equipment: Walker - rolling;Straight cane Prior Function Level of Independence: Independent with basic ADLs;Independent with gait;Independent with transfers Driving: Yes Vocation: Retired ADL ADL Eating/Feeding: Simulated;Set up Where Assessed - Eating/Feeding: Chair Grooming: Simulated;Wash/dry face;Minimal assistance Grooming Details (indicate cue type and reason): pt with painful left wrist and is left handed . Needed assist to manipulate soap initially with washcloth.  Where Assessed  - Grooming: Sitting, chair Upper Body Bathing: Chest;Right arm;Left arm;Abdomen;Minimal assistance;Performed Upper Body Bathing Details (indicate cue type  and reason): for thoroughness to clean under right UE. Able to do some but diffcult to rub hard enough to clean well due to left wrist pain.  Where Assessed - Upper Body Bathing: Sitting, chair;Unsupported Lower Body Bathing: Performed;Minimal assistance;Moderate assistance Lower Body Bathing Details (indicate cue type and reason): assist with balance and to thoroughly wash posterior periarea Where Assessed - Lower Body Bathing: Sit to stand from chair Upper Body Dressing: Simulated;Minimal assistance Where Assessed - Upper Body Dressing: Sitting, chair;Unsupported Lower Body Dressing: Simulated;Moderate assistance Lower Body Dressing Details (indicate cue type and reason): for balance and helping pull up clothing due to painful left wrist Where Assessed - Lower Body Dressing: Sit to stand from chair Toilet Transfer: Simulated;Minimal assistance Toilet Transfer Details (indicate cue type and reason): min assist sit to stand aspect only during bath. Toileting - Clothing Manipulation: Simulated;Moderate assistance Where Assessed - Toileting Clothing Manipulation: Standing Toileting - Hygiene: Simulated;Moderate assistance Where Assessed - Toileting Hygiene: Standing Tub/Shower Transfer: Not assessed Tub/Shower Transfer Method: Not assessed Equipment Used: Rolling walker ADL Comments: wife present and states she and pt are agreeable to SNF at discharge to increase safety and independence with ADL. Reports patient had some falls while home as he was getting weak.  Vision/Perception    Cognition Cognition Arousal/Alertness: Awake/alert Overall Cognitive Status: Appears within functional limits for tasks assessed Sensation/Coordination Sensation Light Touch: Appears Intact Coordination Gross Motor Movements are Fluid and Coordinated:  No Coordination and Movement Description: Pt noted to have shuffling gait pattern.  Extremity Assessment RUE Assessment RUE Assessment: Within Functional Limits LUE Assessment LUE Assessment: Exceptions to [facility] LUE Strength LUE Overall Strength Comments: grossly WFL except at wrist. Pt reports gout in left wrist and very painful with any movement/use. Able to lightly hold and squeeze washcloth but difficult/painful.  Mobility  Bed Mobility Bed Mobility: Yes Supine to Sit: 5: Supervision Supine to Sit Details (indicate cue type and reason): Supervision for safety.  cues for hand placement to self assist with better technique.  Transfers Sit to Stand: 4: Min assist;From chair/3-in-1;With upper extremity assist Sit to Stand Details (indicate cue type and reason): min verbal cues to scoot to EOC and hand placement.  Stand to Sit: 4: Min assist;With upper extremity assist;To chair/3-in-1 Stand to Sit Details: with min verbal cues for safety and hand placement. End of Session OT - End of Session Activity Tolerance: Patient tolerated treatment well Patient left: in chair;with call bell in reach;with family/visitor present General Behavior During Session: [facility] for tasks performed Cognition: [facility] for tasks performed   Lennox Laity [phone] 09/03/2011, 12:13 PM

## 2011-09-03 NOTE — Progress Notes (Signed)
UR completed 

## 2011-09-03 NOTE — Progress Notes (Signed)
PCP: Does not have one  Brief HPI: [name] with chronic alcohol abuse. He left [facility] after detox 5 days ago. He states he's been weak and had difficulty walking, which is new since he went to detox. He states prior to going to detox he was walking with no issues. He received Ativan while inpatient and states his symptoms developed after this medication was given. He states he had the same reaction to Ativan 20 years prior. Since being home patient continues to feel badly, yesterday he could not walk he was incontinent on himself. this recurred today. He came to the ER. Patient denies drinking alcohol since he left detox. In the ER he was clammy and tremulous and tachycardic, he was given Valium and his symptoms improved.    Past medical history:  Past Medical History   Diagnosis  Date   .  CAD (coronary artery disease)      Prior PCI to LAD in 1994   .  Alcohol abuse, in remission    .  Hyperlipidemia    .  HTN (hypertension)    .  Anxiety    .  Normal nuclear stress test  2010     EF 77%   .  Depression      Consultants: None  Procedures: None  Subjective: Patient complaining of pain and swelling in left wrist. Otherwise he feels fine. Was able to ambulate today with walker.  Objective: Vital signs in last 24 hours: Temp:  [97.6 F (36.4 C)-102.9 F (39.4 C)] 97.6 F (36.4 C) (03/04 0708) Pulse Rate:  [73-99] 73  (03/04 0708) Resp:  [18-20] 18  (03/04 0708) BP: (115-134)/(62-68) 132/62 mmHg (03/04 0708) SpO2:  [94 %-98 %] 98 % (03/04 0708) Weight change:  Last BM Date: 09/01/11  Intake/Output from previous day: 03/03 0701 - 03/04 0700 In: 1147.7 [P.O.:480; I.V.:667.7] Out: 1075 [Urine:1075] Intake/Output this shift: Total I/O In: -  Out: 650 [Urine:650]  General appearance: alert, cooperative and no distress Head: Normocephalic, without obvious abnormality, atraumatic Resp: clear to auscultation bilaterally Cardio: regular rate and rhythm,  S1, S2 normal, no murmur, click, rub or gallop GI: soft, non-tender; bowel sounds normal; no masses,  no organomegaly Extremities: Left wrsit swelling noted with limited ROM. Swelling also noted in left ankle. Pulses: 2+ and symmetric Skin: Skin color, texture, turgor normal. No rashes or lesions Lymph nodes: Cervical, supraclavicular, and axillary nodes normal. Neurologic: Alert and oriented X 3, normal strength and tone. No cranial nerve deficits.  Lab Results:  Basename 09/03/11 0445 09/02/11 0540  WBC 4.5 4.9  HGB 10.1* 9.7*  HCT 28.8* 27.3*  PLT 243 225   BMET  Basename 09/03/11 0445 09/02/11 1617  NA 137 133*  K 3.8 3.5  CL 105 101  CO2 24 22  GLUCOSE 100* 123*  BUN 8 8  CREATININE 0.87 0.96  CALCIUM 7.7* 7.1*  ALT 41 --    Studies/Results: Dg Chest Port 1 View  09/01/2011  *RADIOLOGY REPORT*  Clinical Data: Fevers  PORTABLE CHEST - 1 VIEW  Comparison: 11/04/2010  Findings: The heart size and mediastinal contours are within normal limits.  Both lungs are clear.  The visualized skeletal structures are unremarkable.  IMPRESSION: Negative exam.  Original Report Authenticated By: Rosealee Albee, M.D.    Medications:  Scheduled:    . acamprosate  666 mg Oral TID WC  . amLODipine  5 mg Oral Daily  . aspirin  81 mg Oral Daily  .  disulfiram  500 mg Oral Daily  . enoxaparin  40 mg Subcutaneous Q24H  . feeding supplement  237 mL Oral TID WC  . folic acid  1 mg Oral Daily  . ibuprofen  400 mg Oral Once  . metoprolol tartrate  12.5 mg Oral BID  . mulitivitamin with minerals  1 tablet Oral Daily  . pantoprazole  40 mg Oral Q1200  . phosphorus  500 mg Oral TID  . potassium chloride  40 mEq Oral Q4H  . thiamine  100 mg Oral Daily    Assessment/Plan:  Principal Problem:  *Generalized weakness Active Problems:  CAD (coronary artery disease)  Hyperlipidemia  HTN (hypertension)  Chronic alcoholism  Anxiety and depression  Hypokalemia   Hypomagnesemia   Generalized Weakness: Multifactorial. Probably from poor nutritional status and electrolyte abnormalities due to alcohol abuse. PT/OT eval. Will likely need SNF. Electrolytes improved today.  Acute Gout: Could account for fever. Start colchicine and NSAIDS. Allopurinol when acute flare has subsided. Uric acid was 5.8.  Fever: Etiology unclear. No source except for gout. Influenza negative. Blood cultures negative so far. Treat gout and monitor.  Hypokalemia and Hypomagnesemia: Repleted.  Alcoholism: Discharged from [facility] after detox on 2/23. Stable. No evidence for acute withdrawal. Continue Thiamine and multivitamins. Patient states he has not had a drink in 2 weeks. Currently patient's alcohol level is normal.   CAD (coronary artery disease): Stable  Anxiety and depression: Stable. Monitor closely.  HTN (hypertension): Continue home meds.  H/O Hyperlipidemia   Disposition: May need SNF. PT/OT evaluating.   LOS: 2 days   [facility] Pager [phone] 09/03/2011, 10:45 AM

## 2011-09-04 DIAGNOSIS — M109 Gout, unspecified: Secondary | ICD-10-CM | POA: Diagnosis present

## 2011-09-04 LAB — BASIC METABOLIC PANEL
Chloride: 105 mEq/L (ref 96–112)
GFR calc Af Amer: >90 mL/min (ref >90)
GFR calc non Af Amer: 86 mL/min — ABNORMAL LOW (ref >90)
Glucose, Bld: 95 mg/dL (ref 70–99)
Potassium: 3.8 mEq/L (ref 3.5–5.1)
Sodium: 139 mEq/L (ref 135–145)

## 2011-09-04 MED ORDER — FOLIC ACID 1 MG PO TABS: 1.0000 mg | ORAL_TABLET | Freq: Every day | ORAL | Status: DC

## 2011-09-04 MED ORDER — K PHOS MONO-SOD PHOS DI & MONO 155-852-130 MG PO TABS: 2.0000 | ORAL_TABLET | Freq: Two times a day (BID) | ORAL | Status: DC

## 2011-09-04 MED ORDER — DIAZEPAM 2 MG PO TABS: 2.0000 mg | ORAL_TABLET | Freq: Four times a day (QID) | ORAL | Status: AC | PRN

## 2011-09-04 MED ORDER — ACETAMINOPHEN 325 MG PO TABS: 650.0000 mg | ORAL_TABLET | Freq: Four times a day (QID) | ORAL | Status: DC | PRN

## 2011-09-04 MED ORDER — LOPERAMIDE HCL 2 MG PO TABS: 2.0000 mg | ORAL_TABLET | Freq: Four times a day (QID) | ORAL | Status: AC | PRN

## 2011-09-04 MED ORDER — COLCHICINE 0.6 MG PO TABS
0.6000 mg | ORAL_TABLET | Freq: Every day | ORAL | Status: DC
  Administered 2011-09-05: 0.6 mg via ORAL
  Filled 2011-09-04: qty 1

## 2011-09-04 MED ORDER — MAGNESIUM OXIDE 400 MG PO TABS: 400.0000 mg | ORAL_TABLET | Freq: Two times a day (BID) | ORAL | Status: DC

## 2011-09-04 MED ORDER — COLCHICINE 0.6 MG PO TABS: 0.6000 mg | ORAL_TABLET | Freq: Every day | ORAL | Status: DC

## 2011-09-04 MED ORDER — PREDNISONE 20 MG PO TABS
20.0000 mg | ORAL_TABLET | Freq: Two times a day (BID) | ORAL | Status: DC
  Administered 2011-09-04 – 2011-09-05 (×2): 20 mg via ORAL
  Filled 2011-09-04 (×3): qty 1

## 2011-09-04 MED ORDER — PREDNISONE 20 MG PO TABS: ORAL_TABLET | ORAL | Status: DC

## 2011-09-04 MED ORDER — ENSURE CLINICAL ST REVIGOR PO LIQD: 237.0000 mL | Freq: Three times a day (TID) | ORAL | Status: DC

## 2011-09-04 NOTE — Progress Notes (Signed)
CSW spoke with patient & wife, Dennis York (cell#: [phone]) re: discharge planning. Noted PT recommending that he [facility]. CSW completed FL2 and faxed out to [place]. Provided wife & patient with bed offers, wife is touring facilities this afternoon and will get back with CSW re: SNF decision. Chart copy packet & PTAR form in [place].   Unice Bailey, LCSWA [phone]

## 2011-09-04 NOTE — Progress Notes (Signed)
UR completed 

## 2011-09-04 NOTE — Progress Notes (Signed)
PCP: Does not have one  Brief HPI: [name] with chronic alcohol abuse. He left [facility] after detox 5 days ago. He states he's been weak and had difficulty walking, which is new since he went to detox. He states prior to going to detox he was walking with no issues. He received Ativan while inpatient and states his symptoms developed after this medication was given. He states he had the same reaction to Ativan 20 years prior. Since being home patient continues to feel badly, yesterday he could not walk he was incontinent on himself. this recurred today. He came to the ER. Patient denies drinking alcohol since he left detox. In the ER he was clammy and tremulous and tachycardic, he was given Valium and his symptoms improved.    Past medical history:  Past Medical History   Diagnosis  Date   .  CAD (coronary artery disease)      Prior PCI to LAD in 1994   .  Alcohol abuse, in remission    .  Hyperlipidemia    .  HTN (hypertension)    .  Anxiety    .  Normal nuclear stress test  2010     EF 77%   .  Depression      Consultants: None  Procedures: None  Subjective: Patient was nauseas earlier today. Reports loose stools. No other complaints.  Objective: Vital signs in last 24 hours: Temp:  [98.4 F (36.9 C)-98.7 F (37.1 C)] 98.7 F (37.1 C) (03/05 0625) Pulse Rate:  [78-85] 85  (03/05 0625) Resp:  [18-20] 20  (03/05 0625) BP: (132-138)/(72-78) 132/72 mmHg (03/05 0625) SpO2:  [95 %-96 %] 95 % (03/05 0625) Weight change:  Last BM Date: 09/01/11  Intake/Output from previous day: 03/04 0701 - 03/05 0700 In: 1990 [P.O.:960; I.V.:1030] Out: 1800 [Urine:1800] Intake/Output this shift: Total I/O In: -  Out: 125 [Urine:125]  General appearance: alert, cooperative and no distress Head: Normocephalic, without obvious abnormality, atraumatic Resp: clear to auscultation bilaterally Cardio: regular rate and rhythm, S1, S2 normal, no murmur, click, rub or  gallop GI: soft, non-tender; bowel sounds normal; no masses,  no organomegaly Extremities: Left wrsit swelling noted with limited ROM. Swelling also noted in left ankle. Pulses: 2+ and symmetric Skin: Skin color, texture, turgor normal. No rashes or lesions Lymph nodes: Cervical, supraclavicular, and axillary nodes normal. Neurologic: Alert and oriented X 3, normal strength and tone. No cranial nerve deficits.  Lab Results:  Basename 09/03/11 0445 09/02/11 0540  WBC 4.5 4.9  HGB 10.1* 9.7*  HCT 28.8* 27.3*  PLT 243 225   BMET  Basename 09/04/11 0530 09/03/11 0445  NA 139 137  K 3.8 3.8  CL 105 105  CO2 23 24  GLUCOSE 95 100*  BUN 6 8  CREATININE 0.82 0.87  CALCIUM 8.1* 7.7*  ALT -- 41    Studies/Results: No results found.  Medications:  Scheduled:    . acamprosate  666 mg Oral TID WC  . amLODipine  5 mg Oral Daily  . aspirin  81 mg Oral Daily  . colchicine  0.6 mg Oral Daily  . disulfiram  500 mg Oral Daily  . enoxaparin  40 mg Subcutaneous Q24H  . feeding supplement  237 mL Oral TID WC  . folic acid  1 mg Oral Daily  . ibuprofen  400 mg Oral TID WC  . magnesium oxide  400 mg Oral BID  . metoprolol tartrate  12.5 mg  Oral BID  . mulitivitamin with minerals  1 tablet Oral Daily  . pantoprazole  40 mg Oral Q1200  . phosphorus  500 mg Oral TID  . predniSONE  20 mg Oral BID WC  . thiamine  100 mg Oral Daily  . DISCONTD: colchicine  0.6 mg Oral BID    Assessment/Plan:  Principal Problem:  *Generalized weakness Active Problems:  CAD (coronary artery disease)  Hyperlipidemia  HTN (hypertension)  Chronic alcoholism  Anxiety and depression  Hypokalemia  Hypomagnesemia  Acute gouty arthritis   Generalized Weakness: Multifactorial. Probably from poor nutritional status and electrolyte abnormalities due to alcohol abuse. PT/OT has seen and recommends SNF which I agree with.   Acute Gout: Fever resolved. Decrease colchicine due to diarrhea. Add steroids.  Stop Motrin due to nausea and 'upset stomach'. Allopurinol when acute flare has subsided. Uric acid was 5.8.  Fever: Appears to have resolved. No source except for gout. Influenza negative. Blood cultures negative so far. Treat gout and monitor.  Hypokalemia, Hypophosphatemia, and Hypomagnesemia: Repleted.  Alcoholism: Discharged from [facility] after detox on 2/23. Stable. No evidence for acute withdrawal. Continue Thiamine and multivitamins. Patient states he has not had a drink in 2 weeks.   CAD (coronary artery disease): Stable  Anxiety and depression: Stable. Monitor closely.  HTN (hypertension): Continue home meds.  H/O Hyperlipidemia   Disposition: To SNF when bed is available.   LOS: 3 days   [facility] Pager [phone] 09/04/2011, 12:58 PM

## 2011-09-04 NOTE — Discharge Instructions (Signed)
Chronic Alcoholism Alcoholism is an addiction to alcohol. Addiction is a medical illness. It is not an one-time incident of heavy drinking that defines the disease of alcoholism.  The characteristics of addictive disease, such as alcoholism, include behaviors that the person finds pleasurable, at least initially. In alcohol addiction, drinking causes chemical changes in brain activity. This can lead to frequent cravings for alcohol. Unfortunately over time, an increased amount of alcohol is needed to produce the pleasure (tolerance). As a result, the person will start to feel uncomfortable symptoms when he or she is not drinking (withdrawal). Over time, a bad cycle develops. When painful withdrawal symptoms start to appear, alcohol is needed to make the symptoms go away. During this process, the person addicted to alcohol may become so used to the effects of alcohol that the usual signs of intoxication such as slurred speech, a staggering walk, or sleepiness may no longer be shown. Many people addicted to alcohol are able to function and even complete tasks. CAUSES   Drinking heavily and frequently.   Other factors like genetics.  SYMPTOMS   Headaches.   Frequent trouble falling or staying asleep (insomnia).   Irritability.   Uncontrolled shaking or movement (tremors).   Forgetting events (brownouts) or passing out (blackouts).   Seizures or hallucinations (delirium tremens).   Problems at work or at home that are related to drinking.   Medical problems related to drinking such as heart disease, stroke, high blood pressure, diabetes, stomach ulcers, bleeding from the GI tract, and liver failure.   Trauma (falls, broken bones, automobile crashes).  TREATMENT  Alcoholism usually gets worse over time and almost never gets better without treatment. Your caregiver can help recommend a course of treatment for you depending on how severe your symptoms are and the level of your alcohol abuse. In  some patients, stopping alcohol use or even decreasing use can bring about withdrawal symptoms that are dangerous or even deadly. For this reason, hospitalization is sometimes required to medically stabilize a patient.  If hospitalization is not required, but the risk of withdrawal is high, a detoxification (detox) facility may be recommended as an initial treatment step. In a detox center, medications can be given to protect against seizures and other withdrawal symptoms. Rehabilitation treatment may also be necessary. This is the process of treating the psychological and lifestyle element of addiction. There is both inpatient and outpatient rehabilitation treatment. Inpatient programs help patients through a systematic plan of psychological questioning, both individually and in groups, and at times using a "twelve-step" format. Outpatient rehabilitation programs have a similar structure and are aimed at promoting continued sobriety and preventing relapse into addiction. Make treatment decisions together with your caregiver. HOME CARE INSTRUCTIONS   If you are concerned about your alcohol use, talk to someone who can help. Trying to quit on your own is not easy. It can even be medically dangerous. This is especially true if you have been drinking heavily for a long time.   Call your caregiver, Alcoholics Anonymous, or other alcoholic treatment programs for help.   AL-ANON and ALA-TEEN are support groups for friends and family members of an alcohol or drug dependent person. These people also often need help too. For information about these organizations, check your phone directory or the internet. You can also call a local alcohol or chemical dependency treatment center.  Document Released: 07/26/2004 Document Revised: 06/07/2011 Document Reviewed: 12/02/2009 [facility] Patient Information 2012 [place], [place].  Gout Gout is an inflammatory condition (arthritis)  caused by a buildup of uric acid crystals  in the joints. Uric acid is a chemical that is normally present in the blood. Under some circumstances, uric acid can form into crystals in your joints. This causes joint redness, soreness, and swelling (inflammation). Repeat attacks are common. Over time, uric acid crystals can form into masses (tophi) near a joint, causing disfigurement. Gout is treatable and often preventable. CAUSES  The disease begins with elevated levels of uric acid in the blood. Uric acid is produced by your body when it breaks down a naturally found substance called purines. This also happens when you eat certain foods such as meats and fish. Causes of an elevated uric acid level include:  Being passed down from parent to child (heredity).   Diseases that cause increased uric acid production (obesity, psoriasis, some cancers).   Excessive alcohol use.   Diet, especially diets rich in meat and seafood.   Medicines, including certain cancer-fighting drugs (chemotherapy), diuretics, and aspirin.   Chronic kidney disease. The kidneys are no longer able to remove uric acid well.   Problems with metabolism.  Conditions strongly associated with gout include:  Obesity.   High blood pressure.   High cholesterol.   Diabetes.  Not everyone with elevated uric acid levels gets gout. It is not understood why some people get gout and others do not. Surgery, joint injury, and eating too much of certain foods are some of the factors that can lead to gout. SYMPTOMS   An attack of gout comes on quickly. It causes intense pain with redness, swelling, and warmth in a joint.   Fever can occur.   Often, only one joint is involved. Certain joints are more commonly involved:   Base of the big toe.   Knee.   Ankle.   Wrist.   Finger.  Without treatment, an attack usually goes away in a few days to weeks. Between attacks, you usually will not have symptoms, which is different from many other forms of arthritis. DIAGNOSIS    Your caregiver will suspect gout based on your symptoms and exam. Removal of fluid from the joint (arthrocentesis) is done to check for uric acid crystals. Your caregiver will give you a medicine that numbs the area (local anesthetic) and use a needle to remove joint fluid for exam. Gout is confirmed when uric acid crystals are seen in joint fluid, using a special microscope. Sometimes, blood, urine, and X-ray tests are also used. TREATMENT  There are 2 phases to gout treatment: treating the sudden onset (acute) attack and preventing attacks (prophylaxis). Treatment of an Acute Attack  Medicines are used. These include anti-inflammatory medicines or steroid medicines.   An injection of steroid medicine into the affected joint is sometimes necessary.   The painful joint is rested. Movement can worsen the arthritis.   You may use warm or cold treatments on painful joints, depending which works best for you.   Discuss the use of coffee, vitamin C, or cherries with your caregiver. These may be helpful treatment options.  Treatment to Prevent Attacks After the acute attack subsides, your caregiver may advise prophylactic medicine. These medicines either help your kidneys eliminate uric acid from your body or decrease your uric acid production. You may need to stay on these medicines for a very long time. The early phase of treatment with prophylactic medicine can be associated with an increase in acute gout attacks. For this reason, during the first few months of treatment, your caregiver may  also advise you to take medicines usually used for acute gout treatment. Be sure you understand your caregiver's directions. You should also discuss dietary treatment with your caregiver. Certain foods such as meats and fish can increase uric acid levels. Other foods such as dairy can decrease levels. Your caregiver can give you a list of foods to avoid. HOME CARE INSTRUCTIONS   Do not take aspirin to relieve  pain. This raises uric acid levels.   Only take over-the-counter or prescription medicines for pain, discomfort, or fever as directed by your caregiver.   Rest the joint as much as possible. When in bed, keep sheets and blankets off painful areas.   Keep the affected joint raised (elevated).   Use crutches if the painful joint is in your leg.   Drink enough water and fluids to keep your urine clear or pale yellow. This helps your body get rid of uric acid. Do not drink alcoholic beverages. They slow the passage of uric acid.   Follow your caregiver's dietary instructions. Pay careful attention to the amount of protein you eat. Your daily diet should emphasize fruits, vegetables, whole grains, and fat-free or low-fat milk products.   Maintain a healthy body weight.  SEEK MEDICAL CARE IF:   You have an oral temperature above 102 F (38.9 C).   You develop diarrhea, vomiting, or any side effects from medicines.   You do not feel better in 24 hours, or you are getting worse.  SEEK IMMEDIATE MEDICAL CARE IF:   Your joint becomes suddenly more tender and you have:   Chills.   An oral temperature above 102 F (38.9 C), not controlled by medicine.  MAKE SURE YOU:   Understand these instructions.   Will watch your condition.   Will get help right away if you are not doing well or get worse.  Document Released: 06/15/2000 Document Revised: 06/07/2011 Document Reviewed: 09/26/2009 [facility] Patient Information 2012 [place], [place].

## 2011-09-04 NOTE — Progress Notes (Signed)
Spoke with Kelly,CSW, regarding patient's placement into a SNF today.  She states that patient's wife is visiting several SNF and will decide which one she is happy with so discharge can proceed tomorrow.

## 2011-09-04 NOTE — Discharge Summary (Signed)
Physician Discharge Summary  Patient ID: Clemmie Marxen MRN: [id number] DOB/AGE: September 05, 1940 71 y.o.  Admit date: 09/01/2011 Discharge date: 09/04/2011  PCP: Needs new PCP  Discharge Diagnoses:  Principal Problem:  *Generalized weakness Active Problems:  CAD (coronary artery disease)  Hyperlipidemia  HTN (hypertension)  Chronic alcoholism  Anxiety and depression  Hypokalemia  Hypomagnesemia  Acute gouty arthritis  RECOMMENDATIONS 1. INITIATE ALLOPURINOL ONCE ACUTE GOUT IS BETTER  Discharged Condition: fair  Initial History: [name] with chronic alcohol abuse. He left [facility] after detox 5 days ago. He states he's been weak and had difficulty walking, which is new since he went to detox. He states prior to going to detox he was walking with no issues. He received Ativan while inpatient and states his symptoms developed after this medication was given. He states he had the same reaction to Ativan 20 years prior. Since being home patient continues to feel badly, yesterday he could not walk he was incontinent on himself. this recurred today. He came to the ER. Patient denies drinking alcohol since he left detox. In the ER he was clammy and tremulous and tachycardic, he was given Valium and his symptoms improved.   Hospital Course:   Generalized Weakness: This was multifactorial. Probably from poor nutritional status and electrolyte abnormalities due to alcohol abuse. PT/OT has seen and recommends SNF which I agree with. He does have some tremors which is a sequelae of alcoholism.  Acute Gout: This is noted in his left wrist and his feet. With colchicine there has been some improvement but he is developing diarrhea. Will initiate steroids with quick taper. Stop NSAID. Patient had fever which is attributed to Gout. Fever has resolved. Initiate Allopurinol when acute flare has subsided. Uric acid was 5.8.   Fever: Patient spiked up to 102 on two ocassions. Infectious  work up was negative. Gout is thought to be the reason.  Hypokalemia, Hypophosphatemia, and Hypomagnesemia: These were aggressively repleted.  Alcoholism: He was discharged from [facility] after detox on 2/23. He is Stable. No evidence for acute withdrawal during this hospitalization. Continue Thiamine and multivitamins. Patient states he has not had a drink in 2 weeks.   CAD (coronary artery disease): Stable   Anxiety and depression: Stable.  HTN (hypertension): BP stable on home meds.   H/O Hyperlipidemia: Stable   Patient is agreeable to go to SNF for short term rehab. Await bed availability.  Patient remains stable.   PERTINENT LABS  His potassium was 2.5, Mag was 1.0. Both these corrected. PO4 was 1.2 Hgb was 11.3. A1C was 5.6.  TFT's were normal. Influenza panel was negative  IMAGING STUDIES Dg Chest Port 1 View  09/01/2011  *RADIOLOGY REPORT*  Clinical Data: Fevers  PORTABLE CHEST - 1 VIEW  Comparison: 11/04/2010  Findings: The heart size and mediastinal contours are within normal limits.  Both lungs are clear.  The visualized skeletal structures are unremarkable.  IMPRESSION: Negative exam.  Original Report Authenticated By: Rosealee Albee, M.D.    Disposition: SNF  Discharge Orders    Future Orders Please Complete By Expires   Diet - low sodium heart healthy      Increase activity slowly        Current Discharge Medication List    START taking these medications   Details  acetaminophen (TYLENOL) 325 MG tablet Take 2 tablets (650 mg total) by mouth every 6 (six) hours as needed.    colchicine 0.6 MG tablet Take 1 tablet (  0.6 mg total) by mouth daily.    diazepam (VALIUM) 2 MG tablet Take 1 tablet (2 mg total) by mouth every 6 (six) hours as needed for anxiety. Qty: 30 tablet, Refills: 0    feeding supplement (ENSURE CLINICAL STRENGTH) LIQD Take 237 mLs by mouth 3 (three) times daily with meals.    folic acid (FOLVITE) 1 MG tablet Take 1 tablet (1 mg total) by  mouth daily.    loperamide (IMODIUM A-D) 2 MG tablet Take 1 tablet (2 mg total) by mouth 4 (four) times daily as needed for diarrhea or loose stools. Qty: 30 tablet, Refills: 0    magnesium oxide (MAG-OX) 400 MG tablet Take 1 tablet (400 mg total) by mouth 2 (two) times daily.    phosphorus (K PHOS NEUTRAL) 155-852-130 MG tablet Take 2 tablets by mouth 2 (two) times daily.    predniSONE (DELTASONE) 20 MG tablet 1 tablet twice daily for 4 days, then 1 tablet once daily for 5 days, then stop.      CONTINUE these medications which have NOT CHANGED   Details  amLODipine (NORVASC) 5 MG tablet Take 1 tablet (5 mg total) by mouth daily. For control of high blood pressure Qty: 30 tablet, Refills: 0    aspirin 81 MG tablet Take 1 tablet (81 mg total) by mouth daily. For prevention of stroke and heart attack Qty: 30 tablet, Refills: 0    metoprolol tartrate (LOPRESSOR) 25 MG tablet Take 0.5 tablets (12.5 mg total) by mouth 2 (two) times daily. For control of high blood pressure Qty: 30 tablet, Refills: 0    mirtazapine (REMERON SOL-TAB) 15 MG disintegrating tablet Take 1 tablet (15 mg total) by mouth at bedtime as needed (As needed for sleep). For insomnia Qty: 30 tablet, Refills: 0    Multiple Vitamin (MULITIVITAMIN WITH MINERALS) TABS Take 1 tablet by mouth daily. For nutritional supplementation. Qty: 30 tablet, Refills: 0    omeprazole (PRILOSEC OTC) 20 MG tablet Take 1 tablet (20 mg total) by mouth daily. For control of stomach acid secretion and helps GERD. Qty: 30 tablet, Refills: 0    thiamine 100 MG tablet Take 1 tablet (100 mg total) by mouth daily. For nutritional supplementation. Qty: 30 tablet, Refills: 0    tetrahydrozoline 0.05 % ophthalmic solution Place 1 drop into both eyes daily. For reduction of eye redness Qty: 15 mL, Refills: 0      STOP taking these medications     acamprosate (CAMPRAL) 333 MG tablet      disulfiram (ANTABUSE) 250 MG tablet        Follow-up  Information    Follow up with Needs New PCP.         Total Discharge Time: 35 mins  [facility]  [facility] Pager [phone]  09/04/2011, 1:03 PM

## 2011-09-05 NOTE — Discharge Summary (Signed)
Patient seen and examined.  Having some loose bowels after recent initiation of allopurinol which he has had in the past reportedly (by patient).  Currently tolerable.  Denies any fever, chills, nausea, emesis, or abdominal discomfort.  Tolerating po intake well.  General: Alert, awake, oriented x3, in no acute distress. HEENT: No bruits, no goiter. Heart: Regular rate and rhythm, without murmurs, rubs, gallops. Lungs: Clear to auscultation bilaterally. Abdomen: Soft, nontender, nondistended, positive bowel sounds. Extremities: No clubbing cyanosis or edema with positive pedal pulses. Neuro: Grossly intact, nonfocal.  Please refer to d/c summary on 09/04/11 by my associate Dr. Rito Ehrlich for further details.

## 2011-09-05 NOTE — Progress Notes (Signed)
Physical Therapy Treatment Patient Details Name: Dennis York MRN: [id number] DOB: 1941-01-03 Today's Date: 09/05/2011  PT Assessment/Plan  PT - Assessment/Plan Comments on Treatment Session: Pt continues to be unsteady with ambulation.  Pain improved per patient from prior treatment.  Pt for possible d/c to SNF today. PT Plan: Discharge plan remains appropriate PT Frequency: Min 3X/week Follow Up Recommendations: Home health PT;Skilled nursing facility;Supervision for mobility/OOB Equipment Recommended: Defer to next venue PT Goals  Acute Rehab PT Goals PT Goal: Supine/Side to Sit - Progress: Progressing toward goal PT Goal: Sit to Stand - Progress: Progressing toward goal PT Goal: Stand to Sit - Progress: Progressing toward goal PT Goal: Ambulate - Progress: Progressing toward goal  PT Treatment Precautions/Restrictions  Precautions Precautions: Fall Required Braces or Orthoses: No Restrictions Weight Bearing Restrictions: No Mobility (including Balance) Bed Mobility Bed Mobility: Yes Supine to Sit: 5: Supervision Transfers Transfers: Yes Sit to Stand: 4: Min assist;From bed;With upper extremity assist Sit to Stand Details (indicate cue type and reason): cues for hand placment Stand to Sit: 4: Min assist;With upper extremity assist;With armrests;To chair/3-in-1 Stand to Sit Details: cues for hand placment Ambulation/Gait Ambulation/Gait: Yes Ambulation/Gait Assistance: 4: Min assist Ambulation/Gait Assistance Details (indicate cue type and reason): min-mod cues for safety and technique with RW.  Unsteady with turns. Ambulation Distance (Feet): 100 Feet Assistive device: Rolling walker Gait Pattern: Step-through pattern;Decreased stride length;Trunk flexed;Shuffle Gait velocity: decreased Stairs: No Wheelchair Mobility Wheelchair Mobility: No    Exercise    End of Session PT - End of Session Equipment Utilized During Treatment: Gait belt Activity Tolerance:  Patient tolerated treatment well Patient left: in chair;with call bell in reach General Behavior During Session: [facility] for tasks performed Cognition: [facility] for tasks performed  Newell Coral 09/05/2011, 1:24 PM  Newell Coral, PTA

## 2011-09-05 NOTE — Progress Notes (Signed)
Patient set to discharge to [facility] SN[facility] today. Wife & patient aware. [facility] called & scheduled for transportation to pick up patient at 3:00p today.   Unice Bailey, LCSWA [phone]

## 2011-09-06 DIAGNOSIS — I10 Essential (primary) hypertension: Secondary | ICD-10-CM | POA: Diagnosis not present

## 2011-09-06 DIAGNOSIS — I2510 Atherosclerotic heart disease of native coronary artery without angina pectoris: Secondary | ICD-10-CM | POA: Diagnosis not present

## 2011-09-06 DIAGNOSIS — F411 Generalized anxiety disorder: Secondary | ICD-10-CM | POA: Diagnosis not present

## 2011-09-06 DIAGNOSIS — R5381 Other malaise: Secondary | ICD-10-CM | POA: Diagnosis not present

## 2011-09-08 LAB — CULTURE, BLOOD (ROUTINE X 2): Culture: NO GROWTH

## 2011-10-01 DIAGNOSIS — Z862 Personal history of diseases of the blood and blood-forming organs and certain disorders involving the immune mechanism: Secondary | ICD-10-CM

## 2011-10-01 DIAGNOSIS — E538 Deficiency of other specified B group vitamins: Secondary | ICD-10-CM

## 2011-10-01 HISTORY — DX: Deficiency of other specified B group vitamins: E53.8

## 2011-10-01 HISTORY — DX: Personal history of diseases of the blood and blood-forming organs and certain disorders involving the immune mechanism: Z86.2

## 2011-10-10 DIAGNOSIS — E7800 Pure hypercholesterolemia, unspecified: Secondary | ICD-10-CM | POA: Diagnosis not present

## 2011-10-10 DIAGNOSIS — I10 Essential (primary) hypertension: Secondary | ICD-10-CM | POA: Diagnosis not present

## 2011-10-10 DIAGNOSIS — M1990 Unspecified osteoarthritis, unspecified site: Secondary | ICD-10-CM | POA: Diagnosis not present

## 2011-10-10 DIAGNOSIS — I2510 Atherosclerotic heart disease of native coronary artery without angina pectoris: Secondary | ICD-10-CM | POA: Diagnosis not present

## 2011-10-10 DIAGNOSIS — Z79899 Other long term (current) drug therapy: Secondary | ICD-10-CM | POA: Diagnosis not present

## 2011-10-16 DIAGNOSIS — Z79899 Other long term (current) drug therapy: Secondary | ICD-10-CM | POA: Diagnosis not present

## 2011-10-16 DIAGNOSIS — D539 Nutritional anemia, unspecified: Secondary | ICD-10-CM | POA: Diagnosis not present

## 2011-10-16 DIAGNOSIS — E7800 Pure hypercholesterolemia, unspecified: Secondary | ICD-10-CM | POA: Diagnosis not present

## 2011-10-25 DIAGNOSIS — E7800 Pure hypercholesterolemia, unspecified: Secondary | ICD-10-CM | POA: Diagnosis not present

## 2011-10-25 DIAGNOSIS — I2510 Atherosclerotic heart disease of native coronary artery without angina pectoris: Secondary | ICD-10-CM | POA: Diagnosis not present

## 2011-10-25 DIAGNOSIS — I10 Essential (primary) hypertension: Secondary | ICD-10-CM | POA: Diagnosis not present

## 2011-11-06 DIAGNOSIS — Z23 Encounter for immunization: Secondary | ICD-10-CM | POA: Diagnosis not present

## 2011-11-06 DIAGNOSIS — E7800 Pure hypercholesterolemia, unspecified: Secondary | ICD-10-CM | POA: Diagnosis not present

## 2011-11-06 DIAGNOSIS — I10 Essential (primary) hypertension: Secondary | ICD-10-CM | POA: Diagnosis not present

## 2011-11-06 DIAGNOSIS — D649 Anemia, unspecified: Secondary | ICD-10-CM | POA: Diagnosis not present

## 2011-11-06 DIAGNOSIS — Z79899 Other long term (current) drug therapy: Secondary | ICD-10-CM | POA: Diagnosis not present

## 2011-11-06 DIAGNOSIS — G5710 Meralgia paresthetica, unspecified lower limb: Secondary | ICD-10-CM | POA: Diagnosis not present

## 2011-11-06 DIAGNOSIS — M7130 Other bursal cyst, unspecified site: Secondary | ICD-10-CM | POA: Diagnosis not present

## 2011-12-05 DIAGNOSIS — H6120 Impacted cerumen, unspecified ear: Secondary | ICD-10-CM | POA: Diagnosis not present

## 2011-12-05 DIAGNOSIS — R42 Dizziness and giddiness: Secondary | ICD-10-CM | POA: Diagnosis not present

## 2011-12-05 DIAGNOSIS — I10 Essential (primary) hypertension: Secondary | ICD-10-CM | POA: Diagnosis not present

## 2011-12-11 DIAGNOSIS — D649 Anemia, unspecified: Secondary | ICD-10-CM | POA: Diagnosis not present

## 2011-12-11 DIAGNOSIS — E7800 Pure hypercholesterolemia, unspecified: Secondary | ICD-10-CM | POA: Diagnosis not present

## 2011-12-11 DIAGNOSIS — Z79899 Other long term (current) drug therapy: Secondary | ICD-10-CM | POA: Diagnosis not present

## 2011-12-12 DIAGNOSIS — H8190 Unspecified disorder of vestibular function, unspecified ear: Secondary | ICD-10-CM | POA: Diagnosis not present

## 2011-12-12 DIAGNOSIS — H6120 Impacted cerumen, unspecified ear: Secondary | ICD-10-CM | POA: Diagnosis not present

## 2011-12-21 DIAGNOSIS — I2510 Atherosclerotic heart disease of native coronary artery without angina pectoris: Secondary | ICD-10-CM | POA: Diagnosis not present

## 2011-12-21 DIAGNOSIS — I10 Essential (primary) hypertension: Secondary | ICD-10-CM | POA: Diagnosis not present

## 2012-02-13 DIAGNOSIS — H2510 Age-related nuclear cataract, unspecified eye: Secondary | ICD-10-CM | POA: Diagnosis not present

## 2012-02-13 DIAGNOSIS — Z79899 Other long term (current) drug therapy: Secondary | ICD-10-CM | POA: Diagnosis not present

## 2012-02-13 DIAGNOSIS — E7800 Pure hypercholesterolemia, unspecified: Secondary | ICD-10-CM | POA: Diagnosis not present

## 2012-02-19 DIAGNOSIS — M1710 Unilateral primary osteoarthritis, unspecified knee: Secondary | ICD-10-CM | POA: Diagnosis not present

## 2012-02-19 DIAGNOSIS — H2510 Age-related nuclear cataract, unspecified eye: Secondary | ICD-10-CM | POA: Diagnosis not present

## 2012-02-19 DIAGNOSIS — H11159 Pinguecula, unspecified eye: Secondary | ICD-10-CM | POA: Diagnosis not present

## 2012-02-19 DIAGNOSIS — H35379 Puckering of macula, unspecified eye: Secondary | ICD-10-CM | POA: Diagnosis not present

## 2012-02-19 DIAGNOSIS — Z961 Presence of intraocular lens: Secondary | ICD-10-CM | POA: Diagnosis not present

## 2012-02-25 DIAGNOSIS — H2510 Age-related nuclear cataract, unspecified eye: Secondary | ICD-10-CM | POA: Diagnosis not present

## 2012-03-10 DIAGNOSIS — H2510 Age-related nuclear cataract, unspecified eye: Secondary | ICD-10-CM | POA: Diagnosis not present

## 2012-03-10 DIAGNOSIS — H269 Unspecified cataract: Secondary | ICD-10-CM | POA: Diagnosis not present

## 2012-03-31 DIAGNOSIS — E7800 Pure hypercholesterolemia, unspecified: Secondary | ICD-10-CM | POA: Diagnosis not present

## 2012-03-31 DIAGNOSIS — Z01810 Encounter for preprocedural cardiovascular examination: Secondary | ICD-10-CM | POA: Diagnosis not present

## 2012-03-31 DIAGNOSIS — I2510 Atherosclerotic heart disease of native coronary artery without angina pectoris: Secondary | ICD-10-CM | POA: Diagnosis not present

## 2012-03-31 DIAGNOSIS — M25569 Pain in unspecified knee: Secondary | ICD-10-CM | POA: Diagnosis not present

## 2012-04-08 DIAGNOSIS — E7800 Pure hypercholesterolemia, unspecified: Secondary | ICD-10-CM | POA: Diagnosis not present

## 2012-04-08 DIAGNOSIS — Z01810 Encounter for preprocedural cardiovascular examination: Secondary | ICD-10-CM | POA: Diagnosis not present

## 2012-04-08 DIAGNOSIS — M25569 Pain in unspecified knee: Secondary | ICD-10-CM | POA: Diagnosis not present

## 2012-04-08 DIAGNOSIS — I2510 Atherosclerotic heart disease of native coronary artery without angina pectoris: Secondary | ICD-10-CM | POA: Diagnosis not present

## 2012-04-09 DIAGNOSIS — E7800 Pure hypercholesterolemia, unspecified: Secondary | ICD-10-CM | POA: Diagnosis not present

## 2012-04-09 DIAGNOSIS — Z79899 Other long term (current) drug therapy: Secondary | ICD-10-CM | POA: Diagnosis not present

## 2012-04-09 DIAGNOSIS — M2550 Pain in unspecified joint: Secondary | ICD-10-CM | POA: Diagnosis not present

## 2012-04-09 DIAGNOSIS — I10 Essential (primary) hypertension: Secondary | ICD-10-CM | POA: Diagnosis not present

## 2012-04-09 DIAGNOSIS — R490 Dysphonia: Secondary | ICD-10-CM | POA: Diagnosis not present

## 2012-04-09 DIAGNOSIS — Z23 Encounter for immunization: Secondary | ICD-10-CM | POA: Diagnosis not present

## 2012-04-15 DIAGNOSIS — M1710 Unilateral primary osteoarthritis, unspecified knee: Secondary | ICD-10-CM | POA: Diagnosis not present

## 2012-04-18 ENCOUNTER — Encounter (HOSPITAL_COMMUNITY): Payer: Self-pay | Admitting: Pharmacy Technician

## 2012-04-21 ENCOUNTER — Encounter (HOSPITAL_COMMUNITY)
Admission: RE | Admit: 2012-04-21 | Discharge: 2012-04-21 | Disposition: A | Discharge status: Home / Self Care | Payer: Medicare Other | Source: Ambulatory Visit | Attending: Orthopedic Surgery | Admitting: Orthopedic Surgery

## 2012-04-21 ENCOUNTER — Encounter (HOSPITAL_COMMUNITY)
Admission: RE | Admit: 2012-04-21 | Discharge: 2012-04-21 | Disposition: A | Discharge status: Home / Self Care | Payer: Medicare Other | Source: Ambulatory Visit | Attending: Surgery | Admitting: Surgery

## 2012-04-21 ENCOUNTER — Encounter (HOSPITAL_COMMUNITY): Payer: Self-pay

## 2012-04-21 DIAGNOSIS — Z01811 Encounter for preprocedural respiratory examination: Secondary | ICD-10-CM | POA: Diagnosis not present

## 2012-04-21 HISTORY — DX: Gastro-esophageal reflux disease without esophagitis: K21.9

## 2012-04-21 HISTORY — DX: Unspecified osteoarthritis, unspecified site: M19.90

## 2012-04-21 HISTORY — DX: Other complications of anesthesia, initial encounter: T88.59XA

## 2012-04-21 HISTORY — DX: Adverse effect of unspecified anesthetic, initial encounter: T41.45XA

## 2012-04-21 HISTORY — DX: Personal history of other diseases of the digestive system: Z87.19

## 2012-04-21 LAB — SURGICAL PCR SCREEN: Staphylococcus aureus: POSITIVE — AB

## 2012-04-21 LAB — COMPREHENSIVE METABOLIC PANEL
Alkaline Phosphatase: 112 U/L (ref 39–117)
BUN: 13 mg/dL (ref 6–23)
GFR calc Af Amer: >90 mL/min (ref >90)
GFR calc non Af Amer: 84 mL/min — ABNORMAL LOW (ref >90)
Glucose, Bld: 93 mg/dL (ref 70–99)
Potassium: 4.2 mEq/L (ref 3.5–5.1)
Total Protein: 7.9 g/dL (ref 6.0–8.3)

## 2012-04-21 LAB — URINALYSIS, ROUTINE W REFLEX MICROSCOPIC
Bilirubin Urine: NEGATIVE
Glucose, UA: NEGATIVE mg/dL
Hgb urine dipstick: NEGATIVE
Specific Gravity, Urine: 1.012 (ref 1.005–1.030)
Urobilinogen, UA: 0.2 mg/dL (ref 0.0–1.0)
pH: 6 (ref 5.0–8.0)

## 2012-04-21 LAB — PROTIME-INR: Prothrombin Time: 13.3 seconds (ref 11.6–15.2)

## 2012-04-21 LAB — CBC
HCT: 39.3 % (ref 39.0–52.0)
Hemoglobin: 13.7 g/dL (ref 13.0–17.0)
MCH: 32.2 pg (ref 26.0–34.0)
MCHC: 34.9 g/dL (ref 30.0–36.0)

## 2012-04-21 LAB — APTT: aPTT: 34 seconds (ref 24–37)

## 2012-04-21 LAB — TYPE AND SCREEN: Antibody Screen: NEGATIVE

## 2012-04-21 NOTE — Pre-Procedure Instructions (Signed)
[address]  04/21/2012   Your procedure is scheduled on:  Wednesday April 30, 2012  Report to [facility] at 8:30 AM.  Call this number if you have problems the morning of surgery: [phone]   Remember:   Do not eat food or drink After Midnight.      Take these medicines the morning of surgery with A SIP OF WATER: amlodipine, metoprolol, omeprazole, welchol   Do not wear jewelry, make-up or nail polish.  Do not wear lotions, powders, or perfumes.  Do not shave 48 hours prior to surgery. Men may shave face and neck.  Do not bring valuables to the hospital.  Contacts, dentures or bridgework may not be worn into surgery.  Leave suitcase in the car. After surgery it may be brought to your room.  For patients admitted to the hospital, checkout time is 11:00 AM the day of discharge.   Patients discharged the day of surgery will not be allowed to drive home.  Name and phone number of your driver: family / friend  Special Instructions: Shower using CHG 2 nights before surgery and the night before surgery.  If you shower the day of surgery use CHG.  Use special wash - you have one bottle of CHG for all showers.  You should use approximately 1/3 of the bottle for each shower.   Please read over the following fact sheets that you were given: Pain Booklet, Coughing and Deep Breathing, Blood Transfusion Information, Total Joint Packet, MRSA Information and Surgical Site Infection Prevention

## 2012-04-21 NOTE — Progress Notes (Signed)
Contacted Dr. Dione Housekeeper office, left message for Dennis York in medical records for copy of last office visit notes, EKG, Stress test and clearance letter.

## 2012-04-21 NOTE — Progress Notes (Signed)
Chart forwarded to anesthesia for review.

## 2012-04-23 ENCOUNTER — Encounter (HOSPITAL_COMMUNITY): Payer: Self-pay | Admitting: Vascular Surgery

## 2012-04-23 NOTE — Consult Note (Signed)
Anesthesia chart review: Patient is a 71 year old male scheduled for left total knee replacement by Dr. Eulah Pont on 04/30/2012. History includes former smoker, hyperlipidemia, hypertension, CAD s/p LAD stent '94, depression, anxiety, GERD, kidney stones, hiatal hernia, arthritis, alcohol abuse currently sober, allergy to barbiturates.  PCP is Dr. Pete Glatter.  Cardiologist is Dr. Anne Fu.  He saw patient on 03/31/2012 for preoperative clearance. He ordered a nuclear stress test that was done on 04/08/2012 that was "Low risk, no ischemia, normal EF," and he was cleared for this procedure with overall low cardiac risk.    EKG on 10/25/11 [facility]) showed SR with first degree AVB.  CXR on 04/21/12 showed: 1. No radiographic evidence of acute cardiopulmonary disease.  2. Atherosclerosis in the thoracic aorta.  3. Healed fracture of the posterolateral aspect of the right fifth rib.   Labs noted.   If no significant change in his status then anticipate he can proceed as planned.  Shonna Chock, PA-C

## 2012-04-29 MED ORDER — VANCOMYCIN HCL IN DEXTROSE 1-5 GM/200ML-% IV SOLN
1000.0000 mg | INTRAVENOUS | Status: AC
  Administered 2012-04-30: 1000 mg via INTRAVENOUS
  Filled 2012-04-29: qty 200

## 2012-04-29 NOTE — H&P (Signed)
  [facility] [address]   [address] [place], [place] [address] [phone] A Division of [facility]  Loreta Ave, M.D.   Robert A. Thurston Hole, M.D.   Burnell Blanks, M.D.   Eulas Post, M.D.   Lunette Stands, M.D Buford Dresser, M.D.  Charlsie Quest, M.D.   Estell Harpin, M.D.   Melina Fiddler, M.D. Genene Churn. Barry Dienes, PA-C            Kirstin A. Shepperson, PA-C Josh Savage, PA-C [place], [place]   RE: [place], [place]   [id number]      DOB: 05-Jun-1941 PROGRESS NOTE: 04-15-12 Chief complaint: left knee pain. History of present illness: 71 year old white male with left knee degenerative joint disease and chronic pain returns. Symptoms are unchanged and he wants to proceed with total knee replacement as scheduled. We received pre-op clearance.  Current medications: Amlodipine aspirin Toprol multivitamin Prilosec. Allergy to Ativan and statin.  Past medical/surgical history: hypertension coronary artery disease hypercholesterolemia depression/anxiety gout hiatal hernia alcoholism right total knee replacement cardiac cath with angioplasty.  Family history: positive hypertension breast cancer. 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 Review of systems: denies light headedness dizziness fever chills cardiopulmonary GI GU issues.  EXAMINATION: Alert and oriented x3 in no acute distress. Head is normal cephalic atraumatic. PERRLA Extraocular motion is intact. Cervical spine unremarkable. Lungs CTA bilaterally no wheezes. Heart regular rate and rhythm no murmur noted. Abdomen round non-distended. NBS x4 soft non-tender. Left knee decreased range of motion positive crepitus joint line tenderness ligaments stable. positive effusion. Calf non-tender neurovascularly intact. Skin warm and dry. No increase in respiratory effort. Gait antalgic.   X-RAYS: Left knee shows end stage  degenerative joint disease with periarticular spurs.  IMPRESSION: Left knee end stage degenerative joint disease with chronic pain.  DISPOSITION: Will proceed with left total knee replacement as scheduled. Discussed risks benefits and possible complications and recovery time. All questions answered.  Loreta Ave, M.D.  Electronically verified by Loreta Ave, M.D. DFM(JMO):kh D 04-29-12 T 04-29-12

## 2012-04-29 NOTE — Progress Notes (Signed)
Patient requested to arrive at 07:00 patient concerned about being told different arrival times. Explained to patient that surgery time has changed several times and that we would like to have him ready so that surgery did not get delayed.

## 2012-04-30 ENCOUNTER — Inpatient Hospital Stay (HOSPITAL_COMMUNITY): Payer: Medicare Other | Admitting: Vascular Surgery

## 2012-04-30 ENCOUNTER — Encounter (HOSPITAL_COMMUNITY): Payer: Self-pay | Admitting: Vascular Surgery

## 2012-04-30 ENCOUNTER — Inpatient Hospital Stay (HOSPITAL_COMMUNITY)
Admission: RE | Admit: 2012-04-30 | Discharge: 2012-05-02 | DRG: 470 | Disposition: A | Discharge status: Home Health | Payer: Medicare Other | Source: Ambulatory Visit | Attending: Orthopedic Surgery | Admitting: Orthopedic Surgery

## 2012-04-30 ENCOUNTER — Inpatient Hospital Stay (HOSPITAL_COMMUNITY): Payer: Medicare Other

## 2012-04-30 ENCOUNTER — Encounter (HOSPITAL_COMMUNITY)
Admission: RE | Disposition: A | Discharge status: Home Health | Payer: Self-pay | Source: Ambulatory Visit | Attending: Orthopedic Surgery

## 2012-04-30 DIAGNOSIS — G8918 Other acute postprocedural pain: Secondary | ICD-10-CM | POA: Diagnosis not present

## 2012-04-30 DIAGNOSIS — I10 Essential (primary) hypertension: Secondary | ICD-10-CM | POA: Diagnosis not present

## 2012-04-30 DIAGNOSIS — Z8249 Family history of ischemic heart disease and other diseases of the circulatory system: Secondary | ICD-10-CM

## 2012-04-30 DIAGNOSIS — Z471 Aftercare following joint replacement surgery: Secondary | ICD-10-CM | POA: Diagnosis not present

## 2012-04-30 DIAGNOSIS — F329 Major depressive disorder, single episode, unspecified: Secondary | ICD-10-CM | POA: Diagnosis present

## 2012-04-30 DIAGNOSIS — Z87891 Personal history of nicotine dependence: Secondary | ICD-10-CM

## 2012-04-30 DIAGNOSIS — E7800 Pure hypercholesterolemia, unspecified: Secondary | ICD-10-CM | POA: Diagnosis present

## 2012-04-30 DIAGNOSIS — M1710 Unilateral primary osteoarthritis, unspecified knee: Principal | ICD-10-CM | POA: Diagnosis present

## 2012-04-30 DIAGNOSIS — I2510 Atherosclerotic heart disease of native coronary artery without angina pectoris: Secondary | ICD-10-CM | POA: Diagnosis not present

## 2012-04-30 DIAGNOSIS — Z96659 Presence of unspecified artificial knee joint: Secondary | ICD-10-CM

## 2012-04-30 DIAGNOSIS — F1020 Alcohol dependence, uncomplicated: Secondary | ICD-10-CM | POA: Diagnosis present

## 2012-04-30 DIAGNOSIS — F411 Generalized anxiety disorder: Secondary | ICD-10-CM | POA: Diagnosis present

## 2012-04-30 DIAGNOSIS — F3289 Other specified depressive episodes: Secondary | ICD-10-CM | POA: Diagnosis present

## 2012-04-30 DIAGNOSIS — M109 Gout, unspecified: Secondary | ICD-10-CM | POA: Diagnosis present

## 2012-04-30 DIAGNOSIS — Z01812 Encounter for preprocedural laboratory examination: Secondary | ICD-10-CM

## 2012-04-30 DIAGNOSIS — Z9861 Coronary angioplasty status: Secondary | ICD-10-CM

## 2012-04-30 DIAGNOSIS — K449 Diaphragmatic hernia without obstruction or gangrene: Secondary | ICD-10-CM | POA: Diagnosis present

## 2012-04-30 HISTORY — DX: Effusion, left ankle: M25.471

## 2012-04-30 HISTORY — DX: Effusion, left ankle: M25.472

## 2012-04-30 HISTORY — PX: TOTAL KNEE ARTHROPLASTY: SHX125

## 2012-04-30 HISTORY — DX: Gastrointestinal hemorrhage, unspecified: K92.2

## 2012-04-30 SURGERY — ARTHROPLASTY, KNEE, TOTAL
Anesthesia: General | Site: Knee | Laterality: Left | Wound class: Clean

## 2012-04-30 MED ORDER — FENTANYL CITRATE 0.05 MG/ML IJ SOLN
INTRAMUSCULAR | Status: DC | PRN
  Administered 2012-04-30 (×2): 25 ug via INTRAVENOUS
  Administered 2012-04-30: 50 ug via INTRAVENOUS
  Administered 2012-04-30 (×2): 25 ug via INTRAVENOUS
  Administered 2012-04-30: 100 ug via INTRAVENOUS

## 2012-04-30 MED ORDER — METOCLOPRAMIDE HCL 5 MG/ML IJ SOLN: 5.0000 mg | Freq: Three times a day (TID) | INTRAMUSCULAR | Status: DC | PRN

## 2012-04-30 MED ORDER — HYDROMORPHONE HCL PF 1 MG/ML IJ SOLN
INTRAMUSCULAR | Status: AC
  Administered 2012-04-30: 0.5 mg via INTRAVENOUS
  Filled 2012-04-30: qty 1

## 2012-04-30 MED ORDER — METHOCARBAMOL 500 MG PO TABS
500.0000 mg | ORAL_TABLET | Freq: Four times a day (QID) | ORAL | Status: DC | PRN
  Administered 2012-05-02: 500 mg via ORAL
  Filled 2012-04-30: qty 1

## 2012-04-30 MED ORDER — PROMETHAZINE HCL 25 MG/ML IJ SOLN: 6.2500 mg | INTRAMUSCULAR | Status: DC | PRN

## 2012-04-30 MED ORDER — METOCLOPRAMIDE HCL 10 MG PO TABS: 5.0000 mg | ORAL_TABLET | Freq: Three times a day (TID) | ORAL | Status: DC | PRN

## 2012-04-30 MED ORDER — SODIUM CHLORIDE 0.9 % IR SOLN
Status: DC | PRN
  Administered 2012-04-30: 4000 mL

## 2012-04-30 MED ORDER — ONDANSETRON HCL 4 MG/2ML IJ SOLN: 4.0000 mg | Freq: Four times a day (QID) | INTRAMUSCULAR | Status: DC | PRN

## 2012-04-30 MED ORDER — MORPHINE SULFATE 4 MG/ML IJ SOLN
INTRAMUSCULAR | Status: AC
  Filled 2012-04-30: qty 1

## 2012-04-30 MED ORDER — MORPHINE SULFATE 4 MG/ML IJ SOLN
INTRAMUSCULAR | Status: DC | PRN
  Administered 2012-04-30: 4 mg via INTRAVENOUS

## 2012-04-30 MED ORDER — PROPOFOL 10 MG/ML IV BOLUS
INTRAVENOUS | Status: DC | PRN
  Administered 2012-04-30: 180 mg via INTRAVENOUS

## 2012-04-30 MED ORDER — BISACODYL 5 MG PO TBEC: 5.0000 mg | DELAYED_RELEASE_TABLET | Freq: Every day | ORAL | Status: DC | PRN

## 2012-04-30 MED ORDER — OXYCODONE HCL 5 MG PO TABS
5.0000 mg | ORAL_TABLET | Freq: Once | ORAL | Status: DC | PRN
Start: 2012-04-30 — End: 2012-04-30

## 2012-04-30 MED ORDER — SENNOSIDES-DOCUSATE SODIUM 8.6-50 MG PO TABS: 1.0000 | ORAL_TABLET | Freq: Every evening | ORAL | Status: DC | PRN

## 2012-04-30 MED ORDER — POTASSIUM CHLORIDE IN NACL 20-0.9 MEQ/L-% IV SOLN
INTRAVENOUS | Status: DC
  Administered 2012-04-30 – 2012-05-01 (×2): via INTRAVENOUS
  Filled 2012-04-30 (×6): qty 1000

## 2012-04-30 MED ORDER — PANTOPRAZOLE SODIUM 40 MG PO TBEC
40.0000 mg | DELAYED_RELEASE_TABLET | Freq: Every day | ORAL | Status: DC
  Administered 2012-05-02: 40 mg via ORAL
  Filled 2012-04-30: qty 1

## 2012-04-30 MED ORDER — ACETAMINOPHEN 650 MG RE SUPP: 650.0000 mg | Freq: Four times a day (QID) | RECTAL | Status: DC | PRN

## 2012-04-30 MED ORDER — MENTHOL 3 MG MT LOZG: 1.0000 | LOZENGE | OROMUCOSAL | Status: DC | PRN

## 2012-04-30 MED ORDER — OXYCODONE HCL 5 MG/5ML PO SOLN: 5.0000 mg | Freq: Once | ORAL | Status: DC | PRN

## 2012-04-30 MED ORDER — MIDAZOLAM HCL 5 MG/5ML IJ SOLN
INTRAMUSCULAR | Status: DC | PRN
  Administered 2012-04-30: 2 mg via INTRAVENOUS

## 2012-04-30 MED ORDER — PHENOL 1.4 % MT LIQD: 1.0000 | OROMUCOSAL | Status: DC | PRN

## 2012-04-30 MED ORDER — VANCOMYCIN HCL IN DEXTROSE 1-5 GM/200ML-% IV SOLN
1000.0000 mg | Freq: Two times a day (BID) | INTRAVENOUS | Status: AC
  Administered 2012-04-30 – 2012-05-01 (×2): 1000 mg via INTRAVENOUS
  Filled 2012-04-30 (×3): qty 200

## 2012-04-30 MED ORDER — METOPROLOL TARTRATE 12.5 MG HALF TABLET
12.5000 mg | ORAL_TABLET | Freq: Two times a day (BID) | ORAL | Status: DC
  Administered 2012-04-30 – 2012-05-02 (×4): 12.5 mg via ORAL
  Filled 2012-04-30 (×5): qty 1

## 2012-04-30 MED ORDER — LACTATED RINGERS IV SOLN
INTRAVENOUS | Status: DC | PRN
  Administered 2012-04-30 (×2): via INTRAVENOUS

## 2012-04-30 MED ORDER — HYDROMORPHONE HCL PF 1 MG/ML IJ SOLN: 1.0000 mg | INTRAMUSCULAR | Status: DC | PRN

## 2012-04-30 MED ORDER — AMLODIPINE BESYLATE 5 MG PO TABS
5.0000 mg | ORAL_TABLET | Freq: Every day | ORAL | Status: DC
  Administered 2012-05-01 – 2012-05-02 (×2): 5 mg via ORAL
  Filled 2012-04-30 (×2): qty 1

## 2012-04-30 MED ORDER — ACETAMINOPHEN 325 MG PO TABS
650.0000 mg | ORAL_TABLET | Freq: Four times a day (QID) | ORAL | Status: DC | PRN
  Administered 2012-05-01 – 2012-05-02 (×4): 650 mg via ORAL
  Filled 2012-04-30 (×4): qty 2

## 2012-04-30 MED ORDER — OMEPRAZOLE MAGNESIUM 20 MG PO TBEC: 20.0000 mg | DELAYED_RELEASE_TABLET | Freq: Every day | ORAL | Status: DC

## 2012-04-30 MED ORDER — HYDROMORPHONE HCL PF 1 MG/ML IJ SOLN
0.2500 mg | INTRAMUSCULAR | Status: DC | PRN
  Administered 2012-04-30 (×2): 0.5 mg via INTRAVENOUS

## 2012-04-30 MED ORDER — COLESEVELAM HCL 625 MG PO TABS
1250.0000 mg | ORAL_TABLET | Freq: Every day | ORAL | Status: DC
  Administered 2012-05-01 – 2012-05-02 (×2): 625 mg via ORAL
  Filled 2012-04-30 (×2): qty 2

## 2012-04-30 MED ORDER — DEXTROSE 5 % IV SOLN
500.0000 mg | Freq: Four times a day (QID) | INTRAVENOUS | Status: DC | PRN
  Filled 2012-04-30: qty 5

## 2012-04-30 MED ORDER — DOCUSATE SODIUM 100 MG PO CAPS
100.0000 mg | ORAL_CAPSULE | Freq: Two times a day (BID) | ORAL | Status: DC
  Administered 2012-04-30 – 2012-05-02 (×4): 100 mg via ORAL
  Filled 2012-04-30 (×5): qty 1

## 2012-04-30 MED ORDER — BUPIVACAINE HCL (PF) 0.25 % IJ SOLN
INTRAMUSCULAR | Status: DC | PRN
  Administered 2012-04-30: 30 mL

## 2012-04-30 MED ORDER — OXYCODONE HCL 5 MG PO TABS
5.0000 mg | ORAL_TABLET | ORAL | Status: DC | PRN
  Administered 2012-04-30 – 2012-05-01 (×2): 10 mg via ORAL
  Filled 2012-04-30 (×2): qty 2

## 2012-04-30 MED ORDER — ASPIRIN EC 325 MG PO TBEC
325.0000 mg | DELAYED_RELEASE_TABLET | Freq: Every day | ORAL | Status: DC
  Administered 2012-05-01 – 2012-05-02 (×2): 325 mg via ORAL
  Filled 2012-04-30 (×3): qty 1

## 2012-04-30 MED ORDER — EPHEDRINE SULFATE 50 MG/ML IJ SOLN
INTRAMUSCULAR | Status: DC | PRN
  Administered 2012-04-30: 15 mg via INTRAVENOUS

## 2012-04-30 MED ORDER — ONDANSETRON HCL 4 MG/2ML IJ SOLN
INTRAMUSCULAR | Status: DC | PRN
  Administered 2012-04-30: 4 mg via INTRAVENOUS

## 2012-04-30 MED ORDER — BUPIVACAINE HCL (PF) 0.25 % IJ SOLN
INTRAMUSCULAR | Status: AC
  Filled 2012-04-30: qty 30

## 2012-04-30 MED ORDER — ONDANSETRON HCL 4 MG PO TABS: 4.0000 mg | ORAL_TABLET | Freq: Four times a day (QID) | ORAL | Status: DC | PRN

## 2012-04-30 SURGICAL SUPPLY — 64 items
BANDAGE ESMARK 6X9 LF (GAUZE/BANDAGES/DRESSINGS) ×1 IMPLANT
BASEPLATE TIBIAL CMNT SZ 7 (Orthopedic Implant) ×1 IMPLANT
BLADE SAG 18X100X1.27 (BLADE) ×4 IMPLANT
BNDG ESMARK 6X9 LF (GAUZE/BANDAGES/DRESSINGS) ×2
BOOTCOVER CLEANROOM LRG (PROTECTIVE WEAR) IMPLANT
BOWL SMART MIX CTS (DISPOSABLE) ×2 IMPLANT
CEMENT BONE SIMPLEX SPEEDSET (Cement) ×4 IMPLANT
CLOTH BEACON ORANGE TIMEOUT ST (SAFETY) ×2 IMPLANT
COVER BACK TABLE 24X17X13 BIG (DRAPES) IMPLANT
COVER SURGICAL LIGHT HANDLE (MISCELLANEOUS) ×2 IMPLANT
CUFF TOURNIQUET SINGLE 34IN LL (TOURNIQUET CUFF) ×2 IMPLANT
DRAPE EXTREMITY T 121X128X90 (DRAPE) ×2 IMPLANT
DRAPE PROXIMA HALF (DRAPES) ×2 IMPLANT
DRAPE U-SHAPE 47X51 STRL (DRAPES) ×2 IMPLANT
DRSG PAD ABDOMINAL 8X10 ST (GAUZE/BANDAGES/DRESSINGS) ×2 IMPLANT
DURAPREP 26ML APPLICATOR (WOUND CARE) ×2 IMPLANT
ELECT CAUTERY BLADE 6.4 (BLADE) ×2 IMPLANT
ELECT REM PT RETURN 9FT ADLT (ELECTROSURGICAL) ×2
ELECTRODE REM PT RTRN 9FT ADLT (ELECTROSURGICAL) ×1 IMPLANT
EVACUATOR 1/8 PVC DRAIN (DRAIN) ×2 IMPLANT
FACESHIELD LNG OPTICON STERILE (SAFETY) ×2 IMPLANT
FEMORAL PEG DISTAL FIXATION (Orthopedic Implant) ×2 IMPLANT
FEMORAL POST SZ 6 (Orthopedic Implant) ×2 IMPLANT
GAUZE XEROFORM 5X9 LF (GAUZE/BANDAGES/DRESSINGS) ×2 IMPLANT
GLOVE BIOGEL PI IND STRL 7.0 (GLOVE) ×1 IMPLANT
GLOVE BIOGEL PI IND STRL 8 (GLOVE) ×1 IMPLANT
GLOVE BIOGEL PI INDICATOR 7.0 (GLOVE) ×1
GLOVE BIOGEL PI INDICATOR 8 (GLOVE) ×1
GLOVE ORTHO TXT STRL SZ7.5 (GLOVE) ×4 IMPLANT
GLOVE SURG SS PI 6.5 STRL IVOR (GLOVE) ×2 IMPLANT
GOWN PREVENTION PLUS XLARGE (GOWN DISPOSABLE) ×4 IMPLANT
GOWN STRL NON-REIN LRG LVL3 (GOWN DISPOSABLE) IMPLANT
GOWN STRL REIN 2XL XLG LVL4 (GOWN DISPOSABLE) ×2 IMPLANT
HANDPIECE INTERPULSE COAX TIP (DISPOSABLE) ×1
IMMOBILIZER KNEE 22 UNIV (SOFTGOODS) ×2 IMPLANT
IMMOBILIZER KNEE 24 THIGH 36 (MISCELLANEOUS) IMPLANT
IMMOBILIZER KNEE 24 UNIV (MISCELLANEOUS)
INSERT TIBIAL BEARING SZ 7X11 (Insert) ×2 IMPLANT
KIT BASIN OR (CUSTOM PROCEDURE TRAY) ×2 IMPLANT
KIT ROOM TURNOVER OR (KITS) ×2 IMPLANT
MANIFOLD NEPTUNE II (INSTRUMENTS) ×2 IMPLANT
NS IRRIG 1000ML POUR BTL (IV SOLUTION) ×2 IMPLANT
PACK TOTAL JOINT (CUSTOM PROCEDURE TRAY) ×2 IMPLANT
PAD ARMBOARD 7.5X6 YLW CONV (MISCELLANEOUS) ×2 IMPLANT
PAD CAST 4YDX4 CTTN HI CHSV (CAST SUPPLIES) ×1 IMPLANT
PADDING CAST COTTON 4X4 STRL (CAST SUPPLIES) ×1
PADDING CAST COTTON 6X4 STRL (CAST SUPPLIES) ×2 IMPLANT
PATELLA ASYMMETRIC 38X11 (Knees) ×2 IMPLANT
RUBBERBAND STERILE (MISCELLANEOUS) ×2 IMPLANT
SET HNDPC FAN SPRY TIP SCT (DISPOSABLE) ×1 IMPLANT
SPONGE GAUZE 4X4 12PLY (GAUZE/BANDAGES/DRESSINGS) ×2 IMPLANT
STAPLER VISISTAT 35W (STAPLE) ×2 IMPLANT
SUCTION FRAZIER TIP 10 FR DISP (SUCTIONS) ×2 IMPLANT
SUT VIC AB 1 CTX 36 (SUTURE) ×2
SUT VIC AB 1 CTX36XBRD ANBCTR (SUTURE) ×2 IMPLANT
SUT VIC AB 2-0 CT1 27 (SUTURE) ×2
SUT VIC AB 2-0 CT1 TAPERPNT 27 (SUTURE) ×2 IMPLANT
SYR 30ML LL (SYRINGE) IMPLANT
SYR 30ML SLIP (SYRINGE) ×2 IMPLANT
TIBIAL BASEPLATE SZ 7 (Orthopedic Implant) ×2 IMPLANT
TOWEL OR 17X24 6PK STRL BLUE (TOWEL DISPOSABLE) ×2 IMPLANT
TOWEL OR 17X26 10 PK STRL BLUE (TOWEL DISPOSABLE) ×2 IMPLANT
TRAY FOLEY CATH 14FR (SET/KITS/TRAYS/PACK) ×2 IMPLANT
WATER STERILE IRR 1000ML POUR (IV SOLUTION) ×4 IMPLANT

## 2012-04-30 NOTE — Anesthesia Postprocedure Evaluation (Signed)
Anesthesia Post Note  Patient: Dennis York  Procedure(s) Performed: Procedure(s) (LRB): TOTAL KNEE ARTHROPLASTY (Left)  Anesthesia type: general  Patient location: PACU  Post pain: Pain level controlled  Post assessment: Patient's Cardiovascular Status Stable  Last Vitals:  Filed Vitals:   04/30/12 1230  BP: 119/64  Pulse: 73  Temp:   Resp: 12    Post vital signs: Reviewed and stable  Level of consciousness: sedated  Complications: No apparent anesthesia complications

## 2012-04-30 NOTE — Transfer of Care (Signed)
Immediate Anesthesia Transfer of Care Note  Patient: Dennis York  Procedure(s) Performed: Procedure(s) (LRB) with comments: TOTAL KNEE ARTHROPLASTY (Left) - left total knee arthroplasty  Patient Location: PACU  Anesthesia Type:General  Level of Consciousness: awake, alert , oriented and patient cooperative  Airway & Oxygen Therapy: Patient Spontanous Breathing and Patient connected to nasal cannula oxygen  Post-op Assessment: Report given to PACU RN, Post -op Vital signs reviewed and stable and Patient moving all extremities  Post vital signs: Reviewed and stable  Complications: No apparent anesthesia complications

## 2012-04-30 NOTE — Anesthesia Preprocedure Evaluation (Addendum)
Anesthesia Evaluation  Patient identified by MRN, date of birth, ID band Patient awake    Reviewed: Allergy & Precautions, H&P , NPO status , Patient's Chart, lab work & pertinent test results, reviewed documented beta blocker date and time   History of Anesthesia Complications Negative for: history of anesthetic complications  Airway Mallampati: II TM Distance: >3 FB Neck ROM: Full    Dental  (+) Dental Advisory Given and Poor Dentition   Pulmonary  breath sounds clear to auscultation  Pulmonary exam normal       Cardiovascular hypertension, Pt. on medications and Pt. on home beta blockers + CAD and + Cardiac Stents Rhythm:Regular Rate:Normal     Neuro/Psych Anxiety Depression    GI/Hepatic Neg liver ROS, hiatal hernia, GERD-  Medicated and Controlled,  Endo/Other  negative endocrine ROS  Renal/GU negative Renal ROS     Musculoskeletal   Abdominal   Peds  Hematology   Anesthesia Other Findings   Reproductive/Obstetrics                         Anesthesia Physical Anesthesia Plan  ASA: III  Anesthesia Plan: General   Post-op Pain Management:    Induction: Intravenous  Airway Management Planned: LMA  Additional Equipment:   Intra-op Plan:   Post-operative Plan: Extubation in OR  Informed Consent: I have reviewed the patients History and Physical, chart, labs and discussed the procedure including the risks, benefits and alternatives for the proposed anesthesia with the patient or authorized representative who has indicated his/her understanding and acceptance.   Dental advisory given  Plan Discussed with: CRNA, Anesthesiologist and Surgeon  Anesthesia Plan Comments:         Anesthesia Quick Evaluation

## 2012-04-30 NOTE — Interval H&P Note (Signed)
History and Physical Interval Note:  04/30/2012 8:29 AM  Dennis York  has presented today for surgery, with the diagnosis of DJD Left Knee  The various methods of treatment have been discussed with the patient and family. After consideration of risks, benefits and other options for treatment, the patient has consented to  Procedure(s) (LRB) with comments: TOTAL KNEE ARTHROPLASTY (Left) as a surgical intervention .  The patient's history has been reviewed, patient examined, no change in status, stable for surgery.  I have reviewed the patient's chart and labs.  Questions were answered to the patient's satisfaction.     Mullane,Lether Tesch F

## 2012-04-30 NOTE — Progress Notes (Signed)
Orthopedic Tech Progress Note Patient Details:  Dennis York March 08, 1941 [id number]  CPM Left Knee CPM Left Knee: On Left Knee Flexion (Degrees): 60  Left Knee Extension (Degrees): 0    [name] 04/30/2012, 1:26 PM

## 2012-04-30 NOTE — Preoperative (Signed)
Beta Blockers   Reason not to administer Beta Blockers:Not Applicable 

## 2012-04-30 NOTE — Brief Op Note (Signed)
04/30/2012  2:04 PM  PATIENT:  Dennis York  71 y.o. male  PRE-OPERATIVE DIAGNOSIS:  DJD Left Knee  POST-OPERATIVE DIAGNOSIS:  DJD Left Knee  PROCEDURE:  Procedure(s) (LRB) with comments: TOTAL KNEE ARTHROPLASTY (Left) - left total knee arthroplasty  SURGEON:  Surgeon(s) and Role:    * Loreta Ave, MD - Primary  PHYSICIAN ASSISTANT: Zonia Kief M  ANESTHESIA:   regional and general  EBL:  Total I/O In: 1400 [I.V.:1400] Out: 575 [Urine:500; Blood:75]  BLOOD ADMINISTERED:none SPECIMEN:  No Specimen  DISPOSITION OF SPECIMEN:  N/A  COUNTS:  YES  TOURNIQUET:   Total Tourniquet Time Documented: Thigh (Left) - 63 minutes  PATIENT DISPOSITION:  PACU - hemodynamically stable.

## 2012-04-30 NOTE — Progress Notes (Signed)
UR COMPLETED  

## 2012-04-30 NOTE — Anesthesia Procedure Notes (Addendum)
Anesthesia Regional Block:  Femoral nerve block  Pre-Anesthetic Checklist: ,, timeout performed, Correct Patient, Correct Site, Correct Laterality, Correct Procedure, Correct Position, site marked, Risks and benefits discussed,  Surgical consent,  Pre-op evaluation,  At surgeon's request and post-op pain management  Laterality: Left  Prep: chloraprep       Needles:  Injection technique: Single-shot  Needle Type: Echogenic Stimulator Needle     Needle Length: 5cm 5 cm Needle Gauge: 22 and 22 G    Additional Needles:  Procedures: ultrasound guided (picture in chart) and nerve stimulator Femoral nerve block  Nerve Stimulator or Paresthesia:  Response: quadraceps contraction, 0.45 mA,   Additional Responses:   Narrative:  Start time: 04/30/2012 9:03 AM End time: 04/30/2012 9:13 AM Injection made incrementally with aspirations every 5 mL.  Performed by: Personally  Anesthesiologist: Halford Decamp, MD  Additional Notes: Functioning IV was confirmed and monitors were applied.  A 50mm 22ga Arrow echogenic stimulator needle was used. Sterile prep and drape,hand hygiene and sterile gloves were used. Ultrasound guidance: relevant anatomy identified, needle position confirmed, local anesthetic spread visualized around nerve(s)., vascular puncture avoided.  Image printed for medical record. Negative aspiration and negative test dose prior to incremental administration of local anesthetic. The patient tolerated the procedure well.    Femoral nerve block Procedure Name: LMA Insertion Date/Time: 04/30/2012 10:07 AM Performed by: Jerilee Hoh Pre-anesthesia Checklist: Patient identified, Emergency Drugs available, Patient being monitored and Suction available Patient Re-evaluated:Patient Re-evaluated prior to inductionOxygen Delivery Method: Circle system utilized Preoxygenation: Pre-oxygenation with 100% oxygen Intubation Type: IV induction Ventilation: Mask ventilation without  difficulty LMA: LMA inserted LMA Size: 4.0 Tube type: Oral Number of attempts: 1 Placement Confirmation: positive ETCO2 and breath sounds checked- equal and bilateral Tube secured with: Tape Dental Injury: Teeth and Oropharynx as per pre-operative assessment

## 2012-04-30 NOTE — Progress Notes (Signed)
Orthopedic Tech Progress Note Patient Details:  Dennis York 02/24/1941 [id number]  Patient ID: Dennis York, male   DOB: 06-05-1941, 71 y.o.   MRN: [id number]   Dennis York 04/30/2012, 4:07 PM Trapeze bar

## 2012-05-01 ENCOUNTER — Encounter (HOSPITAL_COMMUNITY): Payer: Self-pay | Admitting: General Practice

## 2012-05-01 LAB — CBC
HCT: 29.9 % — ABNORMAL LOW (ref 39.0–52.0)
MCHC: 35.1 g/dL (ref 30.0–36.0)
MCV: 90.6 fL (ref 78.0–100.0)
RDW: 13.2 % (ref 11.5–15.5)

## 2012-05-01 LAB — BASIC METABOLIC PANEL
BUN: 16 mg/dL (ref 6–23)
Creatinine, Ser: 0.89 mg/dL (ref 0.50–1.35)
GFR calc Af Amer: >90 mL/min (ref >90)
GFR calc non Af Amer: 83 mL/min — ABNORMAL LOW (ref >90)

## 2012-05-01 NOTE — Op Note (Signed)
NAME:  JOVANNE, RIGGENBACH NO.:  [id number]  MEDICAL RECORD NO.:  [id number]  LOCATION:  [id number]                        FACILITY:  [facility]  PHYSICIAN:  Loreta Ave, M.D. DATE OF BIRTH:  1941/01/17  DATE OF PROCEDURE:  04/30/2012 DATE OF DISCHARGE:                              OPERATIVE REPORT   PREOPERATIVE DIAGNOSES:  Left knee end-stage degenerative arthritis. Valgus alignment.  Significant wear of lateral compartment with erosion of the lateral femoral condyle.  Tibial femoral translation.  Reasonable bone stock.  POSTOPERATIVE DIAGNOSES:  Left knee end-stage degenerative arthritis. Valgus alignment.  Significant wear of lateral compartment with erosion of the lateral femoral condyle.  Tibial femoral translation.  Reasonable bone stock.  PROCEDURE:  Left total knee replacement.  Modified minimally invasive Stryker triathlon prosthesis.  Cemented pegged posterior stabilized #6 femoral component.  Cemented #7 tibial component, 11 mm polyethylene insert.  Cemented resurfacing 38 mm patellar component.  Pegged cemented medial offset.  SURGEON:  Loreta Ave, M.D.  ASSISTANT:  Genene Churn. Denton Meek., present throughout the entire case and necessary for timely completion of procedure.  ANESTHESIA:  General.  BLOOD LOSS:  Minimal.  SPECIMENS:  None.  CULTURES:  None.  COMPLICATIONS:  None.  DRESSINGS:  Soft compressive knee immobilizer.  DRAINS:  Hemovac x1.  TOURNIQUET TIME:  About 1 hour.  PROCEDURE:  The patient was brought to operating room, placed on the operating table in supine position.  After adequate anesthesia had been obtained, left knee examined.  Valgus more than 10 degrees, which was correctable.  Bone loss lateral compartment.  Stable ligaments.  Still fairly good extension and flexion.  Tourniquet applied.  Prepped and draped in usual sterile fashion.  Exsanguinated with elevation, Esmarch. Tourniquet inflated to 350 mmHg.   Straight incision above the patella down to tibial tubercle.  Hemostasis with cautery.  Medial arthrotomy, vastus splitting, preserving quad tendon.  Knee exposed.  Grade 4 changes throughout.  Limited medial capsule release.  Remnants of menisci, cruciate ligaments, periarticular spurs removed. Intramedullary guide distal femur, 5 degrees of valgus.  8 mm resection. Using epicondylar axis, the femur was sized, cut, and fitted for a pegged posterior stabilized #6 component.  Proximal tibial resection. Extramedullary guide.  A 3-degree posterior slope cut.  Size #7 component.  Nicely balanced in flexion/extension.  Debris cleared throughout.  Patella exposed.  Posterior 11 mm removed.  Drilled, sized, and fitted for a 38-mm component.  Trials put in place.  #6 on the femur, #7 on the tibia, 11 mm insert and 38 patella.  With this construct, nicely balanced knee in flexion/extension.  Good biomechanical axis.  Excellent patellofemoral tracking.  Tibia was marked for rotation and reamed.  All trials removed.  Copious irrigation with pulse irrigating device.  Cement prepared, placed on all components, firmly seated.  Polyethylene attached to tibia, knee reduced.  Patella held in place with a clamp.  Once the cement hardened, the knee was reexamined.  Hemovac was placed and brought out through a separate stab wound.  Arthrotomy closed with #1 Vicryl.  Skin and subcutaneous tissue with Vicryl and staples.  Sterile compressive dressing applied.  Tourniquet deflated and removed.  Knee immobilizer applied.  Anesthesia reversed.  Brought to recovery room.  Tolerated surgery well.  No complications.     Loreta Ave, M.D.     DFM/MEDQ  D:  04/30/2012  T:  05/01/2012  Job:  [id number]

## 2012-05-01 NOTE — Evaluation (Signed)
Physical Therapy Evaluation Patient Details Name: Dennis York MRN: [id number] DOB: 25-Aug-1940 Today's Date: 05/01/2012 Time: 1119-1208 PT Time Calculation (min): 49 min  PT Assessment / Plan / Recommendation Clinical Impression  Pt is a 71 y/o male s/p L TKA.  Pt and spouse had lots of questions about pt's bilateral foot deformity affecting the TKA. Attempted to answer as many of pt's questions as possible.  Acute PT to follow pt to progress functional mobility for safe D/C home tomorrow.  Will provide. pt with HEP for L Knee and a few exercise for bilateral anterior tibialis strengthening .      PT Assessment  Patient needs continued PT services    Follow Up Recommendations  Home health PT    Does the patient have the potential to tolerate intense rehabilitation      Barriers to Discharge None      Equipment Recommendations  None recommended by PT;None recommended by OT    Recommendations for Other Services     Frequency 7X/week    Precautions / Restrictions Precautions Precautions: Knee Required Braces or Orthoses: Knee Immobilizer - Left Restrictions LLE Weight Bearing: Weight bearing as tolerated   Pertinent Vitals/Pain No pain in L knee. Pt would like to try tylenol instead of Narcotic Notified RN.       Mobility  Bed Mobility Bed Mobility: Supine to Sit;Sitting - Scoot to Edge of Bed Supine to Sit: 6: Modified independent (Device/Increase time);HOB flat Sitting - Scoot to Edge of Bed: 6: Modified independent (Device/Increase time) Transfers Transfers: Sit to Stand;Stand to Sit Sit to Stand: 4: Min guard;With upper extremity assist;From bed Stand to Sit: 4: Min guard;With upper extremity assist;To chair/3-in-1 Details for Transfer Assistance: vc for use of walker and safe transfer techniques Ambulation/Gait Ambulation/Gait Assistance: 5: Supervision Ambulation Distance (Feet): 80 Feet Assistive device: Rolling walker Ambulation/Gait Assistance Details:  Instructed pt in step to gait initially.  Pt doing well so progressed to reciprocal gait.  Gait Pattern: Step-to pattern;Step-through pattern Stairs: No    Shoulder Instructions     Exercises Total Joint Exercises Ankle Circles/Pumps: 10 reps;AROM;Both   PT Diagnosis: Abnormality of gait;Generalized weakness;Acute pain  PT Problem List: Decreased strength;Decreased activity tolerance;Decreased mobility;Decreased knowledge of use of DME;Decreased knowledge of precautions;Obesity;Pain;Decreased range of motion PT Treatment Interventions: Gait training;DME instruction;Stair training;Therapeutic activities;Functional mobility training;Therapeutic exercise;Patient/family education   PT Goals Acute Rehab PT Goals PT Goal Formulation: With patient Time For Goal Achievement: 05/08/12 Potential to Achieve Goals: Good Pt will go Supine/Side to Sit: Independently PT Goal: Supine/Side to Sit - Progress: Goal set today Pt will Transfer Bed to Chair/Chair to Bed: Independently PT Transfer Goal: Bed to Chair/Chair to Bed - Progress: Goal set today Pt will Ambulate: >150 feet;with modified independence;with rolling walker PT Goal: Ambulate - Progress: Goal set today Pt will Go Up / Down Stairs: 1-2 stairs;with supervision;with least restrictive assistive device PT Goal: Up/Down Stairs - Progress: Goal set today Pt will Perform Home Exercise Program: Independently PT Goal: Perform Home Exercise Program - Progress: Goal set today  Visit Information  Last PT Received On: 05/01/12    Subjective Data  Subjective: Agree to PT eval Patient Stated Goal: Walk without pain   Prior Functioning  Home Living Lives With: Spouse Available Help at Discharge: Family Type of Home: House Home Access: Stairs to enter [employer] of Steps: 3 single steps Home Layout: One level Bathroom Shower/Tub: Engineer, manufacturing systems: Handicapped height Bathroom Accessibility: Yes How Accessible:  Accessible via walker  Home Adaptive Equipment: Tub transfer bench;Walker - rolling;Straight cane;Bedside commode/3-in-1 Prior Function Level of Independence: Independent;Independent with assistive device(s) (occasionally used cane) Able to Take Stairs?: Yes Driving: Yes Vocation: Part time employment Communication Communication: No difficulties Dominant Hand: Right    Cognition  Overall Cognitive Status: Appears within functional limits for tasks assessed/performed Arousal/Alertness: Awake/alert Orientation Level: Appears intact for tasks assessed Behavior During Session: [facility] for tasks performed    Extremity/Trunk Assessment Right Upper Extremity Assessment RUE ROM/Strength/Tone: Within functional levels (c/o hand/wrist pain) RUE Sensation: WFL - Light Touch Left Upper Extremity Assessment LUE ROM/Strength/Tone: Within functional levels (c/o hand/wrist pain) Right Lower Extremity Assessment RLE ROM/Strength/Tone: WFL for tasks assessed (c/o pain in ankle) Left Lower Extremity Assessment LLE ROM/Strength/Tone: Deficits Trunk Assessment Trunk Assessment: Normal   Balance    End of Session PT - End of Session Equipment Utilized During Treatment: Gait belt;Left knee immobilizer Activity Tolerance: Patient tolerated treatment well Patient left: in bed;with call bell/phone within reach;in CPM;with family/visitor present CPM Left Knee CPM Left Knee: On Left Knee Flexion (Degrees): 70  Left Knee Extension (Degrees): 0   GP     Taquan Bralley 05/01/2012, 1:30 PM  Messina Kosinski L. Naisha Wisdom DPT [phone]

## 2012-05-01 NOTE — Progress Notes (Signed)
Subjective:  Doing well pain controlled.     Objective: Vital signs in last 24 hours: Temp:  [97.2 F (36.2 C)-99.2 F (37.3 C)] 99.2 F (37.3 C) (10/31 0655) Pulse Rate:  [70-92] 80  (10/31 0655) Resp:  [11-19] 18  (10/31 0655) BP: (115-154)/(56-75) 154/68 mmHg (10/31 0655) SpO2:  [94 %-100 %] 99 % (10/31 0655)  Intake/Output from previous day: 10/30 0701 - 10/31 0700 In: 1868 [I.V.:1868] Out: 3025 [Urine:2750; Drains:200; Blood:75] Intake/Output this shift: Total I/O In: 240 [P.O.:240] Out: -    Basename 05/01/12 0540  HGB 10.5*    Basename 05/01/12 0540  WBC 14.7*  RBC 3.30*  HCT 29.9*  PLT 233    Basename 05/01/12 0540  NA 133*  K 4.2  CL 102  CO2 22  BUN 16  CREATININE 0.89  GLUCOSE 163*  CALCIUM 8.5   No results found for this basename: LABPT:2,INR:2 in the last 72 hours  Exam:  Dressing c/d/i. Calf nt, nvi.    Assessment/Plan: Saline lock iv.  D/c dilaudid.  Anticipate d/c home tomorrow.   Dennis York M 05/01/2012, 11:47 AM

## 2012-05-01 NOTE — Progress Notes (Signed)
Occupational Therapy Evaluation Patient Details Name: Dennis York MRN: [id number] DOB: [birth year] Today's Date: 05/01/2012 Time: 3086-5784 OT Time Calculation (min): 40 min  OT Assessment / Plan / Recommendation Clinical Impression  71 yo talkative pt s/p L TKA. Pt will have 24/7 S at home after D/C. Pt will benefit from [facility] after D/C. Pt feeling "queasy" this am, but did well with mobility. Pt will benefit from skilled OT services to max independence with ADL and functional moiblity for ADL to facilitate D/C home. Pt will most likely be able to D/C home tomorrow.     OT Assessment  Patient needs continued OT Services    Follow Up Recommendations  Home health OT    Barriers to Discharge None    Equipment Recommendations  None recommended by OT    Recommendations for Other Services  follow up with neuro outpatient clinic with OT for education and treatment of arthritis (hands/wrist) when appropriate  Frequency  Min 2X/week    Precautions / Restrictions Precautions Precautions: Knee Required Braces or Orthoses: Knee Immobilizer - Left Restrictions LLE Weight Bearing: Weight bearing as tolerated   Pertinent Vitals/Pain none    ADL  Grooming: Modified independent Upper Body Bathing: Set up Lower Body Bathing: Minimal assistance Upper Body Dressing: Set up Lower Body Dressing: Minimal assistance Toilet Transfer: Min guard Toilet Transfer Equipment: Other (comment) (bed - chair - foley just removed) Equipment Used: Gait belt;Knee Immobilizer;Rolling walker Transfers/Ambulation Related to ADLs: minguard ADL Comments: Min A for LB ADL. Will benefit from joint preservation techniques    OT Diagnosis: Generalized weakness;Acute pain  OT Problem List: Decreased strength;Decreased range of motion;Decreased knowledge of use of DME or AE;Decreased knowledge of precautions;Pain OT Treatment Interventions: Self-care/ADL training;DME and/or AE instruction;Therapeutic  activities;Patient/family education;Other (comment) (joint protection)   OT Goals Acute Rehab OT Goals OT Goal Formulation: With patient (eval only) Time For Goal Achievement: 05/08/12 Potential to Achieve Goals: Good ADL Goals Pt Will Perform Lower Body Bathing: with supervision;Sit to stand from chair;with adaptive equipment;with cueing (comment type and amount);Unsupported ADL Goal: Lower Body Bathing - Progress: Goal set today Pt Will Perform Lower Body Dressing: with supervision;Sit to stand from chair;Unsupported;with adaptive equipment;with cueing (comment type and amount) ADL Goal: Lower Body Dressing - Progress: Goal set today Pt Will Transfer to Toilet: with supervision;with DME;3-in-1;Ambulation ADL Goal: Toilet Transfer - Progress: Goal set today Pt Will Perform Toileting - Clothing Manipulation: with modified independence;Standing ADL Goal: Toileting - Clothing Manipulation - Progress: Goal set today Pt Will Perform Toileting - Hygiene: with modified independence;Standing at 3-in-1/toilet;Sit to stand from 3-in-1/toilet ADL Goal: Toileting - Hygiene - Progress: Goal set today Pt Will Perform Tub/Shower Transfer: with supervision;with caregiver independent in assisting;Ambulation;Transfer tub bench ADL Goal: Tub/Shower Transfer - Progress: Goal set today  Visit Information  Last OT Received On: 05/01/12    Subjective Data   "why do my hands hurt?"   Prior Functioning     Home Living Lives With: Spouse Available Help at Discharge: Family Type of Home: House Home Access: Stairs to enter [employer] of Steps: 3 single steps Home Layout: One level Bathroom Shower/Tub: Engineer, manufacturing systems: Handicapped height Bathroom Accessibility: Yes How Accessible: Accessible via walker Home Adaptive Equipment: Tub transfer bench;Walker - rolling;Straight cane;Bedside commode/3-in-1 Prior Function Level of Independence: Independent;Independent with  assistive device(s) (occasionally used cane) Able to Take Stairs?: Yes Driving: Yes Vocation: Part time employment Communication Communication: No difficulties Dominant Hand: Right         Vision/Perception  WFL   Cognition  Overall Cognitive Status: Appears within functional limits for tasks assessed/performed Arousal/Alertness: Awake/alert Orientation Level: Appears intact for tasks assessed Behavior During Session: [facility] for tasks performed    Extremity/Trunk Assessment Right Upper Extremity Assessment RUE ROM/Strength/Tone: Within functional levels (c/o hand/wrist pain) RUE Sensation: WFL - Light Touch Left Upper Extremity Assessment LUE ROM/Strength/Tone: Within functional levels (c/o hand/wrist pain) Right Lower Extremity Assessment RLE ROM/Strength/Tone: WFL for tasks assessed (c/o pain in ankle) Left Lower Extremity Assessment LLE ROM/Strength/Tone: Deficits Trunk Assessment Trunk Assessment: Normal     Mobility Bed Mobility Bed Mobility: Supine to Sit;Sitting - Scoot to Edge of Bed Supine to Sit: 6: Modified independent (Device/Increase time);HOB flat Sitting - Scoot to Edge of Bed: 6: Modified independent (Device/Increase time) Transfers Transfers: Sit to Stand;Stand to Sit Sit to Stand: 4: Min guard;With upper extremity assist;From bed Stand to Sit: 4: Min guard;With upper extremity assist;To chair/3-in-1 Details for Transfer Assistance: vc for use of walker and safe transfer techniques     Shoulder Instructions     Exercise     Balance  WFL   End of Session OT - End of Session Equipment Utilized During Treatment: Gait belt Activity Tolerance: Patient tolerated treatment well Patient left: in chair;with call bell/phone within reach;with family/visitor present Nurse Communication: Mobility status;Precautions;Weight bearing status  GO     Dennis York,HILLARY 05/01/2012, 9:41 AM Luisa Dago, OTR/L  [phone] 05/01/2012

## 2012-05-01 NOTE — Progress Notes (Addendum)
CARE MANAGEMENT NOTE 05/01/2012  Patient:  Dennis York,Dennis York   Account Number:  [id number]  Date Initiated:  05/01/2012  Documentation initiated by:  Vance Peper  Subjective/Objective Assessment:   71 yr old male s/p left total knee arthroplasty.     Action/Plan:   CM spoke with patient and wife concerning home halth and DME needs.  Patient was preoperatively setup with [facility], no changes. He has rolling walker and 3in1, CPM has been delivered. Family support.   Anticipated DC Date:  05/02/2012   Anticipated DC Plan:  HOME W HOME HEALTH SERVICES      DC Planning Services  CM consult      [facility] Choice  HOME HEALTH   Choice offered to / List presented to:  C-1 Patient        HH arranged  HH-2 PT    HH-3 OT Status of service:  Completed, signed off Medicare Important Message given?   (If response is "NO", the following Medicare IM given date fields will be blank) Date Medicare IM given:   Date Additional Medicare IM given:    Discharge Disposition:  HOME W HOME HEALTH SERVICES  Per UR Regulation:    If discussed at Long Length of Stay Meetings, dates discussed:    Comments:  05/01/12 10:45 Vance Peper, RN BSN Case Manager Patient is requesting to have the same therapist that came out with his previous surger. CM spoke with Dennis York liasion to convey this request.

## 2012-05-01 NOTE — Progress Notes (Signed)
Physical Therapy Treatment Patient Details Name: Dennis York MRN: [id number] DOB: 1940-07-15 Today's Date: 05/01/2012 Time: 7829-5621 PT Time Calculation (min): 29 min  PT Assessment / Plan / Recommendation Comments on Treatment Session  Pt doing very well.     Follow Up Recommendations  Home health PT     Does the patient have the potential to tolerate intense rehabilitation     Barriers to Discharge        Equipment Recommendations  None recommended by PT;None recommended by OT    Recommendations for Other Services    Frequency 7X/week   Plan Discharge plan remains appropriate;Frequency remains appropriate    Precautions / Restrictions Precautions Precautions: Knee Restrictions LLE Weight Bearing: Weight bearing as tolerated   Pertinent Vitals/Pain No c/o pain    Mobility  Bed Mobility Bed Mobility: Supine to Sit;Sitting - Scoot to Edge of Bed Supine to Sit: 6: Modified independent (Device/Increase time);HOB flat Sitting - Scoot to Edge of Bed: 6: Modified independent (Device/Increase time) Transfers Transfers: Sit to Stand;Stand to Sit Sit to Stand: 5: Supervision Stand to Sit: 5: Supervision Details for Transfer Assistance: Verbal cues for hand and LLE placement.  Ambulation/Gait Ambulation/Gait Assistance: 5: Supervision Ambulation Distance (Feet): 200 Feet Assistive device: Rolling walker Ambulation/Gait Assistance Details: Cues for reciprocal gait.  2 standing rest breaks.  Gait Pattern: Step-through pattern Stairs: Yes Stairs Assistance: 5: Supervision Stairs Assistance Details (indicate cue type and reason): Instructed pt in sequencing  Stair Management Technique: One rail Right;Forwards;Step to pattern Number of Stairs: 3  Wheelchair Mobility Wheelchair Mobility: No    Exercises     PT Diagnosis:    PT Problem List:   PT Treatment Interventions:     PT Goals Acute Rehab PT Goals PT Goal Formulation: With patient Time For Goal Achievement:  05/08/12 Potential to Achieve Goals: Good Pt will go Supine/Side to Sit: Independently PT Goal: Supine/Side to Sit - Progress: Progressing toward goal Pt will Transfer Bed to Chair/Chair to Bed: Independently PT Transfer Goal: Bed to Chair/Chair to Bed - Progress: Progressing toward goal Pt will Ambulate: >150 feet;with modified independence;with rolling walker PT Goal: Ambulate - Progress: Progressing toward goal Pt will Go Up / Down Stairs: 1-2 stairs;with supervision;with least restrictive assistive device PT Goal: Up/Down Stairs - Progress: Progressing toward goal Pt will Perform Home Exercise Program: Independently  Visit Information  Last PT Received On: 05/01/12    Subjective Data      Cognition  Overall Cognitive Status: Appears within functional limits for tasks assessed/performed Arousal/Alertness: Awake/alert Orientation Level: Appears intact for tasks assessed Behavior During Session: [facility] for tasks performed    Balance     End of Session PT - End of Session Equipment Utilized During Treatment: Gait belt;Left knee immobilizer Activity Tolerance: Patient tolerated treatment well Patient left: in chair;with call bell/phone within reach CPM Left Knee CPM Left Knee: Off Additional Comments: completed 2 hours of CPM Time   GP     Dennis York 05/01/2012, 5:25 PM Dennis York L. Dennis York DPT [phone]

## 2012-05-02 LAB — BASIC METABOLIC PANEL
BUN: 14 mg/dL (ref 6–23)
GFR calc Af Amer: >90 mL/min (ref >90)
GFR calc non Af Amer: 81 mL/min — ABNORMAL LOW (ref >90)
Potassium: 3.6 mEq/L (ref 3.5–5.1)
Sodium: 137 mEq/L (ref 135–145)

## 2012-05-02 LAB — CBC
MCHC: 33.8 g/dL (ref 30.0–36.0)
RDW: 13.8 % (ref 11.5–15.5)

## 2012-05-02 MED ORDER — ASPIRIN 325 MG PO TBEC: 325.0000 mg | DELAYED_RELEASE_TABLET | Freq: Every day | ORAL | Status: DC

## 2012-05-02 MED ORDER — TAPENTADOL HCL 50 MG PO TABS: 75.0000 mg | ORAL_TABLET | Freq: Four times a day (QID) | ORAL | Status: DC | PRN

## 2012-05-02 MED ORDER — TAPENTADOL HCL 75 MG PO TABS: 75.0000 mg | ORAL_TABLET | Freq: Four times a day (QID) | ORAL | Status: DC | PRN

## 2012-05-02 MED ORDER — METHOCARBAMOL 500 MG PO TABS: 500.0000 mg | ORAL_TABLET | Freq: Four times a day (QID) | ORAL | Status: DC | PRN

## 2012-05-02 NOTE — Progress Notes (Signed)
Occupational Therapy Treatment Patient Details Name: Joshwa Hemric MRN: [id number] DOB: 1941/01/14 Today's Date: 05/02/2012 Time: 4098-1191 OT Time Calculation (min): 30 min  OT Assessment / Plan / Recommendation Comments on Treatment Session Pt is doing very well this session, completes BADL's, transfers and ambulation at supervision level. Does not require any AE to (A) with LB bathing/dressing.     Follow Up Recommendations  Home health OT    Barriers to Discharge       Equipment Recommendations  None recommended by PT;None recommended by OT    Recommendations for Other Services    Frequency Min 2X/week   Plan Discharge plan remains appropriate    Precautions / Restrictions Precautions Precautions: Knee Required Braces or Orthoses: Knee Immobilizer - Left Knee Immobilizer - Left: On except when in CPM Restrictions Weight Bearing Restrictions: Yes LLE Weight Bearing: Weight bearing as tolerated   Pertinent Vitals/Pain No numeric score given, pt states some pain with activity    ADL  Lower Body Bathing: Simulated;Supervision/safety Where Assessed - Lower Body Bathing: Supported sit to stand Upper Body Dressing: Performed;Supervision/safety Where Assessed - Upper Body Dressing: Unsupported sitting Lower Body Dressing: Performed;Supervision/safety Where Assessed - Lower Body Dressing: Unsupported sit to stand Toilet Transfer: Buyer, retail Method: Sit to Barista: Other (comment) (bed to chair ) Tub/Shower Transfer: Performed;Supervision/safety Tub/Shower Transfer Method: Science writer: Counsellor Used: Knee Immobilizer;Rolling walker Transfers/Ambulation Related to ADLs: supervision level  ADL Comments: Pt. able to dress UB/LB at supervision level, did not require use of AE to complete. Therapist pushed pt in recliner down to ortho gym and completed tub bench transfer at  supervision level. Pt is supervision for ambulation and transfers.     OT Diagnosis:    OT Problem List:   OT Treatment Interventions:     OT Goals Acute Rehab OT Goals OT Goal Formulation: With patient Time For Goal Achievement: 05/08/12 Potential to Achieve Goals: Good ADL Goals Pt Will Perform Lower Body Bathing: with supervision;Sit to stand from chair;with adaptive equipment;with cueing (comment type and amount);Unsupported ADL Goal: Lower Body Bathing - Progress: Met Pt Will Perform Lower Body Dressing: with supervision;Sit to stand from chair;Unsupported;with adaptive equipment;with cueing (comment type and amount) ADL Goal: Lower Body Dressing - Progress: Met Pt Will Transfer to Toilet: with supervision;with DME;3-in-1;Ambulation ADL Goal: Toilet Transfer - Progress: Met Pt Will Perform Toileting - Clothing Manipulation: with modified independence;Standing Pt Will Perform Toileting - Hygiene: with modified independence;Standing at 3-in-1/toilet;Sit to stand from 3-in-1/toilet Pt Will Perform Tub/Shower Transfer: with supervision;with caregiver independent in assisting;Ambulation;Transfer tub bench ADL Goal: Tub/Shower Transfer - Progress: Met  Visit Information  Last OT Received On: 05/02/12 Assistance Needed: +1    Subjective Data      Prior Functioning       Cognition  Overall Cognitive Status: Appears within functional limits for tasks assessed/performed Arousal/Alertness: Awake/alert Orientation Level: Appears intact for tasks assessed Behavior During Session: [facility] for tasks performed    Mobility  Shoulder Instructions Bed Mobility Bed Mobility: Supine to Sit;Sitting - Scoot to Edge of Bed;Sit to Supine Supine to Sit: 7: Independent;HOB flat Sitting - Scoot to Edge of Bed: 7: Independent Sit to Supine: 7: Independent;HOB flat Details for Bed Mobility Assistance: Requires no physical (A)  Transfers Transfers: Sit to Stand;Stand to Sit Sit to Stand: 5:  Supervision;With upper extremity assist;From bed;From chair/3-in-1 Stand to Sit: 5: Supervision;With upper extremity assist;To chair/3-in-1;To bed Details for Transfer Assistance: good placement of hands  during transfers this session             End of Session OT - End of Session Equipment Utilized During Treatment: Left knee immobilizer Activity Tolerance: Patient tolerated treatment well Patient left: in chair;with call bell/phone within reach;with family/visitor present Nurse Communication: Mobility status  I agree with the treatment session above. Ignacia Palma, OTR/L [phone] 05/02/2012          Cleora Fleet 05/02/2012, 11:57 AM

## 2012-05-02 NOTE — Progress Notes (Signed)
Physical Therapy Treatment Patient Details Name: Dennis York MRN: [id number] DOB: Nov 16, 1940 Today's Date: 05/02/2012 Time: 6213-0865 PT Time Calculation (min): 55 min  PT Assessment / Plan / Recommendation Comments on Treatment Session  Pt appropriate to d/c to home today.     Follow Up Recommendations  Home health PT     Does the patient have the potential to tolerate intense rehabilitation     Barriers to Discharge        Equipment Recommendations  None recommended by PT;None recommended by OT    Recommendations for Other Services    Frequency 7X/week   Plan Discharge plan remains appropriate;Frequency remains appropriate    Precautions / Restrictions Precautions Precautions: Knee Required Braces or Orthoses: Knee Immobilizer - Left Knee Immobilizer - Left: On except when in CPM Restrictions Weight Bearing Restrictions: Yes LLE Weight Bearing: Weight bearing as tolerated   Pertinent Vitals/Pain Pt c/o pain in lateral side of L knee 5-6/10. Pt also c/o pain in L anterior quadraceps muscles 5/10  Repositioned L LE and applied ice for pain relief.      Mobility  Bed Mobility Bed Mobility: Supine to Sit;Sitting - Scoot to Edge of Bed;Sit to Supine Supine to Sit: 7: Independent;HOB flat Sitting - Scoot to Edge of Bed: 7: Independent Sit to Supine: 7: Independent;HOB flat Details for Bed Mobility Assistance: Pt managing LLE independently with and without KI Transfers Transfers: Sit to Stand;Stand to Sit Sit to Stand: 6: Modified independent (Device/Increase time);From bed;From chair/3-in-1;With upper extremity assist Stand to Sit: 5: Supervision Details for Transfer Assistance: Verbal cues for hand placement when transitioning from stand to sit.  No instruction required for sit to stand.  Ambulation/Gait Ambulation/Gait Assistance: 6: Modified independent (Device/Increase time) Ambulation Distance (Feet): 200 Feet Assistive device: Rolling walker Ambulation/Gait  Assistance Details: pt reverted to step to pattern secondary to increased pain in knee today.   Gait Pattern: Step-to pattern Gait velocity: WFL Stairs: No    Exercises Total Joint Exercises Ankle Circles/Pumps: 10 reps;AROM;Both Quad Sets: Left;10 reps Short Arc Quad: Left;10 reps Heel Slides: Left;10 reps Hip ABduction/ADduction: Both;10 reps Straight Leg Raises: Left;10 reps;AROM;Strengthening Long Arc Quad: Left;10 reps Knee Flexion: Left;Other reps (comment);Seated (10 second hold x 3. ) Goniometric ROM: 0-88 degrees AAROM    PT Diagnosis:    PT Problem List:   PT Treatment Interventions:     PT Goals Acute Rehab PT Goals PT Goal Formulation: With patient Time For Goal Achievement: 05/08/12 Potential to Achieve Goals: Good Pt will go Supine/Side to Sit: Independently PT Goal: Supine/Side to Sit - Progress: Met Pt will Transfer Bed to Chair/Chair to Bed: Independently PT Transfer Goal: Bed to Chair/Chair to Bed - Progress: Partly met Pt will Ambulate: >150 feet;with modified independence;with rolling walker PT Goal: Ambulate - Progress: Met Pt will Go Up / Down Stairs: 1-2 stairs;with supervision;with least restrictive assistive device PT Goal: Up/Down Stairs - Progress: Met Pt will Perform Home Exercise Program: Independently PT Goal: Perform Home Exercise Program - Progress: Progressing toward goal  Visit Information       Subjective Data  Subjective: I can go home today Patient Stated Goal: Walk without pain   Cognition  Overall Cognitive Status: Appears within functional limits for tasks assessed/performed Arousal/Alertness: Awake/alert Orientation Level: Appears intact for tasks assessed Behavior During Session: [facility] for tasks performed    Balance     End of Session PT - End of Session Equipment Utilized During Treatment: Gait belt;Left knee immobilizer Activity Tolerance: Patient  tolerated treatment well Patient left: in chair;with call bell/phone within  reach Nurse Communication: Mobility status CPM Left Knee CPM Left Knee: Off Left Knee Flexion (Degrees): 50  Left Knee Extension (Degrees): 0  Additional Comments: completed 2 hours of CPM Time   GP     Dennis York 05/02/2012, 10:53 AM Theron Arista L. Rodrecus Belsky DPT [phone]

## 2012-05-02 NOTE — Progress Notes (Signed)
Dennis York discharged Home per MD order.  Discharge instructions reviewed and discussed with the patient, all questions and concerns answered. Copy of instructions and scripts given to patient.   Sharod, Petsch  Home Medication Instructions [id number]   Printed on:05/02/12 1857  Medication Information                    amLODipine (NORVASC) 5 MG tablet Take 1 tablet (5 mg total) by mouth daily. For control of high blood pressure           metoprolol tartrate (LOPRESSOR) 25 MG tablet Take 0.5 tablets (12.5 mg total) by mouth 2 (two) times daily. For control of high blood pressure           omeprazole (PRILOSEC OTC) 20 MG tablet Take 1 tablet (20 mg total) by mouth daily. For control of stomach acid secretion and helps GERD.           colesevelam (WELCHOL) 625 MG tablet Take 1.2 mg by mouth daily.           aspirin 325 MG EC tablet Take 1 tablet (325 mg total) by mouth daily with breakfast.           methocarbamol (ROBAXIN) 500 MG tablet Take 1 tablet (500 mg total) by mouth every 6 (six) hours as needed (spasms).           Tapentadol HCl (NUCYNTA) 75 MG TABS Take 1 tablet (75 mg total) by mouth every 6 (six) hours as needed (pain).             Patients skin is clean, dry and intact, no evidence of skin break down. IV site discontinued and catheter remains intact. Site without signs and symptoms of complications. Dressing and pressure applied.  Patient escorted to car by nursing student and nurse in a wheelchair,  no distress noted upon discharge.  [name] 05/02/2012 6:57 PM

## 2012-05-02 NOTE — Progress Notes (Signed)
Physical Therapy Treatment Patient Details Name: Dennis York MRN: [id number] DOB: 09/11/1940 Today's Date: 05/02/2012 Time: 1200-1230 PT Time Calculation (min): 30 min  PT Assessment / Plan / Recommendation Comments on Treatment Session  Pt appropriate to d/c to home today.     Follow Up Recommendations  Home health PT     Does the patient have the potential to tolerate intense rehabilitation     Barriers to Discharge        Equipment Recommendations  None recommended by PT;None recommended by OT    Recommendations for Other Services    Frequency 7X/week   Plan Discharge plan remains appropriate;Frequency remains appropriate    Precautions / Restrictions Precautions Precautions: Knee Required Braces or Orthoses: Knee Immobilizer - Left Knee Immobilizer - Left: On except when in CPM Restrictions Weight Bearing Restrictions: Yes LLE Weight Bearing: Weight bearing as tolerated   Pertinent Vitals/Pain Pain in L thigh and lateral knee improved but still present. Applied Ice to knee.      Mobility  Bed Mobility Bed Mobility: Not assessed Supine to Sit: 7: Independent;HOB flat Sitting - Scoot to Edge of Bed: 7: Independent Sit to Supine: 7: Independent;HOB flat Details for Bed Mobility Assistance: Requires no physical (A)  Transfers Transfers: Not assessed Sit to Stand: 5: Supervision;With upper extremity assist;From bed;From chair/3-in-1 Stand to Sit: 5: Supervision;With upper extremity assist;To chair/3-in-1;To bed Details for Transfer Assistance: good placement of hands during transfers this session Ambulation/Gait Ambulation/Gait Assistance: Not tested (comment)    Exercises Total Joint Exercises Ankle Circles/Pumps: 10 reps;AROM;Both Quad Sets: Left;10 reps Short Arc Quad: Left;10 reps Heel Slides: Left;10 reps Hip ABduction/ADduction: Both;10 reps Straight Leg Raises: Left;10 reps;AROM;Strengthening Long Arc Quad: Left;10 reps Knee Flexion: Left;Other reps  (comment);Seated (10 second hold x 3. ) Other Exercises Other Exercises: Ankle DF with Orange T band 10 Other Exercises: Ankle inversion with orang T band resistance. 10 x 1 bilaterally Other Exercises: Calf strech with towel in supine. 30 seconds x 1 bilaterally.    PT Diagnosis:    PT Problem List:   PT Treatment Interventions:     PT Goals Acute Rehab PT Goals PT Goal Formulation: With patient Time For Goal Achievement: 05/08/12 Potential to Achieve Goals: Good Pt will Perform Home Exercise Program: Independently PT Goal: Perform Home Exercise Program - Progress: Met  Visit Information  Last PT Received On: 05/02/12 Assistance Needed: +1    Subjective Data      Cognition  Overall Cognitive Status: Appears within functional limits for tasks assessed/performed Arousal/Alertness: Awake/alert Orientation Level: Appears intact for tasks assessed Behavior During Session: [facility] for tasks performed    Balance     End of Session PT - End of Session Equipment Utilized During Treatment: Gait belt;Left knee immobilizer Activity Tolerance: Patient tolerated treatment well Patient left: in bed Nurse Communication: Mobility status   GP     Thoren Hosang 05/02/2012, 2:30 PM Aanyah Loa L. Jakayla Schweppe DPT [phone]

## 2012-05-02 NOTE — Plan of Care (Signed)
Problem: Phase III Progression Outcomes Goal: Discharge plan remains appropriate-arrangements made Outcome: Completed/Met Date Met:  05/02/12 Pt going home with home health.

## 2012-05-02 NOTE — Progress Notes (Signed)
Subjective: Doing well.  No complaints.  Wants to go home today.   Objective: Vital signs in last 24 hours: Temp:  [97.7 F (36.5 C)-98.3 F (36.8 C)] 98.2 F (36.8 C) (11/01 0725) Pulse Rate:  [65-81] 65  (11/01 1107) Resp:  [14-18] 15  (11/01 1115) BP: (111-136)/(53-64) 131/64 mmHg (11/01 1107) SpO2:  [97 %-98 %] 98 % (11/01 1115) Weight:  [88.905 kg (196 lb)] 88.905 kg (196 lb) (11/01 1100)  Intake/Output from previous day: 10/31 0701 - 11/01 0700 In: 880 [P.O.:480; I.V.:200; IV Piggyback:200] Out: 2550 [Urine:2450; Drains:100] Intake/Output this shift: Total I/O In: -  Out: 550 [Urine:500; Drains:50]   Basename 05/02/12 0650 05/01/12 0540  HGB 9.6* 10.5*    Basename 05/02/12 0650 05/01/12 0540  WBC 13.1* 14.7*  RBC 3.07* 3.30*  HCT 28.4* 29.9*  PLT 213 233    Basename 05/02/12 0650 05/01/12 0540  NA 137 133*  K 3.6 4.2  CL 102 102  CO2 25 22  BUN 14 16  CREATININE 0.93 0.89  GLUCOSE 130* 163*  CALCIUM 8.8 8.5   No results found for this basename: LABPT:2,INR:2 in the last 72 hours  Exam:  Wound looks good. Staples intact.  No signs of infection.  Calf nt, nvi.  Drain removed.  Assessment/Plan: D/c home today.   Dennis York M 05/02/2012, 12:50 PM

## 2012-05-04 DIAGNOSIS — F329 Major depressive disorder, single episode, unspecified: Secondary | ICD-10-CM | POA: Diagnosis not present

## 2012-05-04 DIAGNOSIS — I10 Essential (primary) hypertension: Secondary | ICD-10-CM | POA: Diagnosis not present

## 2012-05-04 DIAGNOSIS — F411 Generalized anxiety disorder: Secondary | ICD-10-CM | POA: Diagnosis not present

## 2012-05-04 DIAGNOSIS — IMO0001 Reserved for inherently not codable concepts without codable children: Secondary | ICD-10-CM | POA: Diagnosis not present

## 2012-05-04 DIAGNOSIS — Z96659 Presence of unspecified artificial knee joint: Secondary | ICD-10-CM | POA: Diagnosis not present

## 2012-05-05 DIAGNOSIS — F329 Major depressive disorder, single episode, unspecified: Secondary | ICD-10-CM | POA: Diagnosis not present

## 2012-05-05 DIAGNOSIS — I10 Essential (primary) hypertension: Secondary | ICD-10-CM | POA: Diagnosis not present

## 2012-05-05 DIAGNOSIS — IMO0001 Reserved for inherently not codable concepts without codable children: Secondary | ICD-10-CM | POA: Diagnosis not present

## 2012-05-05 DIAGNOSIS — Z96659 Presence of unspecified artificial knee joint: Secondary | ICD-10-CM | POA: Diagnosis not present

## 2012-05-05 DIAGNOSIS — F411 Generalized anxiety disorder: Secondary | ICD-10-CM | POA: Diagnosis not present

## 2012-05-07 DIAGNOSIS — Z96659 Presence of unspecified artificial knee joint: Secondary | ICD-10-CM | POA: Diagnosis not present

## 2012-05-07 DIAGNOSIS — IMO0001 Reserved for inherently not codable concepts without codable children: Secondary | ICD-10-CM | POA: Diagnosis not present

## 2012-05-07 DIAGNOSIS — F329 Major depressive disorder, single episode, unspecified: Secondary | ICD-10-CM | POA: Diagnosis not present

## 2012-05-07 DIAGNOSIS — I10 Essential (primary) hypertension: Secondary | ICD-10-CM | POA: Diagnosis not present

## 2012-05-07 DIAGNOSIS — F411 Generalized anxiety disorder: Secondary | ICD-10-CM | POA: Diagnosis not present

## 2012-05-08 DIAGNOSIS — Z96659 Presence of unspecified artificial knee joint: Secondary | ICD-10-CM | POA: Diagnosis not present

## 2012-05-08 DIAGNOSIS — IMO0001 Reserved for inherently not codable concepts without codable children: Secondary | ICD-10-CM | POA: Diagnosis not present

## 2012-05-08 DIAGNOSIS — I10 Essential (primary) hypertension: Secondary | ICD-10-CM | POA: Diagnosis not present

## 2012-05-08 DIAGNOSIS — F329 Major depressive disorder, single episode, unspecified: Secondary | ICD-10-CM | POA: Diagnosis not present

## 2012-05-08 DIAGNOSIS — F411 Generalized anxiety disorder: Secondary | ICD-10-CM | POA: Diagnosis not present

## 2012-05-08 NOTE — Discharge Summary (Signed)
  ABBREVIATED DISCHARGE SUMMARY      DATE OF HOSPITALIZATION: 30 April 2012  REASON FOR HOSPITALIZATION:  71 yo wm with hx of end stage djd left knee and pain.  Failed conservative tx.     SIGNIFICANT FINDINGS:  DJD  OPERATION:  Left total knee replacement  FINAL DIAGNOSIS:  same  SECONDARY DIAGNOSIS: none  CONSULTANTS:  none  DISCHARGE CONDITION:  STABLE  DISCHARGED TO:  HOME

## 2012-05-09 DIAGNOSIS — IMO0001 Reserved for inherently not codable concepts without codable children: Secondary | ICD-10-CM | POA: Diagnosis not present

## 2012-05-09 DIAGNOSIS — F411 Generalized anxiety disorder: Secondary | ICD-10-CM | POA: Diagnosis not present

## 2012-05-09 DIAGNOSIS — I10 Essential (primary) hypertension: Secondary | ICD-10-CM | POA: Diagnosis not present

## 2012-05-09 DIAGNOSIS — Z96659 Presence of unspecified artificial knee joint: Secondary | ICD-10-CM | POA: Diagnosis not present

## 2012-05-09 DIAGNOSIS — F329 Major depressive disorder, single episode, unspecified: Secondary | ICD-10-CM | POA: Diagnosis not present

## 2012-05-12 DIAGNOSIS — I10 Essential (primary) hypertension: Secondary | ICD-10-CM | POA: Diagnosis not present

## 2012-05-12 DIAGNOSIS — Z96659 Presence of unspecified artificial knee joint: Secondary | ICD-10-CM | POA: Diagnosis not present

## 2012-05-12 DIAGNOSIS — IMO0001 Reserved for inherently not codable concepts without codable children: Secondary | ICD-10-CM | POA: Diagnosis not present

## 2012-05-12 DIAGNOSIS — F411 Generalized anxiety disorder: Secondary | ICD-10-CM | POA: Diagnosis not present

## 2012-05-12 DIAGNOSIS — F329 Major depressive disorder, single episode, unspecified: Secondary | ICD-10-CM | POA: Diagnosis not present

## 2012-05-13 DIAGNOSIS — F329 Major depressive disorder, single episode, unspecified: Secondary | ICD-10-CM | POA: Diagnosis not present

## 2012-05-13 DIAGNOSIS — Z96659 Presence of unspecified artificial knee joint: Secondary | ICD-10-CM | POA: Diagnosis not present

## 2012-05-13 DIAGNOSIS — I10 Essential (primary) hypertension: Secondary | ICD-10-CM | POA: Diagnosis not present

## 2012-05-13 DIAGNOSIS — IMO0001 Reserved for inherently not codable concepts without codable children: Secondary | ICD-10-CM | POA: Diagnosis not present

## 2012-05-13 DIAGNOSIS — F411 Generalized anxiety disorder: Secondary | ICD-10-CM | POA: Diagnosis not present

## 2012-05-14 DIAGNOSIS — Z96659 Presence of unspecified artificial knee joint: Secondary | ICD-10-CM | POA: Diagnosis not present

## 2012-05-14 DIAGNOSIS — F329 Major depressive disorder, single episode, unspecified: Secondary | ICD-10-CM | POA: Diagnosis not present

## 2012-05-14 DIAGNOSIS — F411 Generalized anxiety disorder: Secondary | ICD-10-CM | POA: Diagnosis not present

## 2012-05-14 DIAGNOSIS — I10 Essential (primary) hypertension: Secondary | ICD-10-CM | POA: Diagnosis not present

## 2012-05-14 DIAGNOSIS — IMO0001 Reserved for inherently not codable concepts without codable children: Secondary | ICD-10-CM | POA: Diagnosis not present

## 2012-05-15 DIAGNOSIS — F411 Generalized anxiety disorder: Secondary | ICD-10-CM | POA: Diagnosis not present

## 2012-05-15 DIAGNOSIS — Z96659 Presence of unspecified artificial knee joint: Secondary | ICD-10-CM | POA: Diagnosis not present

## 2012-05-15 DIAGNOSIS — IMO0001 Reserved for inherently not codable concepts without codable children: Secondary | ICD-10-CM | POA: Diagnosis not present

## 2012-05-15 DIAGNOSIS — F329 Major depressive disorder, single episode, unspecified: Secondary | ICD-10-CM | POA: Diagnosis not present

## 2012-05-15 DIAGNOSIS — I10 Essential (primary) hypertension: Secondary | ICD-10-CM | POA: Diagnosis not present

## 2012-05-20 DIAGNOSIS — M1710 Unilateral primary osteoarthritis, unspecified knee: Secondary | ICD-10-CM | POA: Diagnosis not present

## 2012-05-20 DIAGNOSIS — R269 Unspecified abnormalities of gait and mobility: Secondary | ICD-10-CM | POA: Diagnosis not present

## 2012-05-20 DIAGNOSIS — M25569 Pain in unspecified knee: Secondary | ICD-10-CM | POA: Diagnosis not present

## 2012-05-27 DIAGNOSIS — M25569 Pain in unspecified knee: Secondary | ICD-10-CM | POA: Diagnosis not present

## 2012-05-27 DIAGNOSIS — M1710 Unilateral primary osteoarthritis, unspecified knee: Secondary | ICD-10-CM | POA: Diagnosis not present

## 2012-05-27 DIAGNOSIS — R269 Unspecified abnormalities of gait and mobility: Secondary | ICD-10-CM | POA: Diagnosis not present

## 2012-06-03 DIAGNOSIS — M25569 Pain in unspecified knee: Secondary | ICD-10-CM | POA: Diagnosis not present

## 2012-06-03 DIAGNOSIS — R269 Unspecified abnormalities of gait and mobility: Secondary | ICD-10-CM | POA: Diagnosis not present

## 2012-06-03 DIAGNOSIS — M1710 Unilateral primary osteoarthritis, unspecified knee: Secondary | ICD-10-CM | POA: Diagnosis not present

## 2012-06-05 DIAGNOSIS — M1710 Unilateral primary osteoarthritis, unspecified knee: Secondary | ICD-10-CM | POA: Diagnosis not present

## 2012-06-05 DIAGNOSIS — R269 Unspecified abnormalities of gait and mobility: Secondary | ICD-10-CM | POA: Diagnosis not present

## 2012-06-05 DIAGNOSIS — M25569 Pain in unspecified knee: Secondary | ICD-10-CM | POA: Diagnosis not present

## 2012-06-10 DIAGNOSIS — M1710 Unilateral primary osteoarthritis, unspecified knee: Secondary | ICD-10-CM | POA: Diagnosis not present

## 2012-06-10 DIAGNOSIS — R269 Unspecified abnormalities of gait and mobility: Secondary | ICD-10-CM | POA: Diagnosis not present

## 2012-06-10 DIAGNOSIS — M25569 Pain in unspecified knee: Secondary | ICD-10-CM | POA: Diagnosis not present

## 2012-06-11 DIAGNOSIS — E7800 Pure hypercholesterolemia, unspecified: Secondary | ICD-10-CM | POA: Diagnosis not present

## 2012-06-11 DIAGNOSIS — Z79899 Other long term (current) drug therapy: Secondary | ICD-10-CM | POA: Diagnosis not present

## 2012-06-12 DIAGNOSIS — M25569 Pain in unspecified knee: Secondary | ICD-10-CM | POA: Diagnosis not present

## 2012-06-12 DIAGNOSIS — R269 Unspecified abnormalities of gait and mobility: Secondary | ICD-10-CM | POA: Diagnosis not present

## 2012-06-12 DIAGNOSIS — M1710 Unilateral primary osteoarthritis, unspecified knee: Secondary | ICD-10-CM | POA: Diagnosis not present

## 2012-06-17 DIAGNOSIS — Z471 Aftercare following joint replacement surgery: Secondary | ICD-10-CM | POA: Diagnosis not present

## 2012-06-17 DIAGNOSIS — Z96659 Presence of unspecified artificial knee joint: Secondary | ICD-10-CM | POA: Diagnosis not present

## 2012-06-17 DIAGNOSIS — M25579 Pain in unspecified ankle and joints of unspecified foot: Secondary | ICD-10-CM | POA: Diagnosis not present

## 2012-07-04 DIAGNOSIS — J329 Chronic sinusitis, unspecified: Secondary | ICD-10-CM | POA: Diagnosis not present

## 2012-07-16 ENCOUNTER — Ambulatory Visit
Admission: RE | Admit: 2012-07-16 | Discharge: 2012-07-16 | Disposition: A | Discharge status: Home / Self Care | Payer: Medicare Other | Source: Ambulatory Visit | Attending: Family Medicine | Admitting: Family Medicine

## 2012-07-16 ENCOUNTER — Ambulatory Visit (INDEPENDENT_AMBULATORY_CARE_PROVIDER_SITE_OTHER): Payer: Medicare Other | Admitting: Family Medicine

## 2012-07-16 ENCOUNTER — Encounter: Payer: Self-pay | Admitting: Family Medicine

## 2012-07-16 VITALS — BP 120/76 | HR 83 | Ht 69.0 in | Wt 196.0 lb

## 2012-07-16 DIAGNOSIS — S93499A Sprain of other ligament of unspecified ankle, initial encounter: Secondary | ICD-10-CM | POA: Diagnosis not present

## 2012-07-16 DIAGNOSIS — M79641 Pain in right hand: Secondary | ICD-10-CM

## 2012-07-16 DIAGNOSIS — M19079 Primary osteoarthritis, unspecified ankle and foot: Secondary | ICD-10-CM | POA: Diagnosis not present

## 2012-07-16 DIAGNOSIS — M79642 Pain in left hand: Secondary | ICD-10-CM | POA: Insufficient documentation

## 2012-07-16 DIAGNOSIS — M7660 Achilles tendinitis, unspecified leg: Secondary | ICD-10-CM | POA: Insufficient documentation

## 2012-07-16 DIAGNOSIS — M19049 Primary osteoarthritis, unspecified hand: Secondary | ICD-10-CM | POA: Diagnosis not present

## 2012-07-16 DIAGNOSIS — M19039 Primary osteoarthritis, unspecified wrist: Secondary | ICD-10-CM | POA: Diagnosis not present

## 2012-07-16 DIAGNOSIS — M79609 Pain in unspecified limb: Secondary | ICD-10-CM | POA: Diagnosis not present

## 2012-07-16 DIAGNOSIS — S86011A Strain of right Achilles tendon, initial encounter: Secondary | ICD-10-CM

## 2012-07-16 MED ORDER — NITROGLYCERIN 0.2 MG/HR TD PT24: MEDICATED_PATCH | TRANSDERMAL | Status: DC

## 2012-07-16 NOTE — Progress Notes (Signed)
Dennis York is a 72 y.o. male who presents to [facility] today for evaluation of right posterior ankle pain. Patient was seen at [facility] and has had bilateral knee replacements. He was evaluated and found to have significantly arthritic and malalignment bilateral ankle joints.  He was referred here for consideration of orthotics and to discuss posterior ankle pain.   Patient denies any significant ankle or foot pain. He notes that the pain is located in his right Achilles tendon worse with walking and better with rest. He denies any radiating pain weakness or numbness. Despite his significant bilateral foot breakdown he does not have foot pain.  He uses over-the-counter orthotics and high top boots.  He remains active and plays golf.    Now that he has bilateral knee replacement he feels well and is increasing his activity level  Additionally patient notes bilateral hand and wrist pain. This is been present for several years worsening with activity. He is retired but Electronics engineer as a hobby. He notes when he works with his hands more he is worsening pain. He notes aching at the first MCP joints bilaterally and diffuse wrist pain. He denies any radiating pain weakness or numbness.   PMH reviewed. Hypertension History  Substance Use Topics  . Smoking status: Former Smoker -- 2.0 packs/day for 30 years    Types: Cigarettes    Quit date: 02/28/1995  . Smokeless tobacco: Never Used  . Alcohol Use: 0.0 oz/week     Comment: 05/01/2012 "totally quit alcohol in ~ 10 months; I was drinking too much beer so I quit"   ROS as above otherwise neg   Exam:  BP 120/76  Pulse 83  Ht 5\' 9"  (1.753 m)  Wt 196 lb (88.905 kg)  BMI 28.94 kg/m2 Gen: Well NAD MSK:  Right foot: Significant ankle DJD. He has talar and subtalar subluxation and pronation. His subtalar joint has normal motion.  He has mild synovitis and ankle effusion.  Additionally has significant bunion of the first MTP.  He has  normal pulses and sensation.  His Achilles tendon is normal-appearing with no significant nodules it is mildly tender about 2-3 cm proximal to the insertion.  He has no posterior tibialis function on toe standing.   Left foot: Significant ankle DJD with talar and subtalar subluxation pronation.  He has profound talar-metatarsal joint lateral deviation.  He has no bunion.  Normal pulses and sensation.  Nontender.   Bilateral hands.  Diffuse wrist synovitis present.  Tender to palpation over bilateral first MCP joint.  Normal hand motion.  Normal sensation.   Limited musculoskeletal ultrasound of the right posterior ankle.  Achilles tendon insertion is normal-appearing. Approximately 2-3 cm proximal to insertion he has thickening and an area of hypoechoic change no neovessels.  He has an intact posterior tibialis tendon.

## 2012-07-16 NOTE — Assessment & Plan Note (Signed)
Patient has a profound ankle and foot breakdown and to DJD.  He is pain free with his current orthotics and activity.  I am reluctant to placement custom orthotics as I feel this may worsen his progression.  We'll followup in 4 weeks for reevaluation.

## 2012-07-16 NOTE — Assessment & Plan Note (Signed)
Likely DJD of the first MCP and wrist. However uncertain. We'll obtain bilateral hand and wrist x-rays in the antrum and followup in 4 week. Advice Tylenol for analgesia with activity

## 2012-07-16 NOTE — Patient Instructions (Signed)
Thank you for coming in today. We will try some exercise.  Do the heel raised 2 sets 15 reps daily.  Come back in 4 weeks.  Nitroglycerin Protocol   Apply 1/4 nitroglycerin patch to affected area daily.  Change position of patch within the affected area every 24 hours.  You may experience a headache during the first 1-2 weeks of using the patch, these should subside.  If you experience headaches after beginning nitroglycerin patch treatment, you may take your preferred over the counter pain reliever.  Another side effect of the nitroglycerin patch is skin irritation or rash related to patch adhesive.  Please notify our office if you develop more severe headaches or rash, and stop the patch.  Tendon healing with nitroglycerin patch may require 12 to 24 weeks depending on the extent of injury.  Men should not use if taking Viagra, Cialis, or Levitra.   Do not use if you have migraines or rosacea.

## 2012-07-16 NOTE — Assessment & Plan Note (Signed)
Patient has mild right Achilles tendinitis in the setting of profound ankle DJD and subluxation.  Plan:  Small heel lift (3/16 inch).  Nitroglycerin patch protocol.  Eccentric exercises.  Followup in 4 weeks  

## 2012-07-25 ENCOUNTER — Telehealth: Payer: Self-pay | Admitting: Family Medicine

## 2012-07-25 NOTE — Telephone Encounter (Signed)
Called pt with hand and wrist xray results. Left a message.

## 2012-08-06 ENCOUNTER — Ambulatory Visit (INDEPENDENT_AMBULATORY_CARE_PROVIDER_SITE_OTHER): Payer: Medicare Other | Admitting: Family Medicine

## 2012-08-06 VITALS — BP 143/79 | Ht 69.0 in | Wt 195.0 lb

## 2012-08-06 DIAGNOSIS — S93499A Sprain of other ligament of unspecified ankle, initial encounter: Secondary | ICD-10-CM | POA: Diagnosis not present

## 2012-08-06 DIAGNOSIS — S86011A Strain of right Achilles tendon, initial encounter: Secondary | ICD-10-CM

## 2012-08-06 DIAGNOSIS — M7660 Achilles tendinitis, unspecified leg: Secondary | ICD-10-CM | POA: Diagnosis not present

## 2012-08-06 DIAGNOSIS — M19079 Primary osteoarthritis, unspecified ankle and foot: Secondary | ICD-10-CM | POA: Diagnosis not present

## 2012-08-06 DIAGNOSIS — S96819A Strain of other specified muscles and tendons at ankle and foot level, unspecified foot, initial encounter: Secondary | ICD-10-CM | POA: Diagnosis not present

## 2012-08-06 DIAGNOSIS — M79609 Pain in unspecified limb: Secondary | ICD-10-CM | POA: Diagnosis not present

## 2012-08-06 DIAGNOSIS — M79642 Pain in left hand: Secondary | ICD-10-CM

## 2012-08-06 MED ORDER — NITROGLYCERIN 0.2 MG/HR TD PT24: MEDICATED_PATCH | TRANSDERMAL | Status: DC

## 2012-08-06 NOTE — Progress Notes (Signed)
Dennis York is a 72 y.o. male who presents to [facility] today for followup right Achilles tendinitis. Treated with nitroglycerin protocol and eccentric heel exercises.  He notes no significant improvement in his overall pain 3 weeks after starting treatment. However he has tolerated the exercise and the patches well. He denies any significant pain or weakness.  Additionally he notes bilateral hand and wrist pain right hand is worse. He has pain in the dorsal aspect of his metacarpals extending to the wrist. This is worse with activity better with rest   PMH reviewed.  History  Substance Use Topics  . Smoking status: Former Smoker -- 2.0 packs/day for 30 years    Types: Cigarettes    Quit date: 02/28/1995  . Smokeless tobacco: Never Used  . Alcohol Use: 0.0 oz/week     Comment: 05/01/2012 "totally quit alcohol in ~ 10 months; I was drinking too much beer so I quit"   ROS as above otherwise neg   Exam:  BP 143/79  Ht 5\' 9"  (1.753 m)  Wt 195 lb (88.451 kg)  BMI 28.80 kg/m2 Gen: Well NAD MSK: Right foot: Significant ankle DJD. He has talar and subtalar subluxation and pronation. His subtalar joint has normal motion.  He has mild synovitis and ankle effusion.  Additionally has significant bunion of the first MTP.  He has normal pulses and sensation.  His Achilles tendon is normal-appearing with no significant nodules. Non-tender  He has no posterior tibialis function on toe standing.   Bilateral hands.  Diffuse wrist synovitis present.  Tender to palpation over bilateral first MCP joint.  Normal hand motion.  Normal sensation.    Dg Wrist Complete Left  07/16/2012  *RADIOLOGY REPORT*  Clinical Data: Wrist pain, no injury  LEFT WRIST - COMPLETE 3+ VIEW  Comparison: None.  Findings: There is some loss of radiocarpal joint space on the left.  Slight irregularity of the left ulnar styloid is noted. There is a widened scapholunate distance on the left which could indicate ligamentous  injury.  IMPRESSION: Degenerative changes involving the radiocarpal joint space and ulnar styloid on the left.  Widened scapholunate distance.  Cannot exclude scapholunate ligamentous injury.   Original Report Authenticated By: Dwyane Dee, M.D.    Dg Wrist Complete Right  07/16/2012  *RADIOLOGY REPORT*  Clinical Data: Wrist pain, no injury  RIGHT WRIST - COMPLETE 3+ VIEW  Comparison: None.  Findings: There is significant loss of right radiocarpal joint space with subchondral cyst formation in the distal right radius. There is abnormality of the right ulnar styloid which may be due to degenerative change and/or trauma.  The carpal bones are in normal position.  Degenerative change is present in the right first metacarpal - trapezium articulation with loss of joint space, sclerosis, and spurring.  IMPRESSION: Degenerative change primarily involving the right radiocarpal joint space and the articulation of the base of the first metacarpal with trapezium.  No erosion.   Original Report Authenticated By: Dwyane Dee, M.D.    Dg Hand Complete Left  07/16/2012  *RADIOLOGY REPORT*  Clinical Data: Hand pain, no injury  LEFT HAND - COMPLETE 3+ VIEW  Comparison: None.  Findings: Views of the left hand show loss of the radiocarpal joint space with sclerosis and subchondral cyst formation within the distal left radius.  There is some degenerative change at the articulation of the base of the first metacarpal with trapezium. The DIP and PIP joints are relativelyl normal with very little degenerative change present.  No  erosion is seen.  There is some degenerative change also involving the left first MCP joint.  IMPRESSION: Degenerative changes primarily in the wrist on the left but also particularly involving the base of the first metacarpal- trapezium and involving the left first MCP joint.  No erosion.   Original Report Authenticated By: Dwyane Dee, M.D.    Dg Hand Complete Right  07/16/2012  *RADIOLOGY REPORT*   Clinical Data: Hand pain, no injury  RIGHT HAND - COMPLETE 3+ VIEW  Comparison: None.  Findings: Views of the right hand show considerable loss of radiocarpal joint space with subchondral cyst formation within the distal right radius and ulna.  There is degenerative change at the right first metacarpal - trapezium articulation with loss of joint space, sclerosis, and spurring.  Degenerative change also is noted involving the right first MCP joint with diffuse involvement of DIP and PIP joints.  No erosion is seen.  IMPRESSION: Advanced degenerative changes of the right hand and wrist as noted above.   Original Report Authenticated By: Dwyane Dee, M.D.

## 2012-08-06 NOTE — Assessment & Plan Note (Signed)
Patient has mild right Achilles tendinitis in the setting of profound ankle DJD and subluxation.  Plan:  Small heel lift (3/16 inch).  Nitroglycerin patch protocol.  Eccentric exercises.  Followup in 4 weeks

## 2012-08-06 NOTE — Assessment & Plan Note (Signed)
Secondary to DJD Advise Tylenol Followup in one month

## 2012-08-06 NOTE — Patient Instructions (Signed)
Thank you for coming in today. Continue the exercises and the patches.  Come back in 1 month.  Let me know if you get worse.

## 2012-08-13 ENCOUNTER — Ambulatory Visit: Payer: Self-pay | Admitting: Family Medicine

## 2012-08-20 DIAGNOSIS — E785 Hyperlipidemia, unspecified: Secondary | ICD-10-CM | POA: Diagnosis not present

## 2012-09-19 ENCOUNTER — Other Ambulatory Visit: Payer: Self-pay | Admitting: Nurse Practitioner

## 2012-10-01 DIAGNOSIS — I2510 Atherosclerotic heart disease of native coronary artery without angina pectoris: Secondary | ICD-10-CM | POA: Diagnosis not present

## 2012-10-01 DIAGNOSIS — E7800 Pure hypercholesterolemia, unspecified: Secondary | ICD-10-CM | POA: Diagnosis not present

## 2012-10-07 DIAGNOSIS — M19039 Primary osteoarthritis, unspecified wrist: Secondary | ICD-10-CM | POA: Diagnosis not present

## 2012-10-07 DIAGNOSIS — M19079 Primary osteoarthritis, unspecified ankle and foot: Secondary | ICD-10-CM | POA: Diagnosis not present

## 2012-12-10 DIAGNOSIS — I10 Essential (primary) hypertension: Secondary | ICD-10-CM | POA: Diagnosis not present

## 2012-12-10 DIAGNOSIS — R5383 Other fatigue: Secondary | ICD-10-CM | POA: Diagnosis not present

## 2012-12-10 DIAGNOSIS — E7800 Pure hypercholesterolemia, unspecified: Secondary | ICD-10-CM | POA: Diagnosis not present

## 2012-12-10 DIAGNOSIS — E871 Hypo-osmolality and hyponatremia: Secondary | ICD-10-CM | POA: Diagnosis not present

## 2012-12-10 DIAGNOSIS — Z79899 Other long term (current) drug therapy: Secondary | ICD-10-CM | POA: Diagnosis not present

## 2012-12-10 DIAGNOSIS — M7660 Achilles tendinitis, unspecified leg: Secondary | ICD-10-CM | POA: Diagnosis not present

## 2012-12-10 DIAGNOSIS — M2550 Pain in unspecified joint: Secondary | ICD-10-CM | POA: Diagnosis not present

## 2012-12-10 DIAGNOSIS — R5381 Other malaise: Secondary | ICD-10-CM | POA: Diagnosis not present

## 2012-12-24 DIAGNOSIS — E7800 Pure hypercholesterolemia, unspecified: Secondary | ICD-10-CM | POA: Diagnosis not present

## 2012-12-24 DIAGNOSIS — E871 Hypo-osmolality and hyponatremia: Secondary | ICD-10-CM | POA: Diagnosis not present

## 2013-01-20 DIAGNOSIS — Z125 Encounter for screening for malignant neoplasm of prostate: Secondary | ICD-10-CM | POA: Diagnosis not present

## 2013-01-20 DIAGNOSIS — E871 Hypo-osmolality and hyponatremia: Secondary | ICD-10-CM | POA: Diagnosis not present

## 2013-03-16 DIAGNOSIS — T148 Other injury of unspecified body region: Secondary | ICD-10-CM | POA: Diagnosis not present

## 2013-03-24 DIAGNOSIS — M19039 Primary osteoarthritis, unspecified wrist: Secondary | ICD-10-CM | POA: Diagnosis not present

## 2013-03-24 DIAGNOSIS — M25569 Pain in unspecified knee: Secondary | ICD-10-CM | POA: Diagnosis not present

## 2013-03-29 ENCOUNTER — Encounter: Payer: Self-pay | Admitting: Cardiology

## 2013-04-07 ENCOUNTER — Encounter: Payer: Self-pay | Admitting: Cardiology

## 2013-04-07 ENCOUNTER — Ambulatory Visit (INDEPENDENT_AMBULATORY_CARE_PROVIDER_SITE_OTHER): Payer: Medicare Other | Admitting: Cardiology

## 2013-04-07 VITALS — BP 134/74 | HR 84 | Ht 69.0 in | Wt 205.0 lb

## 2013-04-07 DIAGNOSIS — E785 Hyperlipidemia, unspecified: Secondary | ICD-10-CM

## 2013-04-07 DIAGNOSIS — I10 Essential (primary) hypertension: Secondary | ICD-10-CM | POA: Diagnosis not present

## 2013-04-07 DIAGNOSIS — I2510 Atherosclerotic heart disease of native coronary artery without angina pectoris: Secondary | ICD-10-CM

## 2013-04-07 MED ORDER — ATORVASTATIN CALCIUM 20 MG PO TABS: 20.0000 mg | ORAL_TABLET | Freq: Every day | ORAL | Status: DC

## 2013-04-07 NOTE — Progress Notes (Signed)
[address] [place], [place]  [address] Phone: [phone] Fax:  [phone]  Date:  04/07/2013   ID:  Dennis York, DOB 1940/10/25, MRN [id number]  PCP:  Ginette Otto, MD   History of Present Illness: Dennis York is a 72 y.o. male with coronary artery disease status post stent to LAD in 1994 with hypertension, hyperlipidemia, gout, former smoker here for followup.  Nuclear stress test 2013 was low risk but no ischemia.  In the past, has had issues with alcohol, rehabilitation efforts. He's been compliant with his medications. Occasionally feels tachycardic/palpitations/anxiety.  In regards to coronary artery disease, angioplasty nearly 20 years ago and having no exertional symptoms.  He is had issues in the past with some cholesterol medications. Pravastatin, low-dose has been taken in the past, LDL in October of 2013 was 165. I made the switch for him to take atorvastatin at that time. LDL 147.  Surf fishing last 4 days.    Wt Readings from Last 3 Encounters:  04/07/13 205 lb (92.987 kg)  08/06/12 195 lb (88.451 kg)  07/16/12 196 lb (88.905 kg)     Past Medical History  Diagnosis Date  . Alcohol abuse, in remission   . Hyperlipidemia   . HTN (hypertension)   . Anxiety   . Normal nuclear stress test     04/2012 [facility]  . Depression   . Complication of anesthesia     allergic reactions to Barbituates  . CAD (coronary artery disease)     Prior PCI to LAD in 1994 sees Dr Dione Housekeeper  . Kidney stones     hx of   . GERD (gastroesophageal reflux disease)   . H/O hiatal hernia   . Swelling of both ankles     "if I'm on my feet alot or flying to [place] to see daughter" (05/01/2012)  . Lower GI bleeding     "had colonoscopy; it was internal  hemorrhoids"   . Arthritis     "hands, wrists, ankle on right" (05/01/2012)  . Hypocholesterolemia   . Left knee DJD   . Hiatal hernia   . H/O macrocytic anemia 10/2011    , Mild,  Hemoglobin 12.5  . Vitamin B 12 deficiency 10/2011    , Start oral replacement  . Iron deficiency   . SIADH (syndrome of inappropriate ADH production)     Past Surgical History  Procedure Laterality Date  . Replacement total knee  08/2008; 04/30/2012    right; left  . US echocardiography  09/04/2005    EF 55-60%  . Joint replacement    . Cardiovascular stress test    . Eye surgery      bilateral cataracts removed 2013  . Tonsillectomy  1946  . Minor hemorrhoidectomy  ~ 1960  . Knee arthroscopy  ~ 2001    left  . Cataract extraction w/ intraocular lens  implant, bilateral  ~ 2010; 03/2012    right; left  . Cardiac catheterization  01/30/1993    EF 55-60%  . Coronary angioplasty  1990's  . Total knee arthroplasty  04/30/2012    Procedure: TOTAL KNEE ARTHROPLASTY;  Surgeon: Loreta Ave, MD;  Location: [facility] OR;  Service: Orthopedics;  Laterality: Left;  left total knee arthroplasty    Current Outpatient Prescriptions  Medication Sig Dispense Refill  . amLODipine (NORVASC) 5 MG tablet Take 1 tablet (5 mg total) by mouth daily. For control of high blood pressure  30 tablet  0  . aspirin 81 MG tablet Take 81 mg by mouth daily.      . metoprolol tartrate (LOPRESSOR) 25 MG tablet Take 0.5 tablets (12.5 mg total) by mouth 2 (two) times daily. For control of high blood pressure  30 tablet  0  . omeprazole (PRILOSEC OTC) 20 MG tablet Take 20 mg by mouth as needed. For control of stomach acid secretion and helps GERD.      Marland Kitchen atorvastatin (LIPITOR) 20 MG tablet Take 1 tablet (20 mg total) by mouth daily.  30 tablet  11   No current facility-administered medications for this visit.    Allergies:    Allergies  Allergen Reactions  . Ativan [Lorazepam] Other (See Comments)    ATAXIA LASTING FOR SEVERAL DAYS  . Barbiturates Swelling and Other (See Comments)    "skin on head of penis came off; raw meat"  . Bee Venom Anaphylaxis    Patient carries an epi pen  . Penicillins Anaphylaxis  .  Zetia [Ezetimibe]   . Statins Anxiety    Social History:  The patient  reports that he quit smoking about 18 years ago. His smoking use included Cigarettes. He has a 60 pack-year smoking history. He has never used smokeless tobacco. He reports that he does not drink alcohol or use illicit drugs.   ROS:  Please see the history of present illness.  Denies any bleeding, syncope, orthopnea, PND.  All other systems reviewed and negative.   PHYSICAL EXAM: VS:  BP 134/74  Pulse 84  Ht 5\' 9"  (1.753 m)  Wt 205 lb (92.987 kg)  BMI 30.26 kg/m2 Well nourished, well developed, in no acute distress HEENT: normal Neck: no JVD Cardiac:  normal S1, S2; RRR; no murmur Lungs:  clear to auscultation bilaterally, no wheezing, rhonchi or rales Abd: soft, nontender, no hepatomegaly Ext: no edema Skin: warm and dry Neuro: no focal abnormalities noted     EKG: 04/07/13-normal sinus rhythm, 89, nonspecific ST-T wave changes  ASSESSMENT AND PLAN:  1. Coronary artery disease status post LAD stent-currently stable, no exertional anginal symptoms. Continuing with secondary prevention efforts. Promoting diet, exercise. 2. Hyperlipidemia-has had some issues with statins in the past. Her medication list shows pravastatin 10 mg. We have tried atorvastatin in the past. He recently has run out. I'm restarting atorvastatin 20 mg. Past lipid/LDL 147. We will check lipid profile in 6 months. 3. Hypertension-currently stable, no changes made to medications. Diet, exercise. Medications reviewed. 4. We will see him back in 6 months with lipid profile.  Signed, Donato Schultz, MD [facility]  04/07/2013 9:39 AM

## 2013-04-07 NOTE — Patient Instructions (Addendum)
Your physician recommends that you continue on your current medications as directed. Please refer to the Current Medication list given to you today.  Your physician recommends that you return for lab work in: 6 months a few day prior to 6 months office visit. Lipid  Your physician wants you to follow-up in: 6 months with Dr. Anne Fu. You will receive a reminder letter in the mail two months in advance. If you don't receive a letter, please call our office to schedule the follow-up appointment.

## 2013-05-20 ENCOUNTER — Other Ambulatory Visit: Payer: Self-pay

## 2013-05-20 MED ORDER — AMLODIPINE BESYLATE 5 MG PO TABS: 5.0000 mg | ORAL_TABLET | Freq: Every day | ORAL | Status: DC

## 2013-07-08 DIAGNOSIS — Z23 Encounter for immunization: Secondary | ICD-10-CM | POA: Diagnosis not present

## 2013-07-09 DIAGNOSIS — H35379 Puckering of macula, unspecified eye: Secondary | ICD-10-CM | POA: Diagnosis not present

## 2013-07-15 DIAGNOSIS — E7800 Pure hypercholesterolemia, unspecified: Secondary | ICD-10-CM | POA: Diagnosis not present

## 2013-07-15 DIAGNOSIS — Z79899 Other long term (current) drug therapy: Secondary | ICD-10-CM | POA: Diagnosis not present

## 2013-07-15 DIAGNOSIS — I10 Essential (primary) hypertension: Secondary | ICD-10-CM | POA: Diagnosis not present

## 2013-09-08 ENCOUNTER — Telehealth: Payer: Self-pay | Admitting: Cardiology

## 2013-09-08 NOTE — Telephone Encounter (Signed)
New message          Pt would like a call back about a stress test

## 2013-09-11 NOTE — Telephone Encounter (Signed)
Spoke with pt and he wanted to schedule 6 mth f/u appt and cholesterol lab, which I scheduled. I told pt he is not scheduled for stress test and as long as he is asymptomatic he can wait and speak with Dr. Marlou Porch at appt to see if he needs one. Pt verbalized understanding.

## 2013-10-06 ENCOUNTER — Other Ambulatory Visit (INDEPENDENT_AMBULATORY_CARE_PROVIDER_SITE_OTHER): Payer: Medicare Other

## 2013-10-06 DIAGNOSIS — E785 Hyperlipidemia, unspecified: Secondary | ICD-10-CM | POA: Diagnosis not present

## 2013-10-06 LAB — LIPID PANEL
Cholesterol: 154 mg/dL (ref 0–200)
HDL: 39.3 mg/dL (ref >39.00)
LDL Cholesterol: 96 mg/dL (ref 0–99)
TRIGLYCERIDES: 96 mg/dL (ref 0.0–149.0)
Total CHOL/HDL Ratio: 4
VLDL: 19.2 mg/dL (ref 0.0–40.0)

## 2013-10-07 ENCOUNTER — Telehealth: Payer: Self-pay | Admitting: *Deleted

## 2013-10-07 ENCOUNTER — Other Ambulatory Visit: Payer: Medicare Other

## 2013-10-14 ENCOUNTER — Encounter: Payer: Self-pay | Admitting: Cardiology

## 2013-10-14 ENCOUNTER — Ambulatory Visit (INDEPENDENT_AMBULATORY_CARE_PROVIDER_SITE_OTHER): Payer: Medicare Other | Admitting: Cardiology

## 2013-10-14 VITALS — BP 143/78 | HR 66 | Ht 69.0 in | Wt 208.1 lb

## 2013-10-14 DIAGNOSIS — I10 Essential (primary) hypertension: Secondary | ICD-10-CM

## 2013-10-14 DIAGNOSIS — L821 Other seborrheic keratosis: Secondary | ICD-10-CM | POA: Diagnosis not present

## 2013-10-14 DIAGNOSIS — Z85820 Personal history of malignant melanoma of skin: Secondary | ICD-10-CM | POA: Diagnosis not present

## 2013-10-14 DIAGNOSIS — L819 Disorder of pigmentation, unspecified: Secondary | ICD-10-CM | POA: Diagnosis not present

## 2013-10-14 DIAGNOSIS — I2510 Atherosclerotic heart disease of native coronary artery without angina pectoris: Secondary | ICD-10-CM

## 2013-10-14 DIAGNOSIS — E785 Hyperlipidemia, unspecified: Secondary | ICD-10-CM

## 2013-10-14 DIAGNOSIS — D239 Other benign neoplasm of skin, unspecified: Secondary | ICD-10-CM | POA: Diagnosis not present

## 2013-10-14 DIAGNOSIS — L738 Other specified follicular disorders: Secondary | ICD-10-CM | POA: Diagnosis not present

## 2013-10-14 DIAGNOSIS — L570 Actinic keratosis: Secondary | ICD-10-CM | POA: Diagnosis not present

## 2013-10-14 DIAGNOSIS — L259 Unspecified contact dermatitis, unspecified cause: Secondary | ICD-10-CM | POA: Diagnosis not present

## 2013-10-14 NOTE — Progress Notes (Signed)
[place]. [address] [place], [place]  [address] Phone: [phone] Fax:  [phone]  Date:  10/14/2013   ID:  Dennis York, DOB 21-Jul-1940, MRN [id number]  PCP:  Mathews Argyle, MD   History of Present Illness: Dennis York is a 73 y.o. male with coronary artery disease status post stent to LAD in 1994 with hypertension, hyperlipidemia, gout, former smoker here for followup.  Nuclear stress test 2013 was low risk but no ischemia.  In the past, has had issues with alcohol, rehabilitation efforts. He's been compliant with his medications. Occasionally feels tachycardic/palpitations/anxiety.  In regards to coronary artery disease, angioplasty nearly 20 years ago and having no exertional symptoms.  He is had issues in the past with some cholesterol medications. Pravastatin, low-dose has been taken in the past, LDL in October of 2013 was 165. I made the switch for him to take atorvastatin at that time. LDL 96 from 147.  Surf fishing enjoys. [employer] 3 days a week. Vigorous exercise. Doing well. No anginal symptoms.  He did ask me why he did not do a stress test every year. Last stress test in 2013 was low risk. I explained to him the risks and benefits of serial testing.   Wt Readings from Last 3 Encounters:  10/14/13 208 lb 1.9 oz (94.403 kg)  04/07/13 205 lb (92.987 kg)  08/06/12 195 lb (88.451 kg)     Past Medical History  Diagnosis Date  . Alcohol abuse, in remission   . Hyperlipidemia   . HTN (hypertension)   . Anxiety   . Normal nuclear stress test     04/2012 [facility]  . Depression   . Complication of anesthesia     allergic reactions to Barbituates  . CAD (coronary artery disease)     Prior PCI to LAD in 1994 sees Dr Etter Sjogren  . Kidney stones     hx of   . GERD (gastroesophageal reflux disease)   . H/O hiatal hernia   . Swelling of both ankles     "if I'm on my feet alot or flying to [place] to see daughter" (05/01/2012)  . Lower GI  bleeding     "had colonoscopy; it was internal  hemorrhoids"   . Arthritis     "hands, wrists, ankle on right" (05/01/2012)  . Hypocholesterolemia   . Left knee DJD   . Hiatal hernia   . H/O macrocytic anemia 10/2011    , Mild, Hemoglobin 12.5  . Vitamin B 12 deficiency 10/2011    , Start oral replacement  . Iron deficiency   . SIADH (syndrome of inappropriate ADH production)     Past Surgical History  Procedure Laterality Date  . Replacement total knee  08/2008; 04/30/2012    right; left  . US echocardiography  09/04/2005    EF 55-60%  . Joint replacement    . Cardiovascular stress test    . Eye surgery      bilateral cataracts removed 2013  . Tonsillectomy  1946  . Minor hemorrhoidectomy  ~ 1960  . Knee arthroscopy  ~ 2001    left  . Cataract extraction w/ intraocular lens  implant, bilateral  ~ 2010; 03/2012    right; left  . Cardiac catheterization  01/30/1993    EF 55-60%  . Coronary angioplasty  1990's  . Total knee arthroplasty  04/30/2012    Procedure: TOTAL KNEE ARTHROPLASTY;  Surgeon: Ninetta Lights, MD;  Location: [facility]  OR;  Service: Orthopedics;  Laterality: Left;  left total knee arthroplasty    Current Outpatient Prescriptions  Medication Sig Dispense Refill  . amLODipine (NORVASC) 5 MG tablet Take 1 tablet (5 mg total) by mouth daily. For control of high blood pressure  30 tablet  6  . aspirin 81 MG tablet Take 81 mg by mouth daily.      Marland Kitchen atorvastatin (LIPITOR) 20 MG tablet Take 1 tablet (20 mg total) by mouth daily.  30 tablet  11  . metoprolol tartrate (LOPRESSOR) 25 MG tablet Take 0.5 tablets (12.5 mg total) by mouth 2 (two) times daily. For control of high blood pressure  30 tablet  0  . omeprazole (PRILOSEC OTC) 20 MG tablet Take 20 mg by mouth as needed. For control of stomach acid secretion and helps GERD.       No current facility-administered medications for this visit.    Allergies:    Allergies  Allergen Reactions  . Ativan [Lorazepam] Other (See  Comments)    ATAXIA LASTING FOR SEVERAL DAYS  . Barbiturates Swelling and Other (See Comments)    "skin on head of penis came off; raw meat"  . Bee Venom Anaphylaxis    Patient carries an epi pen  . Penicillins Anaphylaxis  . Zetia [Ezetimibe]   . Statins Anxiety    Social History:  The patient  reports that he quit smoking about 18 years ago. His smoking use included Cigarettes. He has a 60 pack-year smoking history. He has never used smokeless tobacco. He reports that he does not drink alcohol or use illicit drugs.   ROS:  Please see the history of present illness.  Denies any bleeding, syncope, orthopnea, PND.  All other systems reviewed and negative.   PHYSICAL EXAM: VS:  BP 143/78  Pulse 66  Ht 5\' 9"  (1.753 m)  Wt 208 lb 1.9 oz (94.403 kg)  BMI 30.72 kg/m2 Well nourished, well developed, in no acute distress HEENT: normal Neck: no JVD Cardiac:  normal S1, S2; RRR; no murmur Lungs:  clear to auscultation bilaterally, no wheezing, rhonchi or rales Abd: soft, nontender, no hepatomegaly Ext: no edema Skin: warm and dry Neuro: no focal abnormalities noted     EKG: 04/07/13-normal sinus rhythm, 89, nonspecific ST-T wave changes  ASSESSMENT AND PLAN:  1. Coronary artery disease status post LAD stent-currently stable, no exertional anginal symptoms. Continuing with secondary prevention efforts. Promoting diet, exercise. 2. Hyperlipidemia-has had some issues with statins in the past. His medication list shows pravastatin 10 mg. We have tried atorvastatin in the past. He recently has run out. I restarting atorvastatin 20 mg. Past lipid/LDL 147. Lipid profile with atorvastatin is 96. Goal 70.  3. Hypertension-currently stable, no changes made to medications. Diet, exercise. Medications reviewed. 4. We will see him back in 6 months  Signed, Candee Furbish, MD [facility]  10/14/2013 1:36 PM

## 2013-10-14 NOTE — Patient Instructions (Signed)
Your physician recommends that you continue on your current medications as directed. Please refer to the Current Medication list given to you today.  Your physician wants you to follow-up in: 6 monhs You will receive a reminder letter in the mail two months in advance. If you don't receive a letter, please call our office to schedule the follow-up appointment.

## 2013-12-24 ENCOUNTER — Telehealth: Payer: Self-pay | Admitting: Cardiology

## 2013-12-24 NOTE — Telephone Encounter (Signed)
New problem   Pt was having occasional skip heart beats. Pt is going out of town tomorrow morning and need to speak to someone today. Please call pt.

## 2013-12-24 NOTE — Telephone Encounter (Signed)
Spoke with patient and he has been having some skipped heart beats in the evening after he eats for about a week. Patient denies fast heart rate with episodes. Patients denies shortness of breath, chest pains, or any other symptoms. Does not happen daily and notices more after eating chocolate ice cream. Patient states he only drinks caffeine in the morning (coffee). Does work outside in heat all day. A few days ago he knows he did not drink enough water. Patient does work out daily and blood pressure good 120's/60's. Advised patient that he needed to avoid caffeine and increase his water intake. If he should develop any chest pains or shortness of breath to go to ED. Will forward to Dr Marlou Porch for review/recommendations. Patient aware will be next week before call back.

## 2013-12-28 NOTE — Telephone Encounter (Signed)
Seems to be associated with his evening meal. They do seem to be getting better, last night none. Patient is currently taking Metoprolol Tart 25 mg 1/2 tablet twice a day. He will continue to monitor and call back if no better or worse.

## 2013-12-28 NOTE — Telephone Encounter (Signed)
If his symptoms of palpitations continue, we could trial a low dose of beta blocker such as metoprolol. For now, continue to monitor.

## 2014-01-05 ENCOUNTER — Telehealth: Payer: Self-pay | Admitting: Cardiology

## 2014-01-05 NOTE — Telephone Encounter (Signed)
Pt aware ok to have teeth removed.

## 2014-01-05 NOTE — Telephone Encounter (Signed)
Will forward to Dr Skains for his review °

## 2014-01-05 NOTE — Telephone Encounter (Signed)
OK to proceed.  Dennis Furbish, MD

## 2014-01-05 NOTE — Telephone Encounter (Signed)
New message     Pt has a dental appt tomorrow to have 8 teeth pulled.  Will this be ok with Dr Genia Del?

## 2014-01-13 DIAGNOSIS — Z0000 Encounter for general adult medical examination without abnormal findings: Secondary | ICD-10-CM | POA: Diagnosis not present

## 2014-01-13 DIAGNOSIS — Z79899 Other long term (current) drug therapy: Secondary | ICD-10-CM | POA: Diagnosis not present

## 2014-01-13 DIAGNOSIS — Z1331 Encounter for screening for depression: Secondary | ICD-10-CM | POA: Diagnosis not present

## 2014-01-13 DIAGNOSIS — Z6830 Body mass index (BMI) 30.0-30.9, adult: Secondary | ICD-10-CM | POA: Diagnosis not present

## 2014-01-13 DIAGNOSIS — Z23 Encounter for immunization: Secondary | ICD-10-CM | POA: Diagnosis not present

## 2014-01-13 DIAGNOSIS — E669 Obesity, unspecified: Secondary | ICD-10-CM | POA: Diagnosis not present

## 2014-01-13 DIAGNOSIS — I10 Essential (primary) hypertension: Secondary | ICD-10-CM | POA: Diagnosis not present

## 2014-01-13 DIAGNOSIS — R002 Palpitations: Secondary | ICD-10-CM | POA: Diagnosis not present

## 2014-03-23 DIAGNOSIS — M19079 Primary osteoarthritis, unspecified ankle and foot: Secondary | ICD-10-CM | POA: Diagnosis not present

## 2014-03-23 DIAGNOSIS — M25569 Pain in unspecified knee: Secondary | ICD-10-CM | POA: Diagnosis not present

## 2014-04-27 ENCOUNTER — Ambulatory Visit (INDEPENDENT_AMBULATORY_CARE_PROVIDER_SITE_OTHER): Payer: Medicare Other | Admitting: Cardiology

## 2014-04-27 ENCOUNTER — Encounter: Payer: Self-pay | Admitting: Cardiology

## 2014-04-27 VITALS — BP 142/82 | HR 58 | Ht 69.0 in | Wt 205.0 lb

## 2014-04-27 DIAGNOSIS — R002 Palpitations: Secondary | ICD-10-CM

## 2014-04-27 DIAGNOSIS — I2510 Atherosclerotic heart disease of native coronary artery without angina pectoris: Secondary | ICD-10-CM

## 2014-04-27 DIAGNOSIS — E785 Hyperlipidemia, unspecified: Secondary | ICD-10-CM

## 2014-04-27 DIAGNOSIS — I10 Essential (primary) hypertension: Secondary | ICD-10-CM | POA: Diagnosis not present

## 2014-04-27 DIAGNOSIS — I2583 Coronary atherosclerosis due to lipid rich plaque: Principal | ICD-10-CM

## 2014-04-27 MED ORDER — ATORVASTATIN CALCIUM 20 MG PO TABS: 20.0000 mg | ORAL_TABLET | Freq: Every day | ORAL | Status: DC

## 2014-04-27 NOTE — Progress Notes (Signed)
[facility]. [address] [place], [place]  [address] Phone: [phone] Fax:  [phone]  Date:  04/27/2014   ID:  Rune Mendez, DOB [birth year], MRN [id number]  PCP:  Mathews Argyle, MD   History of Present Illness: Dennis York is a 73 y.o. male with coronary artery disease status post stent to LAD in 1994 with hypertension, hyperlipidemia, gout, former smoker here for followup.  Main problem are PVC's at night, while relaxing. He's done a lot of reading on this. He is not interested in increasing his beta blocker at this time.  Nuclear stress test 2013 was low risk but no ischemia.  In the past, has had issues with alcohol, rehabilitation efforts. He's been compliant with his medications. Occasionally feels tachycardic/palpitations/anxiety.  In regards to coronary artery disease, angioplasty nearly 20 years ago and having no exertional symptoms.  He is had issues in the past with some cholesterol medications. Pravastatin, low-dose has been taken in the past, LDL in October of 2013 was 165. I made the switch for him to take atorvastatin at that time. LDL 96 from 147.  Surf fishing enjoys. [employer] 3 days a week. Vigorous exercise. Doing well. No anginal symptoms.  He did ask me why he did not do a stress test every year. Last stress test in 2013 was low risk. I explained to him the risks and benefits of serial testing.    Wt Readings from Last 3 Encounters:  04/27/14 205 lb (92.987 kg)  10/14/13 208 lb 1.9 oz (94.403 kg)  04/07/13 205 lb (92.987 kg)     Past Medical History  Diagnosis Date  . Alcohol abuse, in remission   . Hyperlipidemia   . HTN (hypertension)   . Anxiety   . Normal nuclear stress test     04/2012 [facility]  . Depression   . Complication of anesthesia     allergic reactions to Barbituates  . CAD (coronary artery disease)     Prior PCI to LAD in 1994 sees Dr Etter Sjogren  . Kidney stones     hx of   . GERD (gastroesophageal  reflux disease)   . H/O hiatal hernia   . Swelling of both ankles     "if I'm on my feet alot or flying to [place] to see daughter" (05/01/2012)  . Lower GI bleeding     "had colonoscopy; it was internal  hemorrhoids"   . Arthritis     "hands, wrists, ankle on right" (05/01/2012)  . Hypocholesterolemia   . Left knee DJD   . Hiatal hernia   . H/O macrocytic anemia 10/2011    , Mild, Hemoglobin 12.5  . Vitamin B 12 deficiency 10/2011    , Start oral replacement  . Iron deficiency   . SIADH (syndrome of inappropriate ADH production)     Past Surgical History  Procedure Laterality Date  . Replacement total knee  08/2008; 04/30/2012    right; left  . US echocardiography  09/04/2005    EF 55-60%  . Joint replacement    . Cardiovascular stress test    . Eye surgery      bilateral cataracts removed 2013  . Tonsillectomy  1946  . Minor hemorrhoidectomy  ~ 1960  . Knee arthroscopy  ~ 2001    left  . Cataract extraction w/ intraocular lens  implant, bilateral  ~ 2010; 03/2012    right; left  . Cardiac catheterization  01/30/1993    EF  55-60%  . Coronary angioplasty  1990's  . Total knee arthroplasty  04/30/2012    Procedure: TOTAL KNEE ARTHROPLASTY;  Surgeon: Ninetta Lights, MD;  Location: [facility];  Service: Orthopedics;  Laterality: Left;  left total knee arthroplasty    Current Outpatient Prescriptions  Medication Sig Dispense Refill  . amLODipine (NORVASC) 5 MG tablet Take 1 tablet (5 mg total) by mouth daily. For control of high blood pressure  30 tablet  6  . aspirin 81 MG tablet Take 81 mg by mouth daily.      Marland Kitchen atorvastatin (LIPITOR) 20 MG tablet Take 20 mg by mouth daily.      . metoprolol tartrate (LOPRESSOR) 25 MG tablet Take 0.5 tablets (12.5 mg total) by mouth 2 (two) times daily. For control of high blood pressure  30 tablet  0  . omeprazole (PRILOSEC OTC) 20 MG tablet Take 20 mg by mouth as needed. For control of stomach acid secretion and helps GERD.       No current  facility-administered medications for this visit.    Allergies:    Allergies  Allergen Reactions  . Ativan [Lorazepam] Other (See Comments)    ATAXIA LASTING FOR SEVERAL DAYS  . Barbiturates Swelling and Other (See Comments)    "skin on head of penis came off; raw meat"  . Bee Venom Anaphylaxis    Patient carries an epi pen  . Penicillins Anaphylaxis  . Zetia [Ezetimibe]   . Statins Anxiety    Social History:  The patient  reports that he quit smoking about 19 years ago. His smoking use included Cigarettes. He has a 60 pack-year smoking history. He has never used smokeless tobacco. He reports that he does not drink alcohol or use illicit drugs.   ROS:  Please see the history of present illness.  Denies any bleeding, syncope, orthopnea, PND.  All other systems reviewed and negative.   PHYSICAL EXAM: VS:  BP 142/82  Pulse 58  Ht 5\' 9"  (1.753 m)  Wt 205 lb (92.987 kg)  BMI 30.26 kg/m2 Well nourished, well developed, in no acute distress HEENT: normal Neck: no JVD Cardiac:  normal S1, S2; RRR; no murmur Lungs:  clear to auscultation bilaterally, no wheezing, rhonchi or rales Abd: soft, nontender, no hepatomegaly Ext: no edema Skin: warm and dry2+ DP right ankle brace. Neuro: no focal abnormalities noted     EKG: 04/07/13-normal sinus rhythm, 89, nonspecific ST-T wave changes Labs: 10/06/13 -Total cholesterol 154, LDL 96, HDL 39  ASSESSMENT AND PLAN:  1. Coronary artery disease status post LAD stent-currently stable, no exertional anginal symptoms. Continuing with secondary prevention efforts. Promoting diet, exercise. 2. Palpitations - PVC at night. He states that after stopping his Prilosec which he read on the Internet can be a cause of palpitations but this seemed to help. 3. Hyperlipidemia-has had some issues with statins in the past. His medication list shows pravastatin 10 mg. We have tried atorvastatin in the past. He recently has run out. I restarting atorvastatin 20 mg.  Past lipid/LDL 147. Lipid profile with atorvastatin is 96. Goal 70.  4. Hypertension-currently stable, no changes made to medications. Diet, exercise. Medications reviewed. 5. First-degree AV block-mild. On low-dose beta blocker. If PVCs worsen, I would consider increasing low-dose beta blocker and monitoring his first degree AV block. 6. We will see him back in 6 months  Signed, Candee Furbish, MD [facility]  04/27/2014 9:09 AM

## 2014-04-27 NOTE — Patient Instructions (Signed)
The current medical regimen is effective;  continue present plan and medications.  Follow up in 6 months with Dr. Skains.  You will receive a letter in the mail 2 months before you are due.  Please call us when you receive this letter to schedule your follow up appointment.  

## 2014-04-29 DIAGNOSIS — Z23 Encounter for immunization: Secondary | ICD-10-CM | POA: Diagnosis not present

## 2014-05-12 ENCOUNTER — Encounter: Payer: Self-pay | Admitting: Cardiology

## 2014-05-23 ENCOUNTER — Other Ambulatory Visit: Payer: Self-pay | Admitting: Cardiology

## 2014-07-09 ENCOUNTER — Other Ambulatory Visit: Payer: Self-pay | Admitting: *Deleted

## 2014-07-09 MED ORDER — AMLODIPINE BESYLATE 5 MG PO TABS: 5.0000 mg | ORAL_TABLET | Freq: Every day | ORAL | Status: DC

## 2014-08-17 DIAGNOSIS — E780 Pure hypercholesterolemia: Secondary | ICD-10-CM | POA: Diagnosis not present

## 2014-08-17 DIAGNOSIS — Z79899 Other long term (current) drug therapy: Secondary | ICD-10-CM | POA: Diagnosis not present

## 2014-10-26 ENCOUNTER — Ambulatory Visit (INDEPENDENT_AMBULATORY_CARE_PROVIDER_SITE_OTHER): Payer: Medicare Other | Admitting: Cardiology

## 2014-10-26 ENCOUNTER — Encounter: Payer: Self-pay | Admitting: Cardiology

## 2014-10-26 VITALS — BP 122/62 | HR 63 | Ht 69.0 in | Wt 196.4 lb

## 2014-10-26 DIAGNOSIS — E785 Hyperlipidemia, unspecified: Secondary | ICD-10-CM | POA: Diagnosis not present

## 2014-10-26 DIAGNOSIS — I2583 Coronary atherosclerosis due to lipid rich plaque: Secondary | ICD-10-CM

## 2014-10-26 DIAGNOSIS — I2510 Atherosclerotic heart disease of native coronary artery without angina pectoris: Secondary | ICD-10-CM | POA: Diagnosis not present

## 2014-10-26 DIAGNOSIS — I10 Essential (primary) hypertension: Secondary | ICD-10-CM | POA: Diagnosis not present

## 2014-10-26 DIAGNOSIS — Z79899 Other long term (current) drug therapy: Secondary | ICD-10-CM

## 2014-10-26 LAB — LIPID PANEL
Cholesterol: 210 mg/dL — ABNORMAL HIGH (ref 0–200)
HDL: 38.4 mg/dL — ABNORMAL LOW (ref >39.00)
LDL Cholesterol: 139 mg/dL — ABNORMAL HIGH (ref 0–99)
NONHDL: 171.6
Total CHOL/HDL Ratio: 5
Triglycerides: 163 mg/dL — ABNORMAL HIGH (ref 0.0–149.0)
VLDL: 32.6 mg/dL (ref 0.0–40.0)

## 2014-10-26 LAB — ALT: ALT: 16 U/L (ref 0–53)

## 2014-10-26 NOTE — Addendum Note (Signed)
Addended by: Eulis Foster on: 10/26/2014 03:58 PM   Modules accepted: Orders

## 2014-10-26 NOTE — Progress Notes (Signed)
[facility]. [address], [address] [place], [place]  [address] Phone: [phone] Fax:  [phone]  Date:  10/26/2014   ID:  Dennis York, DOB 1941-06-10, MRN [id number]  PCP:  Mathews Argyle, MD   History of Present Illness: Dennis York is a 74 y.o. male with coronary artery disease status post stent to LAD in 1994 with hypertension, hyperlipidemia, gout, former smoker here for followup. Former patient of Dr. Doreatha Lew.   Nuclear stress test 2013 was low risk but no ischemia.  In the past, has had issues with alcohol, rehabilitation efforts. He's been compliant with his medications. Occasionally feels tachycardic/palpitations/anxiety.  In regards to coronary artery disease, angioplasty nearly 20 years ago and having no exertional symptoms.  He is had issues in the past with some cholesterol medications. Pravastatin, low-dose has been taken in the past, LDL in October of 2013 was 165. I made the switch for him to take atorvastatin at that time. LDL 96 from 147.  Surf fishing enjoys. [employer] 3 days a week. Vigorous exercise. Doing well. No anginal symptoms. [place]im[place] the hills in [place] without any problems.  At a prior visit, he did ask me why he did not do a stress test every year. Last stress test in 2013 was low risk. I explained to him the risks and benefits of serial testing.    Wt Readings from Last 3 Encounters:  10/26/14 196 lb 6.4 oz (89.086 kg)  04/27/14 205 lb (92.987 kg)  10/14/13 208 lb 1.9 oz (94.403 kg)     Past Medical History  Diagnosis Date  . Alcohol abuse, in remission   . Hyperlipidemia   . HTN (hypertension)   . Anxiety   . Normal nuclear stress test     04/2012 [facility]  . Depression   . Complication of anesthesia     allergic reactions to Barbituates  . CAD (coronary artery disease)     Prior PCI to LAD in 1994 sees Dr Etter Sjogren  . Kidney stones     hx of   . GERD (gastroesophageal reflux disease)   . H/O hiatal hernia   .  Swelling of both ankles     "if I'm on my feet alot or flying to [place] to [place]" (05/01/2012)  . Lower GI bleeding     "had colonoscopy; it was internal  hemorrhoids"   . Arthritis     "hands, wrists, ankle on right" (05/01/2012)  . Hypocholesterolemia   . Left knee DJD   . Hiatal hernia   . H/O macrocytic anemia 10/2011    , Mild, Hemoglobin 12.5  . Vitamin B 12 deficiency 10/2011    , Start oral replacement  . Iron deficiency   . SIADH (syndrome of inappropriate ADH production)     Past Surgical History  Procedure Laterality Date  . Replacement total knee  08/2008; 04/30/2012    right; left  . US echocardiography  09/04/2005    EF 55-60%  . Joint replacement    . Cardiovascular stress test    . Eye surgery      bilateral cataracts removed 2013  . Tonsillectomy  1946  . Minor hemorrhoidectomy  ~ 1960  . Knee arthroscopy  ~ 2001    left  . Cataract extraction w/ intraocular lens  implant, bilateral  ~ 2010; 03/2012    right; left  . Cardiac catheterization  01/30/1993    EF 55-60%  . Coronary angioplasty  1990's  .  Total knee arthroplasty  04/30/2012    Procedure: TOTAL KNEE ARTHROPLASTY;  Surgeon: Ninetta Lights, MD;  Location: [facility];  Service: Orthopedics;  Laterality: Left;  left total knee arthroplasty    Current Outpatient Prescriptions  Medication Sig Dispense Refill  . amLODipine (NORVASC) 5 MG tablet Take 1 tablet (5 mg total) by mouth daily. for blood pressure 30 tablet 3  . aspirin 81 MG tablet Take 81 mg by mouth daily.    Marland Kitchen atorvastatin (LIPITOR) 20 MG tablet Take 1 tablet (20 mg total) by mouth daily. 30 tablet 11  . metoprolol tartrate (LOPRESSOR) 25 MG tablet Take 0.5 tablets (12.5 mg total) by mouth 2 (two) times daily. For control of high blood pressure 30 tablet 0  . omeprazole (PRILOSEC OTC) 20 MG tablet Take 20 mg by mouth as needed. For control of stomach acid secretion and helps GERD.     No current facility-administered medications for this  visit.    Allergies:    Allergies  Allergen Reactions  . Ativan [Lorazepam] Other (See Comments)    ATAXIA LASTING FOR SEVERAL DAYS  . Barbiturates Swelling and Other (See Comments)    "skin on head of penis came off; raw meat"  . Bee Venom Anaphylaxis    Patient carries an epi pen  . Penicillins Anaphylaxis  . Zetia [Ezetimibe]   . Statins Anxiety    Social History:  The patient  reports that he quit smoking about 19 years ago. His smoking use included Cigarettes. He has a 60 pack-year smoking history. He has never used smokeless tobacco. He reports that he does not drink alcohol or use illicit drugs.   ROS:  Please see the history of present illness.  Denies any bleeding, syncope, orthopnea, PND.  All other systems reviewed and negative.   PHYSICAL EXAM: VS:  BP 122/62 mmHg  Pulse 63  Ht 5\' 9"  (1.753 m)  Wt 196 lb 6.4 oz (89.086 kg)  BMI 28.99 kg/m2 Well nourished, well developed, in no acute distress HEENT: normal Neck: no JVD Cardiac:  normal S1, S2; RRR; no murmur Lungs:  clear to auscultation bilaterally, no wheezing, rhonchi or rales Abd: soft, nontender, no hepatomegaly Ext: no edema Skin: warm and dry2+ DP right ankle brace. Neuro: no focal abnormalities noted, mildly anxious     EKG: 04/07/13-normal sinus rhythm, 89, nonspecific ST-T wave changes Labs: 10/06/13 -Total cholesterol 154, LDL 96, HDL 39  ASSESSMENT AND PLAN:  1. Coronary artery disease status post LAD stent-currently stable, no exertional anginal symptoms. Continuing with secondary prevention efforts. Promoting diet, exercise. He is quite active, walks the hills of [place]. Has not had any anginal symptoms. Had lengthy discussion about future risks of heart attack. He understands that this may happen because of his prior CAD history. 2. Palpitations - at previous visit, PVC at night. He states that after stopping his Prilosec which he read on the Internet can be a cause of palpitations but this  seemed to help. 3. Hyperlipidemia-has had some issues with statins in the past. His medication list shows pravastatin 10 mg. We have tried atorvastatin in the past. He recently has run out. I restarting atorvastatin 20 mg. Past lipid/LDL 147. Lipid profile with atorvastatin is 96. Goal 70.  4. Hypertension-currently stable, no changes made to medications. Diet, exercise. Medications reviewed. 5. First-degree AV block-mild. On low-dose beta blocker. If PVCs worsen, I would consider increasing low-dose beta blocker and monitoring his first degree AV block. 6. We will see  him back in 6 months  Signed, Candee Furbish, MD [facility]  10/26/2014 3:36 PM

## 2014-10-26 NOTE — Patient Instructions (Signed)
Medication Instructions:  Your physician recommends that you continue on your current medications as directed. Please refer to the Current Medication list given to you today.  Labwork: Lipid and ALT today  Testing/Procedures: NONE  Follow-Up: Follow up in 6 months with Dr. Marlou Porch.  You will receive a letter in the mail 2 months before you are due.  Please call us when you receive this letter to schedule your follow up appointment.  Thank you for choosing [facility]!!

## 2014-10-28 ENCOUNTER — Telehealth: Payer: Self-pay | Admitting: Cardiology

## 2014-10-28 DIAGNOSIS — Z79899 Other long term (current) drug therapy: Secondary | ICD-10-CM

## 2014-10-28 DIAGNOSIS — E7800 Pure hypercholesterolemia, unspecified: Secondary | ICD-10-CM

## 2014-10-28 MED ORDER — ATORVASTATIN CALCIUM 40 MG PO TABS: 40.0000 mg | ORAL_TABLET | Freq: Every day | ORAL | Status: DC

## 2014-10-28 NOTE — Telephone Encounter (Signed)
Reviewed results of lipid and ALT.  Pt is aware of orders and RX will be sent into [facility].  Pt will repeat lab in 3 months.

## 2014-10-28 NOTE — Telephone Encounter (Signed)
Follow Up    Pt calling regarding test results. Please call.

## 2015-02-25 DIAGNOSIS — M19032 Primary osteoarthritis, left wrist: Secondary | ICD-10-CM | POA: Diagnosis not present

## 2015-02-25 DIAGNOSIS — M25532 Pain in left wrist: Secondary | ICD-10-CM | POA: Diagnosis not present

## 2015-04-05 DIAGNOSIS — R079 Chest pain, unspecified: Secondary | ICD-10-CM | POA: Diagnosis not present

## 2015-04-06 ENCOUNTER — Telehealth: Payer: Self-pay | Admitting: Cardiology

## 2015-04-06 NOTE — Telephone Encounter (Signed)
New Message      [place] and went to a doctor there and was told he needed a stress test and to see his cardiologist as soon as possible. I informed the pt that Dr. Marlou Porch first available appt was 04/20/15 but that I could look to see if there was something sooner with a PA. Pt started yelling at saying "that's bullshit and you're not going to give me a hard time about seeing my cardiologist" I informed the pt that I wasn't giving him a hard time but that I was telling him what was available that I could schedule and asked if he could refrain from Normal. Pt states, "I will say whatever the hell I want, I need to see a doctor, if it isn't my doctor, I need to see another doctor today." Please call pt back and advise.

## 2015-04-06 NOTE — Telephone Encounter (Signed)
Left message to call back  

## 2015-04-07 DIAGNOSIS — R9439 Abnormal result of other cardiovascular function study: Secondary | ICD-10-CM | POA: Diagnosis not present

## 2015-04-08 NOTE — Telephone Encounter (Signed)
LM to call back in follow up from call 10/5.

## 2015-04-18 NOTE — Telephone Encounter (Signed)
Pt has an appt 10/18 with Dr Marlou Porch

## 2015-04-19 ENCOUNTER — Encounter: Payer: Self-pay | Admitting: *Deleted

## 2015-04-19 ENCOUNTER — Ambulatory Visit (INDEPENDENT_AMBULATORY_CARE_PROVIDER_SITE_OTHER): Payer: Medicare Other | Admitting: Cardiology

## 2015-04-19 ENCOUNTER — Encounter: Payer: Self-pay | Admitting: Cardiology

## 2015-04-19 ENCOUNTER — Telehealth: Payer: Self-pay | Admitting: Cardiology

## 2015-04-19 VITALS — BP 138/74 | HR 61 | Ht 66.0 in | Wt 193.6 lb

## 2015-04-19 DIAGNOSIS — Z01818 Encounter for other preprocedural examination: Secondary | ICD-10-CM

## 2015-04-19 DIAGNOSIS — R9439 Abnormal result of other cardiovascular function study: Secondary | ICD-10-CM

## 2015-04-19 DIAGNOSIS — I2583 Coronary atherosclerosis due to lipid rich plaque: Secondary | ICD-10-CM

## 2015-04-19 DIAGNOSIS — R931 Abnormal findings on diagnostic imaging of heart and coronary circulation: Secondary | ICD-10-CM | POA: Diagnosis not present

## 2015-04-19 DIAGNOSIS — I10 Essential (primary) hypertension: Secondary | ICD-10-CM | POA: Diagnosis not present

## 2015-04-19 DIAGNOSIS — I208 Other forms of angina pectoris: Secondary | ICD-10-CM | POA: Diagnosis not present

## 2015-04-19 DIAGNOSIS — I2510 Atherosclerotic heart disease of native coronary artery without angina pectoris: Secondary | ICD-10-CM | POA: Diagnosis not present

## 2015-04-19 LAB — CBC
HCT: 39.3 % (ref 39.0–52.0)
Hemoglobin: 13.3 g/dL (ref 13.0–17.0)
MCH: 31.8 pg (ref 26.0–34.0)
MCHC: 33.8 g/dL (ref 30.0–36.0)
MCV: 94 fL (ref 78.0–100.0)
MPV: 9.6 fL (ref 8.6–12.4)
PLATELETS: 280 10*3/uL (ref 150–400)
RBC: 4.18 MIL/uL — AB (ref 4.22–5.81)
RDW: 14 % (ref 11.5–15.5)
WBC: 8.1 10*3/uL (ref 4.0–10.5)

## 2015-04-19 LAB — BASIC METABOLIC PANEL
BUN: 13 mg/dL (ref 7–25)
CALCIUM: 9.4 mg/dL (ref 8.6–10.3)
CO2: 21 mmol/L (ref 20–31)
Chloride: 102 mmol/L (ref 98–110)
Creat: 0.95 mg/dL (ref 0.70–1.18)
GLUCOSE: 89 mg/dL (ref 65–99)
Potassium: 4.1 mmol/L (ref 3.5–5.3)
Sodium: 133 mmol/L — ABNORMAL LOW (ref 135–146)

## 2015-04-19 NOTE — Progress Notes (Signed)
[facility]. [address], [place]  [address] Phone: [phone] Fax:  [phone]  Date:  04/19/2015   ID:  Dennis York, DOB 10/04/1940, MRN [id number]  PCP:  Mathews Argyle, MD   History of Present Illness: Dennis York is a 74 y.o. male with coronary artery disease status post stent to LAD in 1994 with hypertension, hyperlipidemia, gout, former smoker here for followup. Former patient of Dr. Doreatha Lew.   Nuclear stress test 2013 was low risk but no ischemia.  In the past, has had issues with alcohol, rehabilitation efforts. He's been compliant with his medications. Occasionally feels tachycardic/palpitations/anxiety.  In regards to coronary artery disease, angioplasty nearly 20 years ago and having no exertional symptoms until recently.  He is had issues in the past with some cholesterol medications. Pravastatin, low-dose has been taken in the past, LDL in October of 2013 was 165. I made the switch for him to take atorvastatin at that time. LDL 96 from 147.  Surf fishing enjoys. [employer] 3 days a week. Vigorous exercise. Previously Climbs the hills in [place] without any problems.  At a prior visit, he did ask me why he did not do a stress test every year. Last stress test in 2013 was low risk. I explained to him the risks and benefits of serial testing.   04/19/15 - GYM on bike for 25 min. Hard work out. Noted some tightness across chest. Went back to gym next day. Angina returned. Had evaluation done with cardiologist in [place]. Had stress test done. ST segments are depressed 43mm. Had NUC portion. Mid to apical defect in the LAD territory noticed. Cardiac catheterization was recommended. He has stopped performing any heavy exertional activity.   Wt Readings from Last 3 Encounters:  04/19/15 193 lb 9.6 oz (87.816 kg)  10/26/14 196 lb 6.4 oz (89.086 kg)  04/27/14 205 lb (92.987 kg)     Past Medical History  Diagnosis Date  . Alcohol abuse, in  remission   . Hyperlipidemia   . HTN (hypertension)   . Anxiety   . Normal nuclear stress test     04/2012 [facility]  . Depression   . Complication of anesthesia     allergic reactions to Barbituates  . CAD (coronary artery disease)     Prior PCI to LAD in 1994 sees Dr Etter Sjogren  . Kidney stones     hx of   . GERD (gastroesophageal reflux disease)   . H/O hiatal hernia   . Swelling of both ankles     "if I'm on my feet alot or flying to [place] to see daughter" (05/01/2012)  . Lower GI bleeding     "had colonoscopy; it was internal  hemorrhoids"   . Arthritis     "hands, wrists, ankle on right" (05/01/2012)  . Hypocholesterolemia   . Left knee DJD   . Hiatal hernia   . H/O macrocytic anemia 10/2011    , Mild, Hemoglobin 12.5  . Vitamin B 12 deficiency 10/2011    , Start oral replacement  . Iron deficiency   . SIADH (syndrome of inappropriate ADH production) [facility])     Past Surgical History  Procedure Laterality Date  . Replacement total knee  08/2008; 04/30/2012    right; left  . US echocardiography  09/04/2005    EF 55-60%  . Joint replacement    . Cardiovascular stress test    . Eye surgery  bilateral cataracts removed 2013  . Tonsillectomy  1946  . Minor hemorrhoidectomy  ~ 1960  . Knee arthroscopy  ~ 2001    left  . Cataract extraction w/ intraocular lens  implant, bilateral  ~ 2010; 03/2012    right; left  . Cardiac catheterization  01/30/1993    EF 55-60%  . Coronary angioplasty  1990's  . Total knee arthroplasty  04/30/2012    Procedure: TOTAL KNEE ARTHROPLASTY;  Surgeon: Ninetta Lights, MD;  Location: [facility];  Service: Orthopedics;  Laterality: Left;  left total knee arthroplasty    Current Outpatient Prescriptions  Medication Sig Dispense Refill  . amLODipine (NORVASC) 5 MG tablet Take 1 tablet (5 mg total) by mouth daily. for blood pressure 30 tablet 3  . aspirin 81 MG tablet Take 81 mg by mouth daily.    Marland Kitchen atorvastatin (LIPITOR) 40 MG tablet  Take 1 tablet (40 mg total) by mouth daily. 30 tablet 6  . metoprolol tartrate (LOPRESSOR) 25 MG tablet Take 0.5 tablets (12.5 mg total) by mouth 2 (two) times daily. For control of high blood pressure 30 tablet 0  . nitroGLYCERIN (NITROSTAT) 0.4 MG SL tablet      No current facility-administered medications for this visit.    Allergies:    Allergies  Allergen Reactions  . Ativan [Lorazepam] Other (See Comments)    ATAXIA LASTING FOR SEVERAL DAYS  . Barbiturates Swelling and Other (See Comments)    "skin on head of penis came off; raw meat"  . Bee Venom Anaphylaxis    Patient carries an epi pen  . Penicillins Anaphylaxis  . Zetia [Ezetimibe]   . Statins Anxiety    Social History:  The patient  reports that he quit smoking about 20 years ago. His smoking use included Cigarettes. He has a 60 pack-year smoking history. He has never used smokeless tobacco. He reports that he does not drink alcohol or use illicit drugs.   ROS:  Please see the history of present illness.  Denies any bleeding, syncope, orthopnea, PND.  All other systems reviewed and negative.   PHYSICAL EXAM: VS:  BP 138/74 mmHg  Pulse 61  Ht 5\' 6"  (1.676 m)  Wt 193 lb 9.6 oz (87.816 kg)  BMI 31.26 kg/m2 Well nourished, well developed, in no acute distress HEENT: normal Neck: no JVD Cardiac:  normal S1, S2; RRR; no murmur Lungs:  clear to auscultation bilaterally, no wheezing, rhonchi or rales Abd: soft, nontender, no hepatomegaly Ext: no edema Skin: warm and dry2+ DP right ankle brace. Neuro: no focal abnormalities noted, mildly anxious     EKG: Today 04/19/15-sinus rhythm, first-degree AV block, 228 ms. 04/07/13-normal sinus rhythm, 89, nonspecific ST-T wave changes Labs: 10/06/13 -Total cholesterol 154, LDL 96, HDL 39  ASSESSMENT AND PLAN:  1. Abnormal nuclear stress test moderate-sized mid to distal anterior apical reversible perfusion defect suggestive of ischemia in the LAD territory. 3 times a day was 0.89.  Normal. LVEF was 64%. He had 2 mm of ST segment depression on his treadmill test. Inferior leads. Heart catheterization was recommended at the place of nuclear stress test, Isidore Moos, [place] [facility]. Based upon his exertional anginal symptoms, we will go ahead and proceed with cardiac catheterization. Risks and benefits discussed including stroke, heart attack, death, renal impairment. Radial approach. 2. Coronary artery disease status post LAD stent- exertional anginal symptoms noted. Proceeding with heart catheterization. Continuing with secondary prevention efforts. Promoting diet, exercise. He is quite active, walks the hills of  [place].  Had lengthy discussion about future risks of heart attack. He understands that this may happen because of his prior CAD history. 3. Palpitations - at previous visit, PVC at night. He states that after stopping his Prilosec which he read on the Internet can be a cause of palpitations but this seemed to help. 4. Hyperlipidemia-has had some issues with statins in the past. His medication list shows pravastatin 10 mg. We have tried atorvastatin in the past. He recently has run out. I have restarted atorvastatin 20 mg. Past lipid/LDL 147. Lipid profile with atorvastatin is 96. Goal 70.  5. Hypertension-currently stable, no changes made to medications. Diet, exercise. Medications reviewed. 6. First-degree AV block-mild. On low-dose beta blocker. If PVCs worsen, I would consider increasing low-dose beta blocker and monitoring his first degree AV block. 7. We will see him back post catheterization  Cath Lab Visit (complete for each Cath Lab visit)  Clinical Evaluation Leading to the Procedure:   ACS: No.  Non-ACS:    Anginal Classification: CCS III  Anti-ischemic medical therapy: Minimal Therapy (1 class of medications)  Non-Invasive Test Results: High-risk stress test findings: cardiac mortality >3%/year  Prior CABG: No previous  CABG     Signed, Candee Furbish, MD [facility]  04/19/2015 2:38 PM

## 2015-04-19 NOTE — Patient Instructions (Signed)
Medication Instructions:  The current medical regimen is effective;  continue present plan and medications.  Labwork: Please have blood work today (BMP,CBC and INR)  Testing/Procedures: Your physician has requested that you have a cardiac catheterization. Cardiac catheterization is used to diagnose and/or treat various heart conditions. Doctors may recommend this procedure for a number of different reasons. The most common reason is to evaluate chest pain. Chest pain can be a symptom of coronary artery disease (CAD), and cardiac catheterization can show whether plaque is narrowing or blocking your heart's arteries. This procedure is also used to evaluate the valves, as well as measure the blood flow and oxygen levels in different parts of your heart. For further information please visit [email or web address]. Please follow instruction sheet, as given.  Follow-Up: Follow up with Dr Marlou Porch after your heart cath.  Thank you for choosing [facility]!!

## 2015-04-19 NOTE — Telephone Encounter (Signed)
Will review with Dr Skains and call pt back 

## 2015-04-19 NOTE — Telephone Encounter (Signed)
New Message    Pt wants to know if he can have surgery before or after the date that is scheduled  He wants to know what are the dates of surgery times that he could possibly switch   To before he decide to cancel   Please call him as soon as possible

## 2015-04-20 ENCOUNTER — Telehealth: Payer: Self-pay

## 2015-04-20 LAB — PROTIME-INR
INR: 1.05 (ref <1.50)
PROTHROMBIN TIME: 13.8 s (ref 11.6–15.2)

## 2015-04-20 NOTE — Telephone Encounter (Signed)
Spoke with Dr. Marlou Porch and he said that he would like for pt to proceed as scheduled due to intermediate to high risk stress test. Spoke with pt and advised him of information from Dr. Marlou Porch. Pt agreeable to proceed with cath as scheduled on 10/21.

## 2015-04-20 NOTE — Telephone Encounter (Signed)
Called patient informing him that nurse sent request to Dr. Kingsley Plan for advisement. Patient wants to make sure that he is ok to reschedule within a reasonable amount of time.   He wants to make sure that Dr. Kingsley Plan feels that pt is safe enough to wait. He states that if not, he will keep 10/21 cath date. Pt aware that we will call him once reviewed by physician.

## 2015-04-20 NOTE — Telephone Encounter (Signed)
-----   Message from Jerline Pain, MD sent at 04/20/2015  2:06 PM EDT ----- [place] for cath.

## 2015-04-20 NOTE — Telephone Encounter (Signed)
Follow up     Talk to the nurse regarding if he cancelled his cath, when can he reschedule it?

## 2015-04-20 NOTE — Telephone Encounter (Signed)
Lmom.labs are ok

## 2015-04-21 NOTE — Telephone Encounter (Signed)
Pt aware pre cath labs are ok. Pt verbalized understanding.

## 2015-04-21 NOTE — Telephone Encounter (Signed)
F/u  Pt returning [facility] phone number. Pt wanted to speak w/ RN concerning the actual numbers of his results. Please call back and discuss- per pt- can leave detailed message on VM concerning his HDL, LDL, and triglycerides

## 2015-04-22 ENCOUNTER — Encounter (HOSPITAL_COMMUNITY)
Admission: RE | Disposition: A | Discharge status: Home / Self Care | Payer: Self-pay | Source: Ambulatory Visit | Attending: Cardiology

## 2015-04-22 ENCOUNTER — Telehealth: Payer: Self-pay | Admitting: Cardiology

## 2015-04-22 ENCOUNTER — Ambulatory Visit (HOSPITAL_COMMUNITY)
Admission: RE | Admit: 2015-04-22 | Discharge: 2015-04-22 | Disposition: A | Discharge status: Home / Self Care | Payer: Medicare Other | Source: Ambulatory Visit | Attending: Cardiology | Admitting: Cardiology

## 2015-04-22 DIAGNOSIS — F1021 Alcohol dependence, in remission: Secondary | ICD-10-CM | POA: Insufficient documentation

## 2015-04-22 DIAGNOSIS — Z87442 Personal history of urinary calculi: Secondary | ICD-10-CM | POA: Diagnosis not present

## 2015-04-22 DIAGNOSIS — F329 Major depressive disorder, single episode, unspecified: Secondary | ICD-10-CM | POA: Insufficient documentation

## 2015-04-22 DIAGNOSIS — R9439 Abnormal result of other cardiovascular function study: Secondary | ICD-10-CM | POA: Insufficient documentation

## 2015-04-22 DIAGNOSIS — E785 Hyperlipidemia, unspecified: Secondary | ICD-10-CM | POA: Diagnosis not present

## 2015-04-22 DIAGNOSIS — Z7982 Long term (current) use of aspirin: Secondary | ICD-10-CM | POA: Insufficient documentation

## 2015-04-22 DIAGNOSIS — K219 Gastro-esophageal reflux disease without esophagitis: Secondary | ICD-10-CM | POA: Insufficient documentation

## 2015-04-22 DIAGNOSIS — E786 Lipoprotein deficiency: Secondary | ICD-10-CM | POA: Diagnosis not present

## 2015-04-22 DIAGNOSIS — E222 Syndrome of inappropriate secretion of antidiuretic hormone: Secondary | ICD-10-CM | POA: Diagnosis not present

## 2015-04-22 DIAGNOSIS — I10 Essential (primary) hypertension: Secondary | ICD-10-CM | POA: Insufficient documentation

## 2015-04-22 DIAGNOSIS — F419 Anxiety disorder, unspecified: Secondary | ICD-10-CM | POA: Insufficient documentation

## 2015-04-22 DIAGNOSIS — R002 Palpitations: Secondary | ICD-10-CM | POA: Diagnosis not present

## 2015-04-22 DIAGNOSIS — I440 Atrioventricular block, first degree: Secondary | ICD-10-CM | POA: Diagnosis not present

## 2015-04-22 DIAGNOSIS — I25119 Atherosclerotic heart disease of native coronary artery with unspecified angina pectoris: Secondary | ICD-10-CM | POA: Insufficient documentation

## 2015-04-22 DIAGNOSIS — Z955 Presence of coronary angioplasty implant and graft: Secondary | ICD-10-CM | POA: Insufficient documentation

## 2015-04-22 DIAGNOSIS — M109 Gout, unspecified: Secondary | ICD-10-CM | POA: Diagnosis not present

## 2015-04-22 DIAGNOSIS — I2583 Coronary atherosclerosis due to lipid rich plaque: Secondary | ICD-10-CM

## 2015-04-22 DIAGNOSIS — Z880 Allergy status to penicillin: Secondary | ICD-10-CM | POA: Insufficient documentation

## 2015-04-22 DIAGNOSIS — Z87891 Personal history of nicotine dependence: Secondary | ICD-10-CM | POA: Diagnosis not present

## 2015-04-22 DIAGNOSIS — I2510 Atherosclerotic heart disease of native coronary artery without angina pectoris: Secondary | ICD-10-CM

## 2015-04-22 DIAGNOSIS — E538 Deficiency of other specified B group vitamins: Secondary | ICD-10-CM | POA: Insufficient documentation

## 2015-04-22 HISTORY — PX: CARDIAC CATHETERIZATION: SHX172

## 2015-04-22 SURGERY — LEFT HEART CATH AND CORONARY ANGIOGRAPHY
Anesthesia: LOCAL

## 2015-04-22 MED ORDER — LIDOCAINE HCL (PF) 1 % IJ SOLN
INTRAMUSCULAR | Status: DC | PRN
  Administered 2015-04-22: 5 mL

## 2015-04-22 MED ORDER — SODIUM CHLORIDE 0.9 % WEIGHT BASED INFUSION: 3.0000 mL/kg/h | INTRAVENOUS | Status: DC

## 2015-04-22 MED ORDER — SODIUM CHLORIDE 0.9 % IV SOLN: 250.0000 mL | INTRAVENOUS | Status: DC | PRN

## 2015-04-22 MED ORDER — LIDOCAINE HCL (PF) 1 % IJ SOLN
INTRAMUSCULAR | Status: AC
  Filled 2015-04-22: qty 30

## 2015-04-22 MED ORDER — SODIUM CHLORIDE 0.9 % WEIGHT BASED INFUSION: 1.0000 mL/kg/h | INTRAVENOUS | Status: DC

## 2015-04-22 MED ORDER — FENTANYL CITRATE (PF) 100 MCG/2ML IJ SOLN
INTRAMUSCULAR | Status: AC
  Filled 2015-04-22: qty 4

## 2015-04-22 MED ORDER — HEPARIN SODIUM (PORCINE) 1000 UNIT/ML IJ SOLN
INTRAMUSCULAR | Status: DC | PRN
  Administered 2015-04-22: 4500 [IU] via INTRAVENOUS

## 2015-04-22 MED ORDER — MIDAZOLAM HCL 2 MG/2ML IJ SOLN
INTRAMUSCULAR | Status: AC
  Filled 2015-04-22: qty 4

## 2015-04-22 MED ORDER — SODIUM CHLORIDE 0.9 % IJ SOLN: 3.0000 mL | INTRAMUSCULAR | Status: DC | PRN

## 2015-04-22 MED ORDER — HEPARIN SODIUM (PORCINE) 1000 UNIT/ML IJ SOLN
INTRAMUSCULAR | Status: AC
  Filled 2015-04-22: qty 1

## 2015-04-22 MED ORDER — SODIUM CHLORIDE 0.9 % IJ SOLN: 3.0000 mL | Freq: Two times a day (BID) | INTRAMUSCULAR | Status: DC

## 2015-04-22 MED ORDER — VERAPAMIL HCL 2.5 MG/ML IV SOLN
INTRAVENOUS | Status: AC
  Filled 2015-04-22: qty 2

## 2015-04-22 MED ORDER — IOHEXOL 350 MG/ML SOLN
INTRAVENOUS | Status: DC | PRN
  Administered 2015-04-22: 75 mL via INTRA_ARTERIAL

## 2015-04-22 MED ORDER — HEPARIN (PORCINE) IN NACL 2-0.9 UNIT/ML-% IJ SOLN
INTRAMUSCULAR | Status: AC
  Filled 2015-04-22: qty 1500

## 2015-04-22 MED ORDER — SODIUM CHLORIDE 0.9 % WEIGHT BASED INFUSION
3.0000 mL/kg/h | INTRAVENOUS | Status: DC
  Administered 2015-04-22: 3 mL/kg/h via INTRAVENOUS

## 2015-04-22 MED ORDER — ASPIRIN 81 MG PO CHEW: 81.0000 mg | CHEWABLE_TABLET | ORAL | Status: DC

## 2015-04-22 MED ORDER — VERAPAMIL HCL 2.5 MG/ML IV SOLN
INTRAVENOUS | Status: DC | PRN
  Administered 2015-04-22: 8 mL via INTRA_ARTERIAL

## 2015-04-22 SURGICAL SUPPLY — 13 items
CATH INFINITI 5 FR JL3.5 (CATHETERS) ×2 IMPLANT
CATH INFINITI 5FR ANG PIGTAIL (CATHETERS) ×2 IMPLANT
CATH INFINITI 5FR JL4 (CATHETERS) ×2 IMPLANT
CATH INFINITI JR4 5F (CATHETERS) ×2 IMPLANT
DEVICE RAD COMP TR BAND LRG (VASCULAR PRODUCTS) ×2 IMPLANT
GLIDESHEATH SLEND SS 6F .021 (SHEATH) ×2 IMPLANT
KIT HEART LEFT (KITS) ×2 IMPLANT
PACK CARDIAC CATHETERIZATION (CUSTOM PROCEDURE TRAY) ×2 IMPLANT
SYR MEDRAD MARK V 150ML (SYRINGE) ×2 IMPLANT
TRANSDUCER W/STOPCOCK (MISCELLANEOUS) ×2 IMPLANT
TUBING CIL FLEX 10 FLL-RA (TUBING) ×2 IMPLANT
WIRE MICROINTRODUCER 60CM (WIRE) ×2 IMPLANT
WIRE SAFE-T 1.5MM-J .035X260CM (WIRE) ×2 IMPLANT

## 2015-04-22 NOTE — Telephone Encounter (Signed)
New Message    Please call pt about his cath that he had done today and his heart surgery

## 2015-04-22 NOTE — Interval H&P Note (Signed)
History and Physical Interval Note:  04/22/2015 10:35 AM  Dennis York  has presented today for surgery, with the diagnosis of abnormal nuclear stress test, angina  The various methods of treatment have been discussed with the patient and family. After consideration of risks, benefits and other options for treatment, the patient has consented to  Procedure(s): Left Heart Cath and Coronary Angiography (N/A) as a surgical intervention .  The patient's history has been reviewed, patient examined, no change in status, stable for surgery.  I have reviewed the patient's chart and labs.  Questions were answered to the patient's satisfaction.     SKAINS, MARK

## 2015-04-22 NOTE — H&P (View-Only) (Signed)
[facility]. [address], [address] [place], [place]  [address] Phone: [phone] Fax:  [phone]  Date:  04/19/2015   ID:  Dennis York, DOB Nov 29, 1940, MRN [id number]  PCP:  Mathews Argyle, MD   History of Present Illness: Dennis York is a 74 y.o. male with coronary artery disease status post stent to LAD in 1994 with hypertension, hyperlipidemia, gout, former smoker here for followup. Former patient of Dr. Doreatha Lew.   Nuclear stress test 2013 was low risk but no ischemia.  In the past, has had issues with alcohol, rehabilitation efforts. He's been compliant with his medications. Occasionally feels tachycardic/palpitations/anxiety.  In regards to coronary artery disease, angioplasty nearly 20 years ago and having no exertional symptoms until recently.  He is had issues in the past with some cholesterol medications. Pravastatin, low-dose has been taken in the past, LDL in October of 2013 was 165. I made the switch for him to take atorvastatin at that time. LDL 96 from 147.  Surf fishing enjoys. [employer] 3 days a week. Vigorous exercise. Previously [place]im[place] the hills in [place] without any problems.  At a prior visit, he did ask me why he did not do a stress test every year. Last stress test in 2013 was low risk. I explained to him the risks and benefits of serial testing.   04/19/15 - GYM on bike for 25 min. Hard work out. Noted some tightness across chest. Went back to gym next day. Angina returned. Had evaluation done with cardiologist in [place]. Had stress test done. ST segments are depressed 72mm. Had NUC portion. Mid to apical defect in the LAD territory noticed. Cardiac catheterization was recommended. He has stopped performing any heavy exertional activity.   Wt Readings from Last 3 Encounters:  04/19/15 193 lb 9.6 oz (87.816 kg)  10/26/14 196 lb 6.4 oz (89.086 kg)  04/27/14 205 lb (92.987 kg)     Past Medical History  Diagnosis Date  . Alcohol abuse, in  remission   . Hyperlipidemia   . HTN (hypertension)   . Anxiety   . Normal nuclear stress test     04/2012 [facility]  . Depression   . Complication of anesthesia     allergic reactions to Barbituates  . CAD (coronary artery disease)     Prior PCI to LAD in 1994 sees Dr Etter Sjogren  . Kidney stones     hx of   . GERD (gastroesophageal reflux disease)   . H/O hiatal hernia   . Swelling of both ankles     "if I'm on my feet alot or [place] to [place] to see daughter" (05/01/2012)  . Lower GI bleeding     "had colonoscopy; it was internal  hemorrhoids"   . Arthritis     "hands, wrists, ankle on right" (05/01/2012)  . Hypocholesterolemia   . Left knee DJD   . Hiatal hernia   . H/O macrocytic anemia 10/2011    , Mild, Hemoglobin 12.5  . Vitamin B 12 deficiency 10/2011    , Start oral replacement  . Iron deficiency   . SIADH (syndrome of inappropriate ADH production) [facility])     Past Surgical History  Procedure Laterality Date  . Replacement total knee  08/2008; 04/30/2012    right; left  . US echocardiography  09/04/2005    EF 55-60%  . Joint replacement    . Cardiovascular stress test    . Eye surgery  bilateral cataracts removed 2013  . Tonsillectomy  1946  . Minor hemorrhoidectomy  ~ 1960  . Knee arthroscopy  ~ 2001    left  . Cataract extraction w/ intraocular lens  implant, bilateral  ~ 2010; 03/2012    right; left  . Cardiac catheterization  01/30/1993    EF 55-60%  . Coronary angioplasty  1990's  . Total knee arthroplasty  04/30/2012    Procedure: TOTAL KNEE ARTHROPLASTY;  Surgeon: Ninetta Lights, MD;  Location: [facility];  Service: Orthopedics;  Laterality: Left;  left total knee arthroplasty    Current Outpatient Prescriptions  Medication Sig Dispense Refill  . amLODipine (NORVASC) 5 MG tablet Take 1 tablet (5 mg total) by mouth daily. for blood pressure 30 tablet 3  . aspirin 81 MG tablet Take 81 mg by mouth daily.    Marland Kitchen atorvastatin (LIPITOR) 40 MG tablet  Take 1 tablet (40 mg total) by mouth daily. 30 tablet 6  . metoprolol tartrate (LOPRESSOR) 25 MG tablet Take 0.5 tablets (12.5 mg total) by mouth 2 (two) times daily. For control of high blood pressure 30 tablet 0  . nitroGLYCERIN (NITROSTAT) 0.4 MG SL tablet      No current facility-administered medications for this visit.    Allergies:    Allergies  Allergen Reactions  . Ativan [Lorazepam] Other (See Comments)    ATAXIA LASTING FOR SEVERAL DAYS  . Barbiturates Swelling and Other (See Comments)    "skin on head of penis came off; raw meat"  . Bee Venom Anaphylaxis    Patient carries an epi pen  . Penicillins Anaphylaxis  . Zetia [Ezetimibe]   . Statins Anxiety    Social History:  The patient  reports that he quit smoking about 20 years ago. His smoking use included Cigarettes. He has a 60 pack-year smoking history. He has never used smokeless tobacco. He reports that he does not drink alcohol or use illicit drugs.   ROS:  Please see the history of present illness.  Denies any bleeding, syncope, orthopnea, PND.  All other systems reviewed and negative.   PHYSICAL EXAM: VS:  BP 138/74 mmHg  Pulse 61  Ht 5\' 6"  (1.676 m)  Wt 193 lb 9.6 oz (87.816 kg)  BMI 31.26 kg/m2 Well nourished, well developed, in no acute distress HEENT: normal Neck: no JVD Cardiac:  normal S1, S2; RRR; no murmur Lungs:  clear to auscultation bilaterally, no wheezing, rhonchi or rales Abd: soft, nontender, no hepatomegaly Ext: no edema Skin: warm and dry2+ DP right ankle brace. Neuro: no focal abnormalities noted, mildly anxious     EKG: Today 04/19/15-sinus rhythm, first-degree AV block, 228 ms. 04/07/13-normal sinus rhythm, 89, nonspecific ST-T wave changes Labs: 10/06/13 -Total cholesterol 154, LDL 96, HDL 39  ASSESSMENT AND PLAN:  1. Abnormal nuclear stress test moderate-sized mid to distal anterior apical reversible perfusion defect suggestive of ischemia in the LAD territory. 3 times a day was 0.89.  Normal. LVEF was 64%. He had 2 mm of ST segment depression on his treadmill test. Inferior leads. Heart catheterization was recommended at the place of nuclear stress test, [facility] Moos, [place] [facility]. Based upon his exertional anginal symptoms, we will go ahead and proceed with cardiac catheterization. Risks and benefits discussed including stroke, heart attack, death, renal impairment. Radial approach. 2. Coronary artery disease status post LAD stent- exertional anginal symptoms noted. Proceeding with heart catheterization. Continuing with secondary prevention efforts. Promoting diet, exercise. He is quite active, walks the hills of  [place].  Had lengthy discussion about future risks of heart attack. He understands that this may happen because of his prior CAD history. 3. Palpitations - at previous visit, PVC at night. He states that after stopping his Prilosec which he read on the Internet can be a cause of palpitations but this seemed to help. 4. Hyperlipidemia-has had some issues with statins in the past. His medication list shows pravastatin 10 mg. We have tried atorvastatin in the past. He recently has run out. I have restarted atorvastatin 20 mg. Past lipid/LDL 147. Lipid profile with atorvastatin is 96. Goal 70.  5. Hypertension-currently stable, no changes made to medications. Diet, exercise. Medications reviewed. 6. First-degree AV block-mild. On low-dose beta blocker. If PVCs worsen, I would consider increasing low-dose beta blocker and monitoring his first degree AV block. 7. We will see him back post catheterization  Cath Lab Visit (complete for each Cath Lab visit)  Clinical Evaluation Leading to the Procedure:   ACS: No.  Non-ACS:    Anginal Classification: CCS III  Anti-ischemic medical therapy: Minimal Therapy (1 class of medications)  Non-Invasive Test Results: High-risk stress test findings: cardiac mortality >3%/year  Prior CABG: No previous  CABG     Signed, Candee Furbish, MD [facility]  04/19/2015 2:38 PM

## 2015-04-22 NOTE — Discharge Instructions (Signed)
Radial Site Care °Refer to this sheet in the next few weeks. These instructions provide you with information about caring for yourself after your procedure. Your health care provider may also give you more specific instructions. Your treatment has been planned according to current medical practices, but problems sometimes occur. Call your health care provider if you have any problems or questions after your procedure. °WHAT TO EXPECT AFTER THE PROCEDURE °After your procedure, it is typical to have the following: °· Bruising at the radial site that usually fades within 1-2 weeks. °· Blood collecting in the tissue (hematoma) that may be painful to the touch. It should usually decrease in size and tenderness within 1-2 weeks. °HOME CARE INSTRUCTIONS °· Take medicines only as directed by your health care provider. °· You may shower 24-48 hours after the procedure or as directed by your health care provider. Remove the bandage (dressing) and gently wash the site with plain soap and water. Pat the area dry with a clean towel. Do not rub the site, because this may cause bleeding. °· Do not take baths, swim, or use a hot tub until your health care provider approves. °· Check your insertion site every day for redness, swelling, or drainage. °· Do not apply powder or lotion to the site. °· Do not flex or bend the affected arm for 24 hours or as directed by your health care provider. °· Do not push or pull heavy objects with the affected arm for 24 hours or as directed by your health care provider. °· Do not lift over 10 lb (4.5 kg) for 5 days after your procedure or as directed by your health care provider. °· Ask your health care provider when it is okay to: °¨ Return to work or school. °¨ Resume usual physical activities or sports. °¨ Resume sexual activity. °· Do not drive home if you are discharged the same day as the procedure. Have someone else drive you. °· You may drive 24 hours after the procedure unless otherwise  instructed by your health care provider. °· Do not operate machinery or power tools for 24 hours after the procedure. °· If your procedure was done as an outpatient procedure, which means that you went home the same day as your procedure, a responsible adult should be with you for the first 24 hours after you arrive home. °· Keep all follow-up visits as directed by your health care provider. This is important. °SEEK MEDICAL CARE IF: °· You have a fever. °· You have chills. °· You have increased bleeding from the radial site. Hold pressure on the site. °SEEK IMMEDIATE MEDICAL CARE IF: °· You have unusual pain at the radial site. °· You have redness, warmth, or swelling at the radial site. °· You have drainage (other than a small amount of blood on the dressing) from the radial site. °· The radial site is bleeding, and the bleeding does not stop after 30 minutes of holding steady pressure on the site. °· Your arm or hand becomes pale, cool, tingly, or numb. °  °This information is not intended to replace advice given to you by your health care provider. Make sure you discuss any questions you have with your health care provider. °  °Document Released: 07/21/2010 Document Revised: 07/09/2014 Document Reviewed: 01/04/2014 °Elsevier Interactive Patient Education ©2016 [employer] ° °

## 2015-04-22 NOTE — Telephone Encounter (Signed)
Returned patients call.   Patient sts that he had a cath this am with Dr Marlou Porch.  Says he would like to further discuss his options with the Dr. He wants to discuss choices, some of his ideas and forward steps.

## 2015-04-23 NOTE — Telephone Encounter (Signed)
First step is to discuss with cardiothoracic surgery. It is imperative that he go to this appointment. Has left main and proximal LAD disease. Many of His questions will be able to be answered at that time by Dr. Cyndia Bent on Monday. Thanks  Candee Furbish, MD

## 2015-04-25 ENCOUNTER — Encounter (HOSPITAL_COMMUNITY): Payer: Self-pay | Admitting: Cardiology

## 2015-04-25 ENCOUNTER — Institutional Professional Consult (permissible substitution) (INDEPENDENT_AMBULATORY_CARE_PROVIDER_SITE_OTHER): Payer: Medicare Other | Admitting: Surgery

## 2015-04-25 VITALS — BP 140/77 | HR 64 | Resp 20 | Ht 69.0 in | Wt 192.0 lb

## 2015-04-25 DIAGNOSIS — I25119 Atherosclerotic heart disease of native coronary artery with unspecified angina pectoris: Secondary | ICD-10-CM | POA: Diagnosis not present

## 2015-04-25 NOTE — Telephone Encounter (Signed)
Follow Up  Pt states he is need of the notes from his Cath. He states that he will need it for another appt.

## 2015-04-25 NOTE — Progress Notes (Signed)
PCP is Mathews Argyle, MD Referring Provider is Jerline Pain, MD  Chief Complaint  Patient presents with  . Coronary Artery Disease    surgical eval, cardiac cath 04/22/15    HPI:  The patient is a 74 year old active gentleman with a history of hyperlipidemia, hypertension and coronary disease s/p PCI of the LAD in 1994. He was followed be Dr. Doreatha Lew until his retirement and did well until recently. He has gone to the [facility] 3 days per week and done vigorous workouts until a few weeks ago when he noted some tightness across his chest after riding an exercise bike for 25 min. He went back to the gym the next day and had the same symptom so he went to his a cardiologist in [place] and had a stress tiest that was positive with a defect in the mid to distal LAD territory and ST depression. Cardiac cath on 04/22/2015 showed a 60% highly calcified LM  Stenosis and a 95% ostial and proximal LAD stenosis that was very calcified. The LCX had no significant stenosis itself and the RCA had mild non-obstructive disease. LVEF was 64%. He has only had symptoms with heavy exertion and has subsequently avoided that.  Past Medical History  Diagnosis Date  . Alcohol abuse, in remission   . Hyperlipidemia   . HTN (hypertension)   . Anxiety   . Normal nuclear stress test     04/2012 [facility]  . Depression   . Complication of anesthesia     allergic reactions to Barbituates  . CAD (coronary artery disease)     Prior PCI to LAD in 1994 sees Dr Etter Sjogren  . Kidney stones     hx of   . GERD (gastroesophageal reflux disease)   . H/O hiatal hernia   . Swelling of both ankles     "if I'm on my feet alot or [place] to [place] to see [place]" (05/01/2012)  . Lower GI bleeding     "had colonoscopy; it was internal  hemorrhoids"   . Arthritis     "hands, wrists, ankle on right" (05/01/2012)  . Hypocholesterolemia   . Left knee DJD   . Hiatal hernia   . H/O macrocytic anemia 10/2011      , Mild, Hemoglobin 12.5  . Vitamin B 12 deficiency 10/2011    , Start oral replacement  . Iron deficiency   . SIADH (syndrome of inappropriate ADH production) [facility])     Past Surgical History  Procedure Laterality Date  . Replacement total knee  08/2008; 04/30/2012    right; left  . US echocardiography  09/04/2005    EF 55-60%  . Joint replacement    . Cardiovascular stress test    . Eye surgery      bilateral cataracts removed 2013  . Tonsillectomy  1946  . Minor hemorrhoidectomy  ~ 1960  . Knee arthroscopy  ~ 2001    left  . Cataract extraction w/ intraocular lens  implant, bilateral  ~ 2010; 03/2012    right; left  . Cardiac catheterization  01/30/1993    EF 55-60%  . Coronary angioplasty  1990's  . Total knee arthroplasty  04/30/2012    Procedure: TOTAL KNEE ARTHROPLASTY;  Surgeon: Ninetta Lights, MD;  Location: [place] [place];  Service: Orthopedics;  Laterality: Left;  left total knee arthroplasty  . Cardiac catheterization N/A 04/22/2015    Procedure: Left Heart Cath and Coronary Angiography;  Surgeon: Thana Farr  Marlou Porch, MD;  Location: [place] [facility];  Service: Cardiovascular;  Laterality: N/A;    Family History  Problem Relation Age of Onset  . Hypertension    . Depression    . Breast cancer Mother   . Heart disease Father     Social History Social History  Substance Use Topics  . Smoking status: Former Smoker -- 2.00 packs/day for 30 years    Types: Cigarettes    Quit date: 02/28/1995  . Smokeless tobacco: Never Used  . Alcohol Use: No     Comment: 05/01/2012 "totally quit alcohol in ~ 10 months; I was drinking too much beer so I quit"    Current Outpatient Prescriptions  Medication Sig Dispense Refill  . amLODipine (NORVASC) 5 MG tablet Take 1 tablet (5 mg total) by mouth daily. for blood pressure 30 tablet 3  . aspirin 81 MG tablet Take 81 mg by mouth daily.    Marland Kitchen atorvastatin (LIPITOR) 40 MG tablet Take 1 tablet (40 mg total) by mouth daily. 30 tablet 6  .  diphenhydrAMINE (BENADRYL) 25 mg capsule Take 25 mg by mouth at bedtime as needed.    . metoprolol tartrate (LOPRESSOR) 25 MG tablet Take 0.5 tablets (12.5 mg total) by mouth 2 (two) times daily. For control of high blood pressure (Patient taking differently: Take 12.5 mg by mouth daily. For control of high blood pressure) 30 tablet 0  . nitroGLYCERIN (NITROSTAT) 0.4 MG SL tablet      No current facility-administered medications for this visit.    Allergies  Allergen Reactions  . Ativan [Lorazepam] Other (See Comments)    ATAXIA LASTING FOR SEVERAL DAYS  . Barbiturates Swelling and Other (See Comments)    "skin on head of penis came off; raw meat"  . Bee Venom Anaphylaxis    Patient carries an epi pen  . Penicillins Anaphylaxis  . Morphine And Related Itching  . Zetia [Ezetimibe]   . Statins Anxiety    Review of Systems  Constitutional: Positive for activity change. Negative for fatigue.  HENT: Negative.   Respiratory: Negative.   Cardiovascular: Positive for chest pain. Negative for palpitations and leg swelling.  Gastrointestinal: Negative.   Endocrine: Negative.   Genitourinary: Negative.   Musculoskeletal: Negative.   Skin: Negative.   Allergic/Immunologic: Negative.   Neurological: Negative.   Hematological: Negative.   Psychiatric/Behavioral: Negative.     BP 140/77 mmHg  Pulse 64  Resp 20  Ht 5\' 9"  (1.753 m)  Wt 192 lb (87.091 kg)  BMI 28.34 kg/m2  SpO2 96% Physical Exam  Constitutional: He is oriented to person, place, and time. He appears well-developed and well-nourished. He appears distressed.  HENT:  Head: Normocephalic and atraumatic.  Mouth/Throat: Oropharynx is clear and moist.  Eyes: EOM are normal. Pupils are equal, round, and reactive to light.  Neck: Normal range of motion. Neck supple. No tracheal deviation present. No thyromegaly present.  Cardiovascular: Normal rate, regular rhythm, normal heart sounds and intact distal pulses.   No murmur  heard. Pulmonary/Chest: Effort normal and breath sounds normal. No respiratory distress.  Abdominal: Soft. Bowel sounds are normal. He exhibits no distension and no mass. There is no tenderness.  Musculoskeletal: Normal range of motion. He exhibits no edema or tenderness.  Lymphadenopathy:    He has no cervical adenopathy.  Neurological: He is alert and oriented to person, place, and time. He has normal reflexes.  Skin: Skin is warm and dry.  Psychiatric: He has a normal mood  and affect.     Diagnostic Tests:  Procedures    Left Heart Cath and Coronary Angiography    Conclusion     Severe coronary artery disease-60% highly calcified left main, 95% ostial/proximal LAD. RCA with mild non-hemodynamic luminal irregularities.  Low normal ejection fraction of 50-55%. No obvious wall motion abnormalities (nuclear stress test EF 64%)  Referral to cardiothoracic surgery for bypass. Lesions high risk for PCI. He is not having any unstable symptoms, no chest pain currently. He will see cardiothoracic surgery as an outpatient in the next few days. If he does have any chest discomfort, he knows to proceed to the emergency department. Continue with aggressive medical management.     Technique and Indications    Radial artery access was obtained. After 2 unsuccessful attempts with the modified Seldinger technique, the third attempt more proximally was successful. 3 mg of verapamil and 4500 units of heparin were utilized. A Judkins right #4 catheter was utilized to selectively cannulate the right coronary artery. Injections were made. The Judkins left 3.5 catheter did not adequately reach the left main artery therefore a Judkins left 4 catheter was then used. There was a significant amount of calcium around the left main and the catheter was able to get close to the ostium but not subselect and adequate views were obtained with hand injection. There was no dampening of hemodynamics. Left  ventriculogram was used with Judkins right catheter. Following the procedure, he had a small hematoma at his wrist site. Hemostasis was obtained. Terumo T band was used.Estimated blood loss <50 mL.    Coronary Findings    Dominance: Right   Left Main  Severe calcification of left main after seeing into the proximal aorta is well. The there is 60% ostial left main disease with a shelf of calcium surrounding. The left main branches into the LAD and circumflex artery. Catheter was nonselective.     Left Anterior Descending  There is severe calcification of the proximal to mid LAD with ostial/proximal calcification and stenosis of up to 95% the LAD then continues to wrap around the apex and appears to have a good target for left internal mammary artery. There is also a previously placed stent in the what appears to be diagonal branch, difficult to clarify.     Left Circumflex  Circumflex vessels overall are widely patent, obtuse marginal branch present.     Right Coronary Artery  20-30% proximal lesion as well as 30% mid RCA lesion, PDA vessel patent. Dominant vessel.      Wall Motion    Low normal 50-55%. Nuclear ejection fraction was 64%. No obvious wall motion abnormalities.          Left Heart    Left Ventricle The left ventricular size is normal. The left ventricular systolic function is normal. There are no wall motion abnormalities in the left ventricle.   Mitral Valve There is no mitral valve regurgitation.   Aortic Valve There is no aortic valve stenosis.    Implants    Name ID Temporary Type Supply   No information to display    PACS Images    Show images for Cardiac catheterization     Link to Procedure Log    Procedure Log      Hemo Data       Most Recent Value   AO Systolic Pressure  629 mmHg   AO Diastolic Pressure  56 mmHg   AO Mean  81 mmHg   LV Systolic  Pressure  111 mmHg   LV Diastolic Pressure  2 mmHg   LV EDP  7 mmHg    Arterial Occlusion Pressure Extended Systolic Pressure  154 mmHg   Arterial Occlusion Pressure Extended Diastolic Pressure  53 mmHg   Arterial Occlusion Pressure Extended Mean Pressure  78 mmHg   Left Ventricular Apex Extended Systolic Pressure  008 mmHg   Left Ventricular Apex Extended Diastolic Pressure  2 mmHg   Left Ventricular Apex Extended EDP Pressure  9 mmHg    Order-Level Documents:    There are no order-level documents.    Encounter-Level Documents - 04/19/15:      Scan on 04/22/2015 11:33 AM by Provider Default, MDScan on 04/22/2015 11:33 AM by Provider Default, MD    Signed    Electronically signed by Jerline Pain, MD on 04/22/15 at 1140 EDT    Impression:  I have personally reviewed his cardiac cath and stress test. He has severe calcification of the left main and proximal LAD with 60% ostial LM stenosis and a long ostial and proximal 95% LAD stenosis. He has had angina with heavy exertion and is a physically active gentleman so he has had to stop this which is really limiting his lifestyle. I agree that CABG is indicated to prevent ischemia, infarction and death as well as to improve his quality of life. I discussed the operative procedure with the patient and his wife including alternatives, benefits and risks; including but not limited to bleeding, blood transfusion, infection, stroke, myocardial infarction, graft failure, heart block requiring a permanent pacemaker, organ dysfunction, and death.  Dennis York understands and agrees to proceed.    Plan:  He will call us later today or tomorrow to schedule surgery. I told him that I thought it needed to be done as quickly as possible to decrease the risk of further ischemia.  [name], MD [facility] [phone]

## 2015-04-25 NOTE — Telephone Encounter (Signed)
Left message for pt to c/b.  Assured him that Dr Cyndia Bent can see all notes, labs, procedures etc. That have been completed as he is on our same computer system.  Requested he call back if he is in need of something else.

## 2015-04-26 ENCOUNTER — Other Ambulatory Visit: Payer: Self-pay | Admitting: *Deleted

## 2015-04-26 DIAGNOSIS — I2510 Atherosclerotic heart disease of native coronary artery without angina pectoris: Secondary | ICD-10-CM

## 2015-04-26 NOTE — Telephone Encounter (Signed)
Left message for pt to call back if further concerns.

## 2015-04-27 NOTE — Pre-Procedure Instructions (Signed)
    Mivaan Corbitt  04/27/2015      [facility] [place] [place] [address] Phone: [phone] Fax: [phone]  [facility] [address] - [place], [place] - [address] [place] [address] Phone: [phone] Fax: [phone]  [place] [place] [address] Phone: [phone] Fax: [phone]    Your procedure is scheduled on 04/29/15.  Report to [facility] Admitting at 530 A.M.  Call this number if you have problems the morning of surgery:  [phone]   Remember:  Do not eat food or drink liquids after midnight.  Take these medicines the morning of surgery with A SIP OF WATER --norvasc,,metoprolol   Do not wear jewelry, make-up or nail polish.  Do not wear lotions, powders, or perfumes.  You may wear deodorant.  Do not shave 48 hours prior to surgery.  Men may shave face and neck.  Do not bring valuables to the hospital.  [facility] is not responsible for any belongings or valuables.  Contacts, dentures or bridgework may not be worn into surgery.  Leave your suitcase in the car.  After surgery it may be brought to your room.  For patients admitted to the hospital, discharge time will be determined by your treatment team.  Patients discharged the day of surgery will not be allowed to drive home.   Name and phone number of your driver:    Special instructions:   Please read over the following fact sheets that you were given. Pain Booklet, Coughing and Deep Breathing, Blood Transfusion Information, MRSA Information and Surgical Site Infection Prevention

## 2015-04-28 ENCOUNTER — Encounter (HOSPITAL_COMMUNITY)
Admission: RE | Admit: 2015-04-28 | Discharge: 2015-04-28 | Disposition: A | Discharge status: Home / Self Care | Payer: Medicare Other | Source: Ambulatory Visit

## 2015-04-28 ENCOUNTER — Encounter (HOSPITAL_COMMUNITY): Payer: Self-pay

## 2015-04-28 ENCOUNTER — Ambulatory Visit (HOSPITAL_COMMUNITY)
Admission: RE | Admit: 2015-04-28 | Discharge: 2015-04-28 | Disposition: A | Discharge status: Home / Self Care | Payer: Medicare Other | Source: Ambulatory Visit | Attending: Surgery | Admitting: Surgery

## 2015-04-28 ENCOUNTER — Encounter (HOSPITAL_COMMUNITY)
Admission: RE | Admit: 2015-04-28 | Discharge: 2015-04-28 | Disposition: A | Discharge status: Home / Self Care | Payer: Medicare Other | Source: Ambulatory Visit | Attending: Surgery | Admitting: Surgery

## 2015-04-28 ENCOUNTER — Ambulatory Visit (HOSPITAL_BASED_OUTPATIENT_CLINIC_OR_DEPARTMENT_OTHER)
Admission: RE | Admit: 2015-04-28 | Discharge: 2015-04-28 | Disposition: A | Discharge status: Home / Self Care | Payer: Medicare Other | Source: Ambulatory Visit | Attending: Surgery | Admitting: Surgery

## 2015-04-28 VITALS — BP 138/69 | HR 61 | Temp 99.4°F | Resp 20 | Ht 69.0 in | Wt 195.8 lb

## 2015-04-28 DIAGNOSIS — E222 Syndrome of inappropriate secretion of antidiuretic hormone: Secondary | ICD-10-CM | POA: Diagnosis not present

## 2015-04-28 DIAGNOSIS — E786 Lipoprotein deficiency: Secondary | ICD-10-CM | POA: Diagnosis not present

## 2015-04-28 DIAGNOSIS — I2510 Atherosclerotic heart disease of native coronary artery without angina pectoris: Secondary | ICD-10-CM

## 2015-04-28 DIAGNOSIS — D62 Acute posthemorrhagic anemia: Secondary | ICD-10-CM | POA: Diagnosis not present

## 2015-04-28 LAB — COMPREHENSIVE METABOLIC PANEL
ALBUMIN: 3.6 g/dL (ref 3.5–5.0)
ALK PHOS: 76 U/L (ref 38–126)
ALT: 23 U/L (ref 17–63)
AST: 28 U/L (ref 15–41)
Anion gap: 11 (ref 5–15)
BILIRUBIN TOTAL: 0.6 mg/dL (ref 0.3–1.2)
BUN: 9 mg/dL (ref 6–20)
CALCIUM: 9.5 mg/dL (ref 8.9–10.3)
CO2: 20 mmol/L — ABNORMAL LOW (ref 22–32)
Chloride: 105 mmol/L (ref 101–111)
Creatinine, Ser: 1 mg/dL (ref 0.61–1.24)
GFR calc Af Amer: >60 mL/min (ref >60)
GLUCOSE: 101 mg/dL — AB (ref 65–99)
POTASSIUM: 4.3 mmol/L (ref 3.5–5.1)
Sodium: 136 mmol/L (ref 135–145)
TOTAL PROTEIN: 7.2 g/dL (ref 6.5–8.1)

## 2015-04-28 LAB — CBC
HEMATOCRIT: 36.6 % — AB (ref 39.0–52.0)
HEMOGLOBIN: 12.6 g/dL — AB (ref 13.0–17.0)
MCH: 32.3 pg (ref 26.0–34.0)
MCHC: 34.4 g/dL (ref 30.0–36.0)
MCV: 93.8 fL (ref 78.0–100.0)
Platelets: 223 10*3/uL (ref 150–400)
RBC: 3.9 MIL/uL — ABNORMAL LOW (ref 4.22–5.81)
RDW: 13.6 % (ref 11.5–15.5)
WBC: 7.5 10*3/uL (ref 4.0–10.5)

## 2015-04-28 LAB — PULMONARY FUNCTION TEST
DL/VA % PRED: 76 %
DL/VA: 3.32 ml/min/mmHg/L
DLCO UNC % PRED: 77 %
DLCO cor % pred: 80 %
DLCO cor: 21.58 ml/min/mmHg
DLCO unc: 20.82 ml/min/mmHg
FEF 25-75 Post: 3.33 L/sec
FEF 25-75 Pre: 2.64 L/sec
FEF2575-%CHANGE-POST: 26 %
FEF2575-%PRED-PRE: 147 %
FEF2575-%Pred-Post: 185 %
FEV1-%CHANGE-POST: 7 %
FEV1-%PRED-PRE: 131 %
FEV1-%Pred-Post: 140 %
FEV1-PRE: 3.31 L
FEV1-Post: 3.54 L
FEV1FVC-%CHANGE-POST: 9 %
FEV1FVC-%Pred-Pre: 103 %
FEV6-%Change-Post: 0 %
FEV6-%PRED-PRE: 133 %
FEV6-%Pred-Post: 131 %
FEV6-POST: 4.31 L
FEV6-PRE: 4.36 L
FEV6FVC-%Change-Post: 1 %
FEV6FVC-%PRED-POST: 107 %
FEV6FVC-%PRED-PRE: 106 %
FVC-%CHANGE-POST: -2 %
FVC-%PRED-POST: 122 %
FVC-%PRED-PRE: 125 %
FVC-POST: 4.33 L
FVC-PRE: 4.42 L
POST FEV6/FVC RATIO: 100 %
PRE FEV1/FVC RATIO: 75 %
PRE FEV6/FVC RATIO: 99 %
Post FEV1/FVC ratio: 82 %
RV % pred: 139 %
RV: 3.3 L
TLC % pred: 129 %
TLC: 8.1 L

## 2015-04-28 LAB — BLOOD GAS, ARTERIAL
Acid-base deficit: 2.8 mmol/L — ABNORMAL HIGH (ref 0.0–2.0)
BICARBONATE: 21.1 meq/L (ref 20.0–24.0)
Drawn by: 42180
FIO2: 0.21
O2 Saturation: 97.1 %
PCO2 ART: 34.6 mmHg — AB (ref 35.0–45.0)
PH ART: 7.403 (ref 7.350–7.450)
Patient temperature: 98.6
TCO2: 22.2 mmol/L (ref 0–100)
pO2, Arterial: 92.1 mmHg (ref 80.0–100.0)

## 2015-04-28 LAB — URINALYSIS, ROUTINE W REFLEX MICROSCOPIC
Bilirubin Urine: NEGATIVE
Glucose, UA: NEGATIVE mg/dL
Hgb urine dipstick: NEGATIVE
Ketones, ur: NEGATIVE mg/dL
LEUKOCYTES UA: NEGATIVE
NITRITE: NEGATIVE
PROTEIN: NEGATIVE mg/dL
SPECIFIC GRAVITY, URINE: 1.008 (ref 1.005–1.030)
UROBILINOGEN UA: 0.2 mg/dL (ref 0.0–1.0)
pH: 6.5 (ref 5.0–8.0)

## 2015-04-28 LAB — TYPE AND SCREEN
ABO/RH(D): O POS
ANTIBODY SCREEN: NEGATIVE

## 2015-04-28 LAB — SURGICAL PCR SCREEN
MRSA, PCR: NEGATIVE
Staphylococcus aureus: POSITIVE — AB

## 2015-04-28 LAB — APTT: aPTT: 31 seconds (ref 24–37)

## 2015-04-28 LAB — PROTIME-INR
INR: 1.11 (ref 0.00–1.49)
PROTHROMBIN TIME: 14.5 s (ref 11.6–15.2)

## 2015-04-28 MED ORDER — DEXTROSE 5 % IV SOLN
30.0000 ug/min | INTRAVENOUS | Status: DC
  Filled 2015-04-28: qty 2

## 2015-04-28 MED ORDER — CHLORHEXIDINE GLUCONATE 4 % EX LIQD: 30.0000 mL | CUTANEOUS | Status: DC

## 2015-04-28 MED ORDER — HEPARIN SODIUM (PORCINE) 1000 UNIT/ML IJ SOLN
INTRAMUSCULAR | Status: DC
  Filled 2015-04-28: qty 30

## 2015-04-28 MED ORDER — SODIUM CHLORIDE 0.9 % IV SOLN
INTRAVENOUS | Status: AC
  Administered 2015-04-29: 69 mL/h via INTRAVENOUS
  Filled 2015-04-28: qty 40

## 2015-04-28 MED ORDER — MAGNESIUM SULFATE 50 % IJ SOLN
40.0000 meq | INTRAMUSCULAR | Status: DC
  Filled 2015-04-28: qty 10

## 2015-04-28 MED ORDER — DEXMEDETOMIDINE HCL IN NACL 400 MCG/100ML IV SOLN
0.1000 ug/kg/h | INTRAVENOUS | Status: AC
  Administered 2015-04-29: .3 ug/kg/h via INTRAVENOUS
  Filled 2015-04-28: qty 100

## 2015-04-28 MED ORDER — VANCOMYCIN HCL 10 G IV SOLR
1500.0000 mg | INTRAVENOUS | Status: AC
  Administered 2015-04-29: 1500 mg via INTRAVENOUS
  Filled 2015-04-28: qty 1500

## 2015-04-28 MED ORDER — ALBUTEROL SULFATE (2.5 MG/3ML) 0.083% IN NEBU
2.5000 mg | INHALATION_SOLUTION | Freq: Once | RESPIRATORY_TRACT | Status: AC
  Administered 2015-04-28: 2.5 mg via RESPIRATORY_TRACT

## 2015-04-28 MED ORDER — METOPROLOL TARTRATE 12.5 MG HALF TABLET: 12.5000 mg | ORAL_TABLET | ORAL | Status: DC

## 2015-04-28 MED ORDER — CHLORHEXIDINE GLUCONATE 0.12 % MT SOLN
15.0000 mL | OROMUCOSAL | Status: DC
  Filled 2015-04-28: qty 15

## 2015-04-28 MED ORDER — LEVOFLOXACIN IN D5W 500 MG/100ML IV SOLN
500.0000 mg | INTRAVENOUS | Status: AC
  Administered 2015-04-29: 500 mg via INTRAVENOUS
  Filled 2015-04-28: qty 100

## 2015-04-28 MED ORDER — PLASMA-LYTE 148 IV SOLN
INTRAVENOUS | Status: AC
  Administered 2015-04-29: 500 mL
  Filled 2015-04-28: qty 2.5

## 2015-04-28 MED ORDER — POTASSIUM CHLORIDE 2 MEQ/ML IV SOLN
80.0000 meq | INTRAVENOUS | Status: DC
Start: 2015-04-29 — End: 2015-04-29
  Filled 2015-04-28: qty 40

## 2015-04-28 MED ORDER — NITROGLYCERIN IN D5W 200-5 MCG/ML-% IV SOLN
2.0000 ug/min | INTRAVENOUS | Status: DC
Start: 2015-04-29 — End: 2015-04-29
  Filled 2015-04-28: qty 250

## 2015-04-28 MED ORDER — DOPAMINE-DEXTROSE 3.2-5 MG/ML-% IV SOLN
0.0000 ug/kg/min | INTRAVENOUS | Status: DC
  Filled 2015-04-28: qty 250

## 2015-04-28 MED ORDER — SODIUM CHLORIDE 0.9 % IV SOLN
INTRAVENOUS | Status: AC
  Administered 2015-04-29: .9 [IU]/h via INTRAVENOUS
  Filled 2015-04-28: qty 2.5

## 2015-04-28 MED ORDER — DEXTROSE 5 % IV SOLN
0.0000 ug/min | INTRAVENOUS | Status: DC
  Filled 2015-04-28: qty 4

## 2015-04-28 NOTE — Progress Notes (Signed)
Pre-op Cardiac Surgery  Carotid Findings:  1-39% right ICA stenosis.  1-39% left ICA plaquing. Marland Kitchen Upper Extremity Right Left  Brachial Pressures 143T 142T  Radial Waveforms T T  Ulnar Waveforms T T  Palmar Arch (Allen's Test) WNL WNL   Findings:  WNL    Lower  Extremity Right Left  Dorsalis Pedis    Anterior Tibial    Posterior Tibial    Ankle/Brachial Indices      Findings:  Palpable

## 2015-04-29 ENCOUNTER — Inpatient Hospital Stay (HOSPITAL_COMMUNITY): Payer: Medicare Other

## 2015-04-29 ENCOUNTER — Inpatient Hospital Stay (HOSPITAL_COMMUNITY): Payer: Medicare Other | Admitting: Certified Registered Nurse Anesthetist

## 2015-04-29 ENCOUNTER — Inpatient Hospital Stay (HOSPITAL_COMMUNITY)
Admission: RE | Admit: 2015-04-29 | Discharge: 2015-05-03 | DRG: 236 | Disposition: A | Discharge status: Home / Self Care | Payer: Medicare Other | Source: Ambulatory Visit | Attending: Surgery | Admitting: Surgery

## 2015-04-29 ENCOUNTER — Encounter (HOSPITAL_COMMUNITY): Payer: Self-pay | Admitting: Cardiology

## 2015-04-29 ENCOUNTER — Encounter (HOSPITAL_COMMUNITY)
Admission: RE | Disposition: A | Discharge status: Home / Self Care | Payer: Medicare Other | Source: Ambulatory Visit | Attending: Surgery

## 2015-04-29 DIAGNOSIS — Z8249 Family history of ischemic heart disease and other diseases of the circulatory system: Secondary | ICD-10-CM

## 2015-04-29 DIAGNOSIS — Z7982 Long term (current) use of aspirin: Secondary | ICD-10-CM | POA: Diagnosis not present

## 2015-04-29 DIAGNOSIS — Z880 Allergy status to penicillin: Secondary | ICD-10-CM

## 2015-04-29 DIAGNOSIS — Z888 Allergy status to other drugs, medicaments and biological substances status: Secondary | ICD-10-CM

## 2015-04-29 DIAGNOSIS — R7303 Prediabetes: Secondary | ICD-10-CM | POA: Diagnosis present

## 2015-04-29 DIAGNOSIS — Z9861 Coronary angioplasty status: Secondary | ICD-10-CM | POA: Diagnosis not present

## 2015-04-29 DIAGNOSIS — M1990 Unspecified osteoarthritis, unspecified site: Secondary | ICD-10-CM | POA: Diagnosis not present

## 2015-04-29 DIAGNOSIS — J9811 Atelectasis: Secondary | ICD-10-CM

## 2015-04-29 DIAGNOSIS — E222 Syndrome of inappropriate secretion of antidiuretic hormone: Secondary | ICD-10-CM | POA: Diagnosis not present

## 2015-04-29 DIAGNOSIS — Z96652 Presence of left artificial knee joint: Secondary | ICD-10-CM | POA: Diagnosis present

## 2015-04-29 DIAGNOSIS — F419 Anxiety disorder, unspecified: Secondary | ICD-10-CM | POA: Diagnosis present

## 2015-04-29 DIAGNOSIS — Z4682 Encounter for fitting and adjustment of non-vascular catheter: Secondary | ICD-10-CM | POA: Diagnosis not present

## 2015-04-29 DIAGNOSIS — I2510 Atherosclerotic heart disease of native coronary artery without angina pectoris: Principal | ICD-10-CM | POA: Diagnosis present

## 2015-04-29 DIAGNOSIS — E786 Lipoprotein deficiency: Secondary | ICD-10-CM | POA: Diagnosis not present

## 2015-04-29 DIAGNOSIS — E785 Hyperlipidemia, unspecified: Secondary | ICD-10-CM | POA: Diagnosis present

## 2015-04-29 DIAGNOSIS — Z91030 Bee allergy status: Secondary | ICD-10-CM | POA: Diagnosis not present

## 2015-04-29 DIAGNOSIS — I10 Essential (primary) hypertension: Secondary | ICD-10-CM | POA: Diagnosis present

## 2015-04-29 DIAGNOSIS — Z87891 Personal history of nicotine dependence: Secondary | ICD-10-CM | POA: Diagnosis not present

## 2015-04-29 DIAGNOSIS — Z885 Allergy status to narcotic agent status: Secondary | ICD-10-CM

## 2015-04-29 DIAGNOSIS — F329 Major depressive disorder, single episode, unspecified: Secondary | ICD-10-CM | POA: Diagnosis present

## 2015-04-29 DIAGNOSIS — Z951 Presence of aortocoronary bypass graft: Secondary | ICD-10-CM

## 2015-04-29 DIAGNOSIS — Z818 Family history of other mental and behavioral disorders: Secondary | ICD-10-CM

## 2015-04-29 DIAGNOSIS — D62 Acute posthemorrhagic anemia: Secondary | ICD-10-CM | POA: Diagnosis not present

## 2015-04-29 DIAGNOSIS — I080 Rheumatic disorders of both mitral and aortic valves: Secondary | ICD-10-CM | POA: Diagnosis not present

## 2015-04-29 DIAGNOSIS — Z79899 Other long term (current) drug therapy: Secondary | ICD-10-CM | POA: Diagnosis not present

## 2015-04-29 DIAGNOSIS — K219 Gastro-esophageal reflux disease without esophagitis: Secondary | ICD-10-CM | POA: Diagnosis present

## 2015-04-29 HISTORY — PX: TEE WITHOUT CARDIOVERSION: SHX5443

## 2015-04-29 HISTORY — PX: CORONARY ARTERY BYPASS GRAFT: SHX141

## 2015-04-29 LAB — CBC
HEMATOCRIT: 35.7 % — AB (ref 39.0–52.0)
HEMATOCRIT: 32 % — AB (ref 39.0–52.0)
HEMOGLOBIN: 11 g/dL — AB (ref 13.0–17.0)
Hemoglobin: 12.2 g/dL — ABNORMAL LOW (ref 13.0–17.0)
MCH: 31.8 pg (ref 26.0–34.0)
MCH: 31.9 pg (ref 26.0–34.0)
MCHC: 34.2 g/dL (ref 30.0–36.0)
MCHC: 34.4 g/dL (ref 30.0–36.0)
MCV: 93 fL (ref 78.0–100.0)
MCV: 92.8 fL (ref 78.0–100.0)
PLATELETS: 147 10*3/uL — AB (ref 150–400)
Platelets: 146 10*3/uL — ABNORMAL LOW (ref 150–400)
RBC: 3.84 MIL/uL — ABNORMAL LOW (ref 4.22–5.81)
RBC: 3.45 MIL/uL — AB (ref 4.22–5.81)
RDW: 13.5 % (ref 11.5–15.5)
RDW: 13.4 % (ref 11.5–15.5)
WBC: 13 10*3/uL — ABNORMAL HIGH (ref 4.0–10.5)
WBC: 11.4 10*3/uL — ABNORMAL HIGH (ref 4.0–10.5)

## 2015-04-29 LAB — POCT I-STAT, CHEM 8
BUN: 10 mg/dL (ref 6–20)
BUN: 9 mg/dL (ref 6–20)
BUN: 10 mg/dL (ref 6–20)
BUN: 8 mg/dL (ref 6–20)
BUN: 10 mg/dL (ref 6–20)
BUN: 10 mg/dL (ref 6–20)
CALCIUM ION: 1.28 mmol/L (ref 1.13–1.30)
CALCIUM ION: 1.1 mmol/L — AB (ref 1.13–1.30)
CALCIUM ION: 1.14 mmol/L (ref 1.13–1.30)
CHLORIDE: 103 mmol/L (ref 101–111)
CHLORIDE: 103 mmol/L (ref 101–111)
CHLORIDE: 101 mmol/L (ref 101–111)
CHLORIDE: 98 mmol/L — AB (ref 101–111)
CHLORIDE: 102 mmol/L (ref 101–111)
CREATININE: 0.7 mg/dL (ref 0.61–1.24)
CREATININE: 0.7 mg/dL (ref 0.61–1.24)
CREATININE: 0.8 mg/dL (ref 0.61–1.24)
Calcium, Ion: 1.27 mmol/L (ref 1.13–1.30)
Calcium, Ion: 1.08 mmol/L — ABNORMAL LOW (ref 1.13–1.30)
Calcium, Ion: 1.13 mmol/L (ref 1.13–1.30)
Chloride: 100 mmol/L — ABNORMAL LOW (ref 101–111)
Creatinine, Ser: 0.8 mg/dL (ref 0.61–1.24)
Creatinine, Ser: 0.9 mg/dL (ref 0.61–1.24)
Creatinine, Ser: 0.8 mg/dL (ref 0.61–1.24)
GLUCOSE: 105 mg/dL — AB (ref 65–99)
GLUCOSE: 121 mg/dL — AB (ref 65–99)
GLUCOSE: 116 mg/dL — AB (ref 65–99)
Glucose, Bld: 125 mg/dL — ABNORMAL HIGH (ref 65–99)
Glucose, Bld: 113 mg/dL — ABNORMAL HIGH (ref 65–99)
Glucose, Bld: 120 mg/dL — ABNORMAL HIGH (ref 65–99)
HCT: 35 % — ABNORMAL LOW (ref 39.0–52.0)
HCT: 27 % — ABNORMAL LOW (ref 39.0–52.0)
HCT: 28 % — ABNORMAL LOW (ref 39.0–52.0)
HCT: 32 % — ABNORMAL LOW (ref 39.0–52.0)
HEMATOCRIT: 34 % — AB (ref 39.0–52.0)
HEMATOCRIT: 26 % — AB (ref 39.0–52.0)
HEMOGLOBIN: 9.5 g/dL — AB (ref 13.0–17.0)
HEMOGLOBIN: 10.9 g/dL — AB (ref 13.0–17.0)
Hemoglobin: 11.6 g/dL — ABNORMAL LOW (ref 13.0–17.0)
Hemoglobin: 11.9 g/dL — ABNORMAL LOW (ref 13.0–17.0)
Hemoglobin: 8.8 g/dL — ABNORMAL LOW (ref 13.0–17.0)
Hemoglobin: 9.2 g/dL — ABNORMAL LOW (ref 13.0–17.0)
POTASSIUM: 4.3 mmol/L (ref 3.5–5.1)
POTASSIUM: 4.6 mmol/L (ref 3.5–5.1)
POTASSIUM: 4.4 mmol/L (ref 3.5–5.1)
POTASSIUM: 4.5 mmol/L (ref 3.5–5.1)
Potassium: 6.4 mmol/L (ref 3.5–5.1)
Potassium: 6.1 mmol/L (ref 3.5–5.1)
SODIUM: 136 mmol/L (ref 135–145)
SODIUM: 137 mmol/L (ref 135–145)
Sodium: 138 mmol/L (ref 135–145)
Sodium: 134 mmol/L — ABNORMAL LOW (ref 135–145)
Sodium: 135 mmol/L (ref 135–145)
Sodium: 134 mmol/L — ABNORMAL LOW (ref 135–145)
TCO2: 24 mmol/L (ref 0–100)
TCO2: 23 mmol/L (ref 0–100)
TCO2: 24 mmol/L (ref 0–100)
TCO2: 25 mmol/L (ref 0–100)
TCO2: 21 mmol/L (ref 0–100)
TCO2: 21 mmol/L (ref 0–100)

## 2015-04-29 LAB — POCT I-STAT 3, ART BLOOD GAS (G3+)
ACID-BASE DEFICIT: 2 mmol/L (ref 0.0–2.0)
ACID-BASE DEFICIT: 4 mmol/L — AB (ref 0.0–2.0)
Acid-base deficit: 3 mmol/L — ABNORMAL HIGH (ref 0.0–2.0)
Acid-base deficit: 4 mmol/L — ABNORMAL HIGH (ref 0.0–2.0)
BICARBONATE: 24.1 meq/L — AB (ref 20.0–24.0)
BICARBONATE: 21.7 meq/L (ref 20.0–24.0)
Bicarbonate: 22.4 mEq/L (ref 20.0–24.0)
Bicarbonate: 21.8 mEq/L (ref 20.0–24.0)
O2 SAT: 100 %
O2 SAT: 96 %
O2 Saturation: 96 %
O2 Saturation: 97 %
PCO2 ART: 44.7 mmHg (ref 35.0–45.0)
PCO2 ART: 41.4 mmHg (ref 35.0–45.0)
PCO2 ART: 42 mmHg (ref 35.0–45.0)
PH ART: 7.332 — AB (ref 7.350–7.450)
PH ART: 7.339 — AB (ref 7.350–7.450)
PO2 ART: 304 mmHg — AB (ref 80.0–100.0)
PO2 ART: 85 mmHg (ref 80.0–100.0)
Patient temperature: 36.7
TCO2: 25 mmol/L (ref 0–100)
TCO2: 23 mmol/L (ref 0–100)
TCO2: 24 mmol/L (ref 0–100)
TCO2: 23 mmol/L (ref 0–100)
pCO2 arterial: 40.6 mmHg (ref 35.0–45.0)
pH, Arterial: 7.339 — ABNORMAL LOW (ref 7.350–7.450)
pH, Arterial: 7.322 — ABNORMAL LOW (ref 7.350–7.450)
pO2, Arterial: 82 mmHg (ref 80.0–100.0)
pO2, Arterial: 93 mmHg (ref 80.0–100.0)

## 2015-04-29 LAB — HEMOGLOBIN A1C
Hgb A1c MFr Bld: 5.7 % — ABNORMAL HIGH (ref 4.8–5.6)
Mean Plasma Glucose: 117 mg/dL

## 2015-04-29 LAB — CREATININE, SERUM: CREATININE: 0.96 mg/dL (ref 0.61–1.24)

## 2015-04-29 LAB — APTT: APTT: 29 s (ref 24–37)

## 2015-04-29 LAB — POCT I-STAT 4, (NA,K, GLUC, HGB,HCT)
GLUCOSE: 131 mg/dL — AB (ref 65–99)
HEMATOCRIT: 36 % — AB (ref 39.0–52.0)
HEMOGLOBIN: 12.2 g/dL — AB (ref 13.0–17.0)
Potassium: 5.1 mmol/L (ref 3.5–5.1)
Sodium: 136 mmol/L (ref 135–145)

## 2015-04-29 LAB — HEMOGLOBIN AND HEMATOCRIT, BLOOD
HEMATOCRIT: 26.3 % — AB (ref 39.0–52.0)
Hemoglobin: 9.2 g/dL — ABNORMAL LOW (ref 13.0–17.0)

## 2015-04-29 LAB — PROTIME-INR
INR: 1.55 — ABNORMAL HIGH (ref 0.00–1.49)
Prothrombin Time: 18.6 seconds — ABNORMAL HIGH (ref 11.6–15.2)

## 2015-04-29 LAB — PLATELET COUNT: PLATELETS: 134 10*3/uL — AB (ref 150–400)

## 2015-04-29 LAB — MAGNESIUM: Magnesium: 2.7 mg/dL — ABNORMAL HIGH (ref 1.7–2.4)

## 2015-04-29 SURGERY — CORONARY ARTERY BYPASS GRAFTING (CABG)
Anesthesia: General | Site: Chest

## 2015-04-29 MED ORDER — SODIUM CHLORIDE 0.45 % IV SOLN
INTRAVENOUS | Status: DC | PRN
  Administered 2015-04-29: 20 mL/h via INTRAVENOUS

## 2015-04-29 MED ORDER — INFLUENZA VAC SPLIT QUAD 0.5 ML IM SUSY: 0.5000 mL | PREFILLED_SYRINGE | INTRAMUSCULAR | Status: DC

## 2015-04-29 MED ORDER — ACETAMINOPHEN 160 MG/5ML PO SOLN: 1000.0000 mg | Freq: Four times a day (QID) | ORAL | Status: DC

## 2015-04-29 MED ORDER — DEXMEDETOMIDINE HCL IN NACL 200 MCG/50ML IV SOLN
0.4000 ug/kg/h | INTRAVENOUS | Status: DC
  Filled 2015-04-29: qty 50

## 2015-04-29 MED ORDER — CHLORHEXIDINE GLUCONATE 0.12 % MT SOLN
15.0000 mL | OROMUCOSAL | Status: AC
Start: 2015-04-29 — End: 2015-04-29
  Administered 2015-04-29: 15 mL via OROMUCOSAL

## 2015-04-29 MED ORDER — SODIUM CHLORIDE 0.9 % IV SOLN: INTRAVENOUS | Status: DC

## 2015-04-29 MED ORDER — POTASSIUM CHLORIDE 10 MEQ/50ML IV SOLN: 10.0000 meq | INTRAVENOUS | Status: AC

## 2015-04-29 MED ORDER — PROPOFOL 10 MG/ML IV BOLUS
INTRAVENOUS | Status: DC | PRN
  Administered 2015-04-29: 40 mg via INTRAVENOUS

## 2015-04-29 MED ORDER — EPHEDRINE SULFATE 50 MG/ML IJ SOLN
INTRAMUSCULAR | Status: DC | PRN
  Administered 2015-04-29: 5 mg via INTRAVENOUS
  Administered 2015-04-29: 15 mg via INTRAVENOUS
  Administered 2015-04-29: 5 mg via INTRAVENOUS

## 2015-04-29 MED ORDER — MUPIROCIN 2 % EX OINT
TOPICAL_OINTMENT | CUTANEOUS | Status: AC
  Administered 2015-04-29: 637
  Filled 2015-04-29: qty 22

## 2015-04-29 MED ORDER — HEPARIN SODIUM (PORCINE) 1000 UNIT/ML IJ SOLN
INTRAMUSCULAR | Status: DC | PRN
  Administered 2015-04-29: 32000 [IU] via INTRAVENOUS

## 2015-04-29 MED ORDER — PHENYLEPHRINE HCL 10 MG/ML IJ SOLN
0.0000 ug/min | INTRAVENOUS | Status: DC
  Filled 2015-04-29 (×3): qty 2

## 2015-04-29 MED ORDER — LACTATED RINGERS IV SOLN: 500.0000 mL | Freq: Once | INTRAVENOUS | Status: DC | PRN

## 2015-04-29 MED ORDER — PANTOPRAZOLE SODIUM 40 MG PO TBEC
40.0000 mg | DELAYED_RELEASE_TABLET | Freq: Every day | ORAL | Status: DC
  Administered 2015-04-30 – 2015-05-03 (×4): 40 mg via ORAL
  Filled 2015-04-29 (×4): qty 1

## 2015-04-29 MED ORDER — FENTANYL CITRATE (PF) 250 MCG/5ML IJ SOLN
INTRAMUSCULAR | Status: AC
  Filled 2015-04-29: qty 5

## 2015-04-29 MED ORDER — ACETAMINOPHEN 160 MG/5ML PO SOLN: 650.0000 mg | Freq: Once | ORAL | Status: AC

## 2015-04-29 MED ORDER — ONDANSETRON HCL 4 MG/2ML IJ SOLN
4.0000 mg | Freq: Four times a day (QID) | INTRAMUSCULAR | Status: DC | PRN
  Administered 2015-04-30: 4 mg via INTRAVENOUS
  Filled 2015-04-29: qty 2

## 2015-04-29 MED ORDER — ATORVASTATIN CALCIUM 40 MG PO TABS
40.0000 mg | ORAL_TABLET | Freq: Every day | ORAL | Status: DC
  Administered 2015-04-30 – 2015-05-02 (×3): 40 mg via ORAL
  Filled 2015-04-29 (×4): qty 1

## 2015-04-29 MED ORDER — DOCUSATE SODIUM 100 MG PO CAPS
200.0000 mg | ORAL_CAPSULE | Freq: Every day | ORAL | Status: DC
  Administered 2015-04-30 – 2015-05-01 (×2): 200 mg via ORAL
  Filled 2015-04-29 (×2): qty 2

## 2015-04-29 MED ORDER — MAGNESIUM SULFATE 4 GM/100ML IV SOLN
4.0000 g | Freq: Once | INTRAVENOUS | Status: AC
  Administered 2015-04-29: 4 g via INTRAVENOUS
  Filled 2015-04-29: qty 100

## 2015-04-29 MED ORDER — ROCURONIUM BROMIDE 100 MG/10ML IV SOLN
INTRAVENOUS | Status: DC | PRN
  Administered 2015-04-29: 100 mg via INTRAVENOUS
  Administered 2015-04-29: 20 mg via INTRAVENOUS
  Administered 2015-04-29: 30 mg via INTRAVENOUS

## 2015-04-29 MED ORDER — HEPARIN SODIUM (PORCINE) 1000 UNIT/ML IJ SOLN
INTRAMUSCULAR | Status: AC
  Filled 2015-04-29: qty 1

## 2015-04-29 MED ORDER — THROMBIN 20000 UNITS EX SOLR
CUTANEOUS | Status: DC | PRN
  Administered 2015-04-29: 20000 [IU] via TOPICAL

## 2015-04-29 MED ORDER — CETYLPYRIDINIUM CHLORIDE 0.05 % MT LIQD
7.0000 mL | Freq: Two times a day (BID) | OROMUCOSAL | Status: DC
  Administered 2015-04-29 – 2015-05-03 (×7): 7 mL via OROMUCOSAL

## 2015-04-29 MED ORDER — ANTISEPTIC ORAL RINSE SOLUTION (CORINZ)
7.0000 mL | Freq: Four times a day (QID) | OROMUCOSAL | Status: DC
  Administered 2015-04-29: 7 mL via OROMUCOSAL

## 2015-04-29 MED ORDER — METOPROLOL TARTRATE 25 MG/10 ML ORAL SUSPENSION
12.5000 mg | Freq: Two times a day (BID) | ORAL | Status: DC
  Filled 2015-04-29 (×2): qty 5

## 2015-04-29 MED ORDER — NITROGLYCERIN IN D5W 200-5 MCG/ML-% IV SOLN: 0.0000 ug/min | INTRAVENOUS | Status: DC

## 2015-04-29 MED ORDER — LACTATED RINGERS IV SOLN: INTRAVENOUS | Status: DC

## 2015-04-29 MED ORDER — BISACODYL 10 MG RE SUPP
10.0000 mg | Freq: Every day | RECTAL | Status: DC
Start: 2015-04-30 — End: 2015-05-03

## 2015-04-29 MED ORDER — LACTATED RINGERS IV SOLN
INTRAVENOUS | Status: DC | PRN
  Administered 2015-04-29: 07:00:00 via INTRAVENOUS

## 2015-04-29 MED ORDER — MIDAZOLAM HCL 10 MG/2ML IJ SOLN
INTRAMUSCULAR | Status: AC
  Filled 2015-04-29: qty 4

## 2015-04-29 MED ORDER — SODIUM CHLORIDE 0.9 % IJ SOLN
3.0000 mL | Freq: Two times a day (BID) | INTRAMUSCULAR | Status: DC
  Administered 2015-04-30 – 2015-05-01 (×2): 3 mL via INTRAVENOUS

## 2015-04-29 MED ORDER — THROMBIN 20000 UNITS EX SOLR
CUTANEOUS | Status: DC | PRN
  Administered 2015-04-29 (×3): 6 mL via TOPICAL

## 2015-04-29 MED ORDER — FAMOTIDINE IN NACL 20-0.9 MG/50ML-% IV SOLN
20.0000 mg | Freq: Two times a day (BID) | INTRAVENOUS | Status: AC
  Administered 2015-04-29: 20 mg via INTRAVENOUS

## 2015-04-29 MED ORDER — FENTANYL CITRATE (PF) 100 MCG/2ML IJ SOLN
INTRAMUSCULAR | Status: DC | PRN
  Administered 2015-04-29: 100 ug via INTRAVENOUS
  Administered 2015-04-29: 350 ug via INTRAVENOUS
  Administered 2015-04-29: 100 ug via INTRAVENOUS
  Administered 2015-04-29: 150 ug via INTRAVENOUS
  Administered 2015-04-29: 50 ug via INTRAVENOUS
  Administered 2015-04-29: 150 ug via INTRAVENOUS
  Administered 2015-04-29: 100 ug via INTRAVENOUS

## 2015-04-29 MED ORDER — ASPIRIN EC 325 MG PO TBEC
325.0000 mg | DELAYED_RELEASE_TABLET | Freq: Every day | ORAL | Status: DC
  Administered 2015-04-30 – 2015-05-03 (×4): 325 mg via ORAL
  Filled 2015-04-29 (×6): qty 1

## 2015-04-29 MED ORDER — THROMBIN 20000 UNITS EX SOLR
CUTANEOUS | Status: AC
  Filled 2015-04-29: qty 20000

## 2015-04-29 MED ORDER — ALBUMIN HUMAN 5 % IV SOLN
250.0000 mL | INTRAVENOUS | Status: AC | PRN
  Administered 2015-04-29 (×2): 250 mL via INTRAVENOUS
  Filled 2015-04-29: qty 250

## 2015-04-29 MED ORDER — PHENYLEPHRINE HCL 10 MG/ML IJ SOLN
20.0000 mg | INTRAVENOUS | Status: DC | PRN
  Administered 2015-04-29: 10 ug/min via INTRAVENOUS

## 2015-04-29 MED ORDER — BISACODYL 5 MG PO TBEC
10.0000 mg | DELAYED_RELEASE_TABLET | Freq: Every day | ORAL | Status: DC
  Administered 2015-04-30 – 2015-05-01 (×2): 10 mg via ORAL
  Filled 2015-04-29 (×3): qty 2

## 2015-04-29 MED ORDER — SODIUM CHLORIDE 0.9 % IV SOLN
250.0000 mL | INTRAVENOUS | Status: DC
  Administered 2015-04-30: 250 mL via INTRAVENOUS

## 2015-04-29 MED ORDER — METOPROLOL TARTRATE 1 MG/ML IV SOLN: 2.5000 mg | INTRAVENOUS | Status: DC | PRN

## 2015-04-29 MED ORDER — LEVOFLOXACIN IN D5W 750 MG/150ML IV SOLN
750.0000 mg | INTRAVENOUS | Status: AC
  Administered 2015-04-30: 750 mg via INTRAVENOUS
  Filled 2015-04-29: qty 150

## 2015-04-29 MED ORDER — VANCOMYCIN HCL IN DEXTROSE 1-5 GM/200ML-% IV SOLN
1000.0000 mg | Freq: Once | INTRAVENOUS | Status: AC
  Administered 2015-04-29: 1000 mg via INTRAVENOUS
  Filled 2015-04-29: qty 200

## 2015-04-29 MED ORDER — INFLUENZA VAC SPLIT QUAD 0.5 ML IM SUSY: 0.5000 mL | PREFILLED_SYRINGE | INTRAMUSCULAR | Status: DC | PRN

## 2015-04-29 MED ORDER — ACETAMINOPHEN 650 MG RE SUPP
650.0000 mg | Freq: Once | RECTAL | Status: AC
  Administered 2015-04-29: 650 mg via RECTAL

## 2015-04-29 MED ORDER — SODIUM CHLORIDE 0.9 % IJ SOLN: 3.0000 mL | INTRAMUSCULAR | Status: DC | PRN

## 2015-04-29 MED ORDER — FENTANYL CITRATE (PF) 100 MCG/2ML IJ SOLN
50.0000 ug | INTRAMUSCULAR | Status: DC | PRN
  Administered 2015-04-29 – 2015-04-30 (×6): 50 ug via INTRAVENOUS
  Filled 2015-04-29 (×4): qty 2

## 2015-04-29 MED ORDER — ACETAMINOPHEN 500 MG PO TABS
1000.0000 mg | ORAL_TABLET | Freq: Four times a day (QID) | ORAL | Status: DC
  Administered 2015-04-30 – 2015-05-03 (×14): 1000 mg via ORAL
  Filled 2015-04-29 (×18): qty 2

## 2015-04-29 MED ORDER — INSULIN REGULAR BOLUS VIA INFUSION
0.0000 [IU] | Freq: Three times a day (TID) | INTRAVENOUS | Status: DC
  Filled 2015-04-29: qty 10

## 2015-04-29 MED ORDER — PROTAMINE SULFATE 10 MG/ML IV SOLN
INTRAVENOUS | Status: DC | PRN
  Administered 2015-04-29: 250 mg via INTRAVENOUS

## 2015-04-29 MED ORDER — ASPIRIN 81 MG PO CHEW: 324.0000 mg | CHEWABLE_TABLET | Freq: Every day | ORAL | Status: DC

## 2015-04-29 MED ORDER — PROPOFOL 10 MG/ML IV BOLUS
INTRAVENOUS | Status: AC
  Filled 2015-04-29: qty 20

## 2015-04-29 MED ORDER — HEMOSTATIC AGENTS (NO CHARGE) OPTIME
TOPICAL | Status: DC | PRN
  Administered 2015-04-29: 1 via TOPICAL

## 2015-04-29 MED ORDER — SODIUM CHLORIDE 0.9 % IV SOLN
INTRAVENOUS | Status: DC
  Filled 2015-04-29 (×3): qty 2.5

## 2015-04-29 MED ORDER — 0.9 % SODIUM CHLORIDE (POUR BTL) OPTIME
TOPICAL | Status: DC | PRN
  Administered 2015-04-29: 6000 mL

## 2015-04-29 MED ORDER — DEXMEDETOMIDINE HCL IN NACL 200 MCG/50ML IV SOLN: 0.0000 ug/kg/h | INTRAVENOUS | Status: DC

## 2015-04-29 MED ORDER — MIDAZOLAM HCL 5 MG/5ML IJ SOLN
INTRAMUSCULAR | Status: DC | PRN
  Administered 2015-04-29: 4 mg via INTRAVENOUS
  Administered 2015-04-29: 1 mg via INTRAVENOUS
  Administered 2015-04-29: 3 mg via INTRAVENOUS
  Administered 2015-04-29: 2 mg via INTRAVENOUS

## 2015-04-29 MED ORDER — CHLORHEXIDINE GLUCONATE 0.12% ORAL RINSE (MEDLINE KIT): 15.0000 mL | Freq: Two times a day (BID) | OROMUCOSAL | Status: DC

## 2015-04-29 MED ORDER — METOPROLOL TARTRATE 12.5 MG HALF TABLET
12.5000 mg | ORAL_TABLET | Freq: Two times a day (BID) | ORAL | Status: DC
  Administered 2015-04-30 – 2015-05-03 (×6): 12.5 mg via ORAL
  Filled 2015-04-29 (×9): qty 1

## 2015-04-29 MED FILL — Electrolyte-R (PH 7.4) Solution: INTRAVENOUS | Qty: 3000 | Status: AC

## 2015-04-29 MED FILL — Sodium Chloride IV Soln 0.9%: INTRAVENOUS | Qty: 2000 | Status: AC

## 2015-04-29 MED FILL — Heparin Sodium (Porcine) Inj 1000 Unit/ML: INTRAMUSCULAR | Qty: 10 | Status: AC

## 2015-04-29 MED FILL — Mannitol IV Soln 20%: INTRAVENOUS | Qty: 500 | Status: AC

## 2015-04-29 MED FILL — Lidocaine HCl IV Inj 20 MG/ML: INTRAVENOUS | Qty: 5 | Status: AC

## 2015-04-29 SURGICAL SUPPLY — 111 items
BAG DECANTER FOR FLEXI CONT (MISCELLANEOUS) ×4 IMPLANT
BANDAGE ELASTIC 4 VELCRO ST LF (GAUZE/BANDAGES/DRESSINGS) ×4 IMPLANT
BANDAGE ELASTIC 6 VELCRO ST LF (GAUZE/BANDAGES/DRESSINGS) ×4 IMPLANT
BASKET HEART  (ORDER IN 25'S) (MISCELLANEOUS) ×1
BASKET HEART (ORDER IN 25'S) (MISCELLANEOUS) ×1
BASKET HEART (ORDER IN 25S) (MISCELLANEOUS) ×2 IMPLANT
BLADE STERNUM SYSTEM 6 (BLADE) ×4 IMPLANT
BNDG GAUZE ELAST 4 BULKY (GAUZE/BANDAGES/DRESSINGS) ×4 IMPLANT
CANISTER SUCTION 2500CC (MISCELLANEOUS) ×4 IMPLANT
CATH ROBINSON RED A/P 18FR (CATHETERS) ×8 IMPLANT
CATH THORACIC 28FR (CATHETERS) ×4 IMPLANT
CATH THORACIC 36FR (CATHETERS) ×4 IMPLANT
CATH THORACIC 36FR RT ANG (CATHETERS) ×4 IMPLANT
CLIP TI MEDIUM 24 (CLIP) IMPLANT
CLIP TI WIDE RED SMALL 24 (CLIP) ×4 IMPLANT
COVER SURGICAL LIGHT HANDLE (MISCELLANEOUS) ×4 IMPLANT
CRADLE DONUT ADULT HEAD (MISCELLANEOUS) ×4 IMPLANT
DRAPE CARDIOVASCULAR INCISE (DRAPES) ×2
DRAPE SLUSH/WARMER DISC (DRAPES) ×4 IMPLANT
DRAPE SRG 135X102X78XABS (DRAPES) ×2 IMPLANT
DRSG COVADERM 4X14 (GAUZE/BANDAGES/DRESSINGS) ×4 IMPLANT
DRSG KUZMA FLUFF (GAUZE/BANDAGES/DRESSINGS) ×4 IMPLANT
ELECT CAUTERY BLADE 6.4 (BLADE) ×4 IMPLANT
ELECT REM PT RETURN 9FT ADLT (ELECTROSURGICAL) ×8
ELECTRODE REM PT RTRN 9FT ADLT (ELECTROSURGICAL) ×4 IMPLANT
GAUZE SPONGE 4X4 12PLY STRL (GAUZE/BANDAGES/DRESSINGS) ×8 IMPLANT
GLOVE BIO SURGEON STRL SZ 6 (GLOVE) IMPLANT
GLOVE BIO SURGEON STRL SZ 6.5 (GLOVE) IMPLANT
GLOVE BIO SURGEON STRL SZ7 (GLOVE) IMPLANT
GLOVE BIO SURGEON STRL SZ7.5 (GLOVE) ×4 IMPLANT
GLOVE BIO SURGEONS STRL SZ 6.5 (GLOVE)
GLOVE BIOGEL PI IND STRL 6 (GLOVE) IMPLANT
GLOVE BIOGEL PI IND STRL 6.5 (GLOVE) IMPLANT
GLOVE BIOGEL PI IND STRL 7.0 (GLOVE) IMPLANT
GLOVE BIOGEL PI INDICATOR 6 (GLOVE)
GLOVE BIOGEL PI INDICATOR 6.5 (GLOVE)
GLOVE BIOGEL PI INDICATOR 7.0 (GLOVE)
GLOVE EUDERMIC 7 POWDERFREE (GLOVE) ×8 IMPLANT
GLOVE ORTHO TXT STRL SZ7.5 (GLOVE) IMPLANT
GOWN STRL REUS W/ TWL LRG LVL3 (GOWN DISPOSABLE) ×8 IMPLANT
GOWN STRL REUS W/ TWL XL LVL3 (GOWN DISPOSABLE) ×2 IMPLANT
GOWN STRL REUS W/TWL LRG LVL3 (GOWN DISPOSABLE) ×8
GOWN STRL REUS W/TWL XL LVL3 (GOWN DISPOSABLE) ×2
HEMOSTAT POWDER SURGIFOAM 1G (HEMOSTASIS) ×12 IMPLANT
HEMOSTAT SURGICEL 2X14 (HEMOSTASIS) ×4 IMPLANT
INSERT FOGARTY 61MM (MISCELLANEOUS) IMPLANT
INSERT FOGARTY XLG (MISCELLANEOUS) ×4 IMPLANT
KIT BASIN OR (CUSTOM PROCEDURE TRAY) ×4 IMPLANT
KIT CATH CPB BARTLE (MISCELLANEOUS) ×4 IMPLANT
KIT ROOM TURNOVER OR (KITS) ×4 IMPLANT
KIT SUCTION CATH 14FR (SUCTIONS) ×4 IMPLANT
KIT VASOVIEW W/TROCAR VH 2000 (KITS) ×4 IMPLANT
NEEDLE 27GAX1/2IN MONOJET (NEEDLE) ×4 IMPLANT
NS IRRIG 1000ML POUR BTL (IV SOLUTION) ×20 IMPLANT
PACK OPEN HEART (CUSTOM PROCEDURE TRAY) ×4 IMPLANT
PAD ARMBOARD 7.5X6 YLW CONV (MISCELLANEOUS) ×8 IMPLANT
PAD ELECT DEFIB RADIOL ZOLL (MISCELLANEOUS) ×4 IMPLANT
PENCIL BUTTON HOLSTER BLD 10FT (ELECTRODE) ×4 IMPLANT
PUNCH AORTIC ROTATE 4.0MM (MISCELLANEOUS) IMPLANT
PUNCH AORTIC ROTATE 4.5MM 8IN (MISCELLANEOUS) ×4 IMPLANT
PUNCH AORTIC ROTATE 5MM 8IN (MISCELLANEOUS) IMPLANT
SET CARDIOPLEGIA MPS 5001102 (MISCELLANEOUS) ×4 IMPLANT
SPONGE GAUZE 4X4 12PLY STER LF (GAUZE/BANDAGES/DRESSINGS) ×8 IMPLANT
SPONGE INTESTINAL PEANUT (DISPOSABLE) IMPLANT
SPONGE LAP 18X18 X RAY DECT (DISPOSABLE) IMPLANT
SPONGE LAP 4X18 X RAY DECT (DISPOSABLE) ×4 IMPLANT
SUT BONE WAX W31G (SUTURE) ×4 IMPLANT
SUT ETHIBOND 2 0 SH (SUTURE) ×8
SUT ETHIBOND 2 0 SH 36X2 (SUTURE) ×8 IMPLANT
SUT MNCRL AB 4-0 PS2 18 (SUTURE) IMPLANT
SUT PROLENE 3 0 SH DA (SUTURE) IMPLANT
SUT PROLENE 3 0 SH1 36 (SUTURE) ×4 IMPLANT
SUT PROLENE 4 0 RB 1 (SUTURE) ×2
SUT PROLENE 4 0 SH DA (SUTURE) IMPLANT
SUT PROLENE 4-0 RB1 .5 CRCL 36 (SUTURE) ×2 IMPLANT
SUT PROLENE 5 0 C 1 36 (SUTURE) ×8 IMPLANT
SUT PROLENE 6 0 C 1 30 (SUTURE) ×8 IMPLANT
SUT PROLENE 7 0 BV 1 (SUTURE) IMPLANT
SUT PROLENE 7 0 BV1 MDA (SUTURE) ×4 IMPLANT
SUT PROLENE 8 0 BV175 6 (SUTURE) ×8 IMPLANT
SUT SILK  1 MH (SUTURE) ×6
SUT SILK 1 MH (SUTURE) ×6 IMPLANT
SUT SILK 1 TIES 10X30 (SUTURE) ×4 IMPLANT
SUT SILK 2 0 SH CR/8 (SUTURE) ×8 IMPLANT
SUT SILK 2 0 TIES 10X30 (SUTURE) ×4 IMPLANT
SUT SILK 2 0 TIES 17X18 (SUTURE) ×2
SUT SILK 2-0 18XBRD TIE BLK (SUTURE) ×2 IMPLANT
SUT SILK 3 0 SH CR/8 (SUTURE) ×4 IMPLANT
SUT SILK 4 0 TIE 10X30 (SUTURE) ×8 IMPLANT
SUT STEEL STERNAL CCS#1 18IN (SUTURE) IMPLANT
SUT STEEL SZ 6 DBL 3X14 BALL (SUTURE) IMPLANT
SUT TEM PAC WIRE 2 0 SH (SUTURE) ×16 IMPLANT
SUT VIC AB 1 CTX 36 (SUTURE) ×6
SUT VIC AB 1 CTX36XBRD ANBCTR (SUTURE) ×6 IMPLANT
SUT VIC AB 2-0 CT1 27 (SUTURE) ×2
SUT VIC AB 2-0 CT1 TAPERPNT 27 (SUTURE) ×2 IMPLANT
SUT VIC AB 2-0 CTX 27 (SUTURE) ×8 IMPLANT
SUT VIC AB 3-0 SH 27 (SUTURE)
SUT VIC AB 3-0 SH 27X BRD (SUTURE) IMPLANT
SUT VIC AB 3-0 X1 27 (SUTURE) ×12 IMPLANT
SUT VICRYL 4-0 PS2 18IN ABS (SUTURE) IMPLANT
SUTURE E-PAK OPEN HEART (SUTURE) ×4 IMPLANT
SYSTEM SAHARA CHEST DRAIN ATS (WOUND CARE) ×4 IMPLANT
TAPE CLOTH SURG 4X10 WHT LF (GAUZE/BANDAGES/DRESSINGS) ×8 IMPLANT
TAPE PAPER 2X10 WHT MICROPORE (GAUZE/BANDAGES/DRESSINGS) ×4 IMPLANT
TOWEL OR 17X24 6PK STRL BLUE (TOWEL DISPOSABLE) ×4 IMPLANT
TOWEL OR 17X26 10 PK STRL BLUE (TOWEL DISPOSABLE) ×4 IMPLANT
TRAY FOLEY IC TEMP SENS 16FR (CATHETERS) ×4 IMPLANT
TUBING INSUFFLATION (TUBING) ×4 IMPLANT
UNDERPAD 30X30 INCONTINENT (UNDERPADS AND DIAPERS) ×4 IMPLANT
WATER STERILE IRR 1000ML POUR (IV SOLUTION) ×8 IMPLANT

## 2015-04-29 NOTE — Interval H&P Note (Signed)
History and Physical Interval Note:  04/29/2015 5:47 AM  Dennis York  has presented today for surgery, with the diagnosis of CAD  The various methods of treatment have been discussed with the patient and family. After consideration of risks, benefits and other options for treatment, the patient has consented to  Procedure(s): CORONARY ARTERY BYPASS GRAFTING (CABG) (N/A) TRANSESOPHAGEAL ECHOCARDIOGRAM (TEE) (N/A) as a surgical intervention .  The patient's history has been reviewed, patient examined, no change in status, stable for surgery.  I have reviewed the patient's chart and labs.  Questions were answered to the patient's satisfaction.     [name]

## 2015-04-29 NOTE — Transfer of Care (Signed)
Immediate Anesthesia Transfer of Care Note  Patient: Dennis York  Procedure(s) Performed: Procedure(s): CORONARY ARTERY BYPASS GRAFTING (CABG) x2 using left internal mammory artery and right saphenous leg vein harvested endoscopically (N/A) TRANSESOPHAGEAL ECHOCARDIOGRAM (TEE) (N/A)  Patient Location: ICU  Anesthesia Type:General  Level of Consciousness: sedated and Patient remains intubated per anesthesia plan  Airway & Oxygen Therapy: Patient remains intubated per anesthesia plan and Patient placed on Ventilator (see vital sign flow sheet for setting)  Post-op Assessment: Report given to RN and Post -op Vital signs reviewed and stable  Post vital signs: Reviewed and stable  Last Vitals:  Filed Vitals:   04/29/15 0643  BP: 124/57  Pulse: 66  Temp: 36.7 C  Resp: 18    Complications: No apparent anesthesia complications

## 2015-04-29 NOTE — Progress Notes (Signed)
[facility] BRIEF SICU PROGRESS NOTE  Day of Surgery  S/P Procedure(s) (LRB): CORONARY ARTERY BYPASS GRAFTING (CABG) x2 using left internal mammory artery and right saphenous leg vein harvested endoscopically (N/A) TRANSESOPHAGEAL ECHOCARDIOGRAM (TEE) (N/A)   Stable post op Extubated uneventfully AV paced w/ stable hemodynamics, no drips Chest tube output low UOP adequate Labs okay  Plan: Continue routine early postop  Rexene Alberts, MD 04/29/2015 7:24 PM

## 2015-04-29 NOTE — Op Note (Signed)
CARDIOVASCULAR SURGERY OPERATIVE NOTE  04/29/2015  Surgeon:  [name], MD  First Assistant: Jadene Pierini,  PA-C   Preoperative Diagnosis:  Severe left main and LAD coronary artery disease   Postoperative Diagnosis:  Same   Procedure:  1. Median Sternotomy 2. Extracorporeal circulation 3.   Coronary artery bypass grafting x 2   Left internal mammary graft to the LAD  SVG to OM   4.   Endoscopic vein harvest from the right leg   Anesthesia:  General Endotracheal   Clinical History/Surgical Indication:  The patient is a 74 year old active gentleman with a history of hyperlipidemia, hypertension and coronary disease s/p PCI of the LAD in 1994. He was followed be Dr. Doreatha Lew until his retirement and did well until recently. He has gone to the [facility] 3 days per week and done vigorous workouts until a few weeks ago when he noted some tightness across his chest after riding an exercise bike for 25 min. He went back to the gym the next day and had the same symptom so he went to his a cardiologist in [place] and had a stress tiest that was positive with a defect in the mid to distal LAD territory and ST depression. Cardiac cath on 04/22/2015 showed a 60% highly calcified LM Stenosis and a 95% ostial and proximal LAD stenosis that was very calcified. The LCX had no significant stenosis itself and the RCA had mild non-obstructive disease. LVEF was 64%. He has only had symptoms with heavy exertion and has subsequently avoided that.  I have personally reviewed his cardiac cath and stress test. He has severe calcification of the left main and proximal LAD with 60% ostial LM stenosis and a long ostial and proximal 95% LAD stenosis. He has had angina with heavy exertion and is a physically active gentleman so he has had to stop this which is really limiting his lifestyle. I agree that CABG is  indicated to prevent ischemia, infarction and death as well as to improve his quality of life. I discussed the operative procedure with the patient and his wife including alternatives, benefits and risks; including but not limited to bleeding, blood transfusion, infection, stroke, myocardial infarction, graft failure, heart block requiring a permanent pacemaker, organ dysfunction, and death. Evlyn Kanner understands and agrees to proceed.    Preparation:  The patient was seen in the preoperative holding area and the correct patient, correct operation were confirmed with the patient after reviewing the medical record and catheterization. The consent was signed by me. Preoperative antibiotics were given. A pulmonary arterial line and radial arterial line were placed by the anesthesia team. The patient was taken back to the operating room and positioned supine on the operating room table. After being placed under general endotracheal anesthesia by the anesthesia team a foley catheter was placed. The neck, chest, abdomen, and both legs were prepped with betadine soap and solution and draped in the usual sterile manner. A surgical time-out was taken and the correct patient and operative procedure were confirmed with the nursing and anesthesia staff.   Cardiopulmonary Bypass:  A median sternotomy was performed. The pericardium was opened in the midline. Right ventricular function appeared normal. The ascending aorta was of normal size and had significant calcified plaque in the aortic root and along the right lateral ascending aortic wall and posteriorly. There was a soft area in the distal ascending aorta beyond this calcified plaque for cannulation and cross clamping. There were no contraindications to aortic  cannulation or cross-clamping. The patient was fully systemically heparinized and the ACT was maintained > 400 sec. The proximal aortic arch was cannulated with a 20 F aortic cannula for arterial  inflow. Venous cannulation was performed via the right atrial appendage using a two-staged venous cannula. An antegrade cardioplegia/vent cannula was inserted into the mid-ascending aorta. Aortic occlusion was performed with a single cross-clamp. Systemic cooling to 32 degrees Centigrade and topical cooling of the heart with iced saline were used. Hyperkalemic antegrade cold blood cardioplegia was used to induce diastolic arrest and was then given at about 20 minute intervals throughout the period of arrest to maintain myocardial temperature at or below 10 degrees centigrade. A temperature probe was inserted into the interventricular septum and an insulating pad was placed in the pericardium.   Left internal mammary harvest:  The left side of the sternum was retracted using the Rultract retractor. The left internal mammary artery was harvested as a pedicle graft. All side branches were clipped. It was a medium-sized vessel of good quality with excellent blood flow. It was ligated distally and divided. It was sprayed with topical papaverine solution to prevent vasospasm.   Endoscopic vein harvest:  The right greater saphenous vein was harvested endoscopically through a 2 cm incision medial to the right knee. It was harvested from the upper thigh to below the knee. It was a medium-sized vein of good quality. The side branches were all ligated with 4-0 silk ties.    Coronary arteries:  The coronary arteries were examined.   LAD:  Large vessel with no distal disease  LCX:  OM large with two sub-branches. The first branch was epicardial and the second was intramyocardial. They both communicated on cath so I grafted the first branch since it was on the surface.  RCA:  Mild segmental plaque but no significant stenosis on cath.   Grafts:  1. LIMA to the LAD: 2.0 mm. It was sewn end to side using 8-0 prolene continuous suture. 2. SVG to OM:  1.75 mm. It was sewn end to side using 7-0 prolene  continuous suture.   The proximal vein graft anastomosis was performed to the mid-ascending aorta using continuous 6-0 prolene suture. Graft marker was placed around the proximal anastomosis.   Completion:  The patient was rewarmed to 37 degrees Centigrade. The clamp was removed from the LIMA pedicle and there was rapid warming of the septum and return of ventricular fibrillation. The crossclamp was removed with a time of 42 minutes. There was spontaneous return of sinus rhythm. The distal and proximal anastomoses were checked for hemostasis. The position of the grafts was satisfactory. Two temporary epicardial pacing wires were placed on the right atrium and two on the right ventricle. The patient was weaned from CPB without difficulty on no inotropes. CPB time was 62 minutes. Cardiac output was 6 LPM. TEE showed normal LV function. Heparin was fully reversed with protamine and the aortic and venous cannulas removed. Hemostasis was achieved. Mediastinal and left pleural drainage tubes were placed. The sternum was closed with double #6 stainless steel wires. The fascia was closed with continuous # 1 vicryl suture. The subcutaneous tissue was closed with 2-0 vicryl continuous suture. The skin was closed with 3-0 vicryl subcuticular suture. All sponge, needle, and instrument counts were reported correct at the end of the case. Dry sterile dressings were placed over the incisions and around the chest tubes which were connected to pleurevac suction. The patient was then transported to the surgical  intensive care unit in critical but stable condition.

## 2015-04-29 NOTE — Anesthesia Procedure Notes (Signed)
Procedure Name: Intubation Date/Time: 04/29/2015 8:01 AM Performed by: Tressia Miners LEFFEW Pre-anesthesia Checklist: Patient identified, Emergency Drugs available, Suction available, Patient being monitored and Timeout performed Patient Re-evaluated:Patient Re-evaluated prior to inductionOxygen Delivery Method: Circle system utilized Preoxygenation: Pre-oxygenation with 100% oxygen Intubation Type: IV induction Laryngoscope Size: Mac Grade View: Grade III Tube size: 8.0 mm Number of attempts: 1 Airway Equipment and Method: Bougie stylet Placement Confirmation: positive ETCO2 and breath sounds checked- equal and bilateral Secured at: 22 cm Tube secured with: Tape Dental Injury: Teeth and Oropharynx as per pre-operative assessment

## 2015-04-29 NOTE — OR Nursing (Signed)
Off-pump call to SICU at 1041. Retractor-out call to SICU at 1106.

## 2015-04-29 NOTE — Anesthesia Preprocedure Evaluation (Addendum)
Anesthesia Evaluation  Patient identified by MRN, date of birth, ID band Patient awake    Reviewed: Allergy & Precautions, H&P , NPO status , Patient's Chart, lab work & pertinent test results, reviewed documented beta blocker date and time   History of Anesthesia Complications Negative for: history of anesthetic complications  Airway Mallampati: II  TM Distance: >3 FB Neck ROM: Full    Dental  (+) Dental Advisory Given, Poor Dentition   Pulmonary former smoker,    Pulmonary exam normal breath sounds clear to auscultation       Cardiovascular hypertension, Pt. on medications and Pt. on home beta blockers + CAD and + Cardiac Stents  Normal cardiovascular exam Rhythm:Regular Rate:Normal     Neuro/Psych Anxiety Depression    GI/Hepatic Neg liver ROS, hiatal hernia, GERD  Medicated and Controlled,  Endo/Other  negative endocrine ROS  Renal/GU Renal diseasenegative Renal ROS     Musculoskeletal   Abdominal   Peds  Hematology   Anesthesia Other Findings   Reproductive/Obstetrics                            Anesthesia Physical  Anesthesia Plan  ASA: IV  Anesthesia Plan: General   Post-op Pain Management:    Induction: Intravenous  Airway Management Planned: Oral ETT  Additional Equipment: Arterial line, CVP, PA Cath, TEE, 3D TEE and Ultrasound Guidance Line Placement  Intra-op Plan:   Post-operative Plan: Post-operative intubation/ventilation  Informed Consent: I have reviewed the patients History and Physical, chart, labs and discussed the procedure including the risks, benefits and alternatives for the proposed anesthesia with the patient or authorized representative who has indicated his/her understanding and acceptance.   Dental advisory given  Plan Discussed with: CRNA  Anesthesia Plan Comments:         Anesthesia Quick Evaluation

## 2015-04-29 NOTE — Progress Notes (Signed)
NIF-26 VC 1.3L

## 2015-04-29 NOTE — H&P (Signed)
[address]       [place] [address]             [phone]      Cardiothoracic Surgery History and Physical    PCP is Mathews Argyle, MD Referring Provider is Jerline Pain, MD  Chief Complaint  Patient presents with  . Coronary Artery Disease        HPI:  The patient is a 74 year old active gentleman with a history of hyperlipidemia, hypertension and coronary disease s/p PCI of the LAD in 1994. He was followed be Dr. Doreatha Lew until his retirement and did well until recently. He has gone to the [facility] 3 days per week and done vigorous workouts until a few weeks ago when he noted some tightness across his chest after riding an exercise bike for 25 min. He went back to the gym the next day and had the same symptom so he went to his a cardiologist in [place] and had a stress tiest that was positive with a defect in the mid to distal LAD territory and ST depression. Cardiac cath on 04/22/2015 showed a 60% highly calcified LM Stenosis and a 95% ostial and proximal LAD stenosis that was very calcified. The LCX had no significant stenosis itself and the RCA had mild non-obstructive disease. LVEF was 64%. He has only had symptoms with heavy exertion and has subsequently avoided that.  Past Medical History  Diagnosis Date  . Alcohol abuse, in remission   . Hyperlipidemia   . HTN (hypertension)   . Anxiety   . Normal nuclear stress test     04/2012 [facility]  . Depression   . Complication of anesthesia     allergic reactions to Barbituates  . CAD (coronary artery disease)     Prior PCI to LAD in 1994 sees Dr Etter Sjogren  . Kidney stones     hx of   . GERD (gastroesophageal reflux disease)   . H/O hiatal hernia   . Swelling of both ankles     "if I'm on my feet alot or flying to [place] to see [place]" (05/01/2012)  . Lower GI bleeding     "had colonoscopy; it was internal  hemorrhoids"   . Arthritis     "hands, wrists, ankle on right" (05/01/2012)  . Hypocholesterolemia   . Left knee DJD   . Hiatal hernia   . H/O macrocytic anemia 10/2011    , Mild, Hemoglobin 12.5  . Vitamin B 12 deficiency 10/2011    , Start oral replacement  . Iron deficiency   . SIADH (syndrome of inappropriate ADH production) [facility])     Past Surgical History  Procedure Laterality Date  . Replacement total knee  08/2008; 04/30/2012    right; left  . US echocardiography  09/04/2005    EF 55-60%  . Joint replacement    . Cardiovascular stress test    . Eye surgery      bilateral cataracts removed 2013  . Tonsillectomy  1946  . Minor hemorrhoidectomy  ~ 1960  . Knee arthroscopy  ~ 2001    left  . Cataract extraction w/ intraocular lens implant, bilateral  ~ 2010; 03/2012    right; left  . Cardiac catheterization  01/30/1993    EF 55-60%  . Coronary angioplasty  1990's  . Total knee arthroplasty  04/30/2012    Procedure: TOTAL KNEE ARTHROPLASTY; Surgeon: Ninetta Lights, MD; Location: [place]; Service:  Orthopedics; Laterality: Left; left total knee arthroplasty  . Cardiac catheterization N/A 04/22/2015    Procedure: Left Heart Cath and Coronary Angiography; Surgeon: Jerline Pain, MD; Location: [place] [facility]; Service: Cardiovascular; Laterality: N/A;    Family History  Problem Relation Age of Onset  . Hypertension    . Depression    . Breast cancer Mother   . Heart disease Father     Social History Social History  Substance Use Topics  . Smoking status: Former Smoker -- 2.00 packs/day for 30 years    Types: Cigarettes    Quit date: 02/28/1995  . Smokeless tobacco: Never Used  . Alcohol Use: No     Comment: 05/01/2012 "totally quit alcohol in ~ 10 months; I was drinking too much beer so I quit"     Current Outpatient Prescriptions  Medication Sig Dispense Refill  . amLODipine (NORVASC) 5 MG tablet Take 1 tablet (5 mg total) by mouth daily. for blood pressure 30 tablet 3  . aspirin 81 MG tablet Take 81 mg by mouth daily.    Marland Kitchen atorvastatin (LIPITOR) 40 MG tablet Take 1 tablet (40 mg total) by mouth daily. 30 tablet 6  . diphenhydrAMINE (BENADRYL) 25 mg capsule Take 25 mg by mouth at bedtime as needed.    . metoprolol tartrate (LOPRESSOR) 25 MG tablet Take 0.5 tablets (12.5 mg total) by mouth 2 (two) times daily. For control of high blood pressure (Patient taking differently: Take 12.5 mg by mouth daily. For control of high blood pressure) 30 tablet 0  . nitroGLYCERIN (NITROSTAT) 0.4 MG SL tablet      No current facility-administered medications for this visit.    Allergies  Allergen Reactions  . Ativan [Lorazepam] Other (See Comments)    ATAXIA LASTING FOR SEVERAL DAYS  . Barbiturates Swelling and Other (See Comments)    "skin on head of penis came off; raw meat"  . Bee Venom Anaphylaxis    Patient carries an epi pen  . Penicillins Anaphylaxis  . Morphine And Related Itching  . Zetia [Ezetimibe]   . Statins Anxiety    Review of Systems  Constitutional: Positive for activity change. Negative for fatigue.  HENT: Negative.  Respiratory: Negative.  Cardiovascular: Positive for chest pain. Negative for palpitations and leg swelling.  Gastrointestinal: Negative.  Endocrine: Negative.  Genitourinary: Negative.  Musculoskeletal: Negative.  Skin: Negative.  Allergic/Immunologic: Negative.  Neurological: Negative.  Hematological: Negative.  Psychiatric/Behavioral: Negative.    BP 140/77 mmHg  Pulse 64  Resp 20  Ht 5\' 9"  (1.753 m)  Wt 192 lb (87.091 kg)  BMI 28.34 kg/m2  SpO2 96% Physical Exam  Constitutional: He is oriented to person, place, and time. He appears well-developed and  well-nourished. He appears distressed.  HENT:  Head: Normocephalic and atraumatic.  Mouth/Throat: Oropharynx is clear and moist.  Eyes: EOM are normal. Pupils are equal, round, and reactive to light.  Neck: Normal range of motion. Neck supple. No tracheal deviation present. No thyromegaly present.  Cardiovascular: Normal rate, regular rhythm, normal heart sounds and intact distal pulses.  No murmur heard. Pulmonary/Chest: Effort normal and breath sounds normal. No respiratory distress.  Abdominal: Soft. Bowel sounds are normal. He exhibits no distension and no mass. There is no tenderness.  Musculoskeletal: Normal range of motion. He exhibits no edema or tenderness.  Lymphadenopathy:   He has no cervical adenopathy.  Neurological: He is alert and oriented to person, place, and time. He has normal reflexes.  Skin: Skin is  warm and dry.  Psychiatric: He has a normal mood and affect.     Diagnostic Tests:  Procedures    Left Heart Cath and Coronary Angiography    Conclusion     Severe coronary artery disease-60% highly calcified left main, 95% ostial/proximal LAD. RCA with mild non-hemodynamic luminal irregularities.  Low normal ejection fraction of 50-55%. No obvious wall motion abnormalities (nuclear stress test EF 64%)  Referral to cardiothoracic surgery for bypass. Lesions high risk for PCI. He is not having any unstable symptoms, no chest pain currently. He will see cardiothoracic surgery as an outpatient in the next few days. If he does have any chest discomfort, he knows to proceed to the emergency department. Continue with aggressive medical management.     Technique and Indications    Radial artery access was obtained. After 2 unsuccessful attempts with the modified Seldinger technique, the third attempt more proximally was successful. 3 mg of verapamil and 4500 units of heparin were utilized. A Judkins right #4 catheter was utilized to selectively  cannulate the right coronary artery. Injections were made. The Judkins left 3.5 catheter did not adequately reach the left main artery therefore a Judkins left 4 catheter was then used. There was a significant amount of calcium around the left main and the catheter was able to get close to the ostium but not subselect and adequate views were obtained with hand injection. There was no dampening of hemodynamics. Left ventriculogram was used with Judkins right catheter. Following the procedure, he had a small hematoma at his wrist site. Hemostasis was obtained. Terumo T band was used.Estimated blood loss <50 mL.    Coronary Findings    Dominance: Right   Left Main  Severe calcification of left main after seeing into the proximal aorta is well. The there is 60% ostial left main disease with a shelf of calcium surrounding. The left main branches into the LAD and circumflex artery. Catheter was nonselective.     Left Anterior Descending  There is severe calcification of the proximal to mid LAD with ostial/proximal calcification and stenosis of up to 95% the LAD then continues to wrap around the apex and appears to have a good target for left internal mammary artery. There is also a previously placed stent in the what appears to be diagonal branch, difficult to clarify.     Left Circumflex  Circumflex vessels overall are widely patent, obtuse marginal branch present.     Right Coronary Artery  20-30% proximal lesion as well as 30% mid RCA lesion, PDA vessel patent. Dominant vessel.      Wall Motion    Low normal 50-55%. Nuclear ejection fraction was 64%. No obvious wall motion abnormalities.          Left Heart    Left Ventricle The left ventricular size is normal. The left ventricular systolic function is normal. There are no wall motion abnormalities in the left ventricle.   Mitral Valve There is no mitral valve regurgitation.   Aortic  Valve There is no aortic valve stenosis.    Implants    Name ID Temporary Type Supply   No information to display    PACS Images    Show images for Cardiac catheterization     Link to Procedure Log    Procedure Log      Hemo Data       Most Recent Value   AO Systolic Pressure  850 mmHg   AO Diastolic Pressure  56 mmHg  AO Mean  81 mmHg   LV Systolic Pressure  157 mmHg   LV Diastolic Pressure  2 mmHg   LV EDP  7 mmHg   Arterial Occlusion Pressure Extended Systolic Pressure  262 mmHg   Arterial Occlusion Pressure Extended Diastolic Pressure  53 mmHg   Arterial Occlusion Pressure Extended Mean Pressure  78 mmHg   Left Ventricular Apex Extended Systolic Pressure  035 mmHg   Left Ventricular Apex Extended Diastolic Pressure  2 mmHg   Left Ventricular Apex Extended EDP Pressure  9 mmHg    Order-Level Documents:    There are no order-level documents.    Encounter-Level Documents - 04/19/15:      Scan on 04/22/2015 11:33 AM by Provider Default, MDScan on 04/22/2015 11:33 AM by Provider Default, MD    Signed    Electronically signed by Jerline Pain, MD on 04/22/15 at 1140 EDT    Impression:  I have personally reviewed his cardiac cath and stress test. He has severe calcification of the left main and proximal LAD with 60% ostial LM stenosis and a long ostial and proximal 95% LAD stenosis. He has had angina with heavy exertion and is a physically active gentleman so he has had to stop this which is really limiting his lifestyle. I agree that CABG is indicated to prevent ischemia, infarction and death as well as to improve his quality of life. I discussed the operative procedure with the patient and his wife including alternatives, benefits and risks; including but not limited to bleeding, blood transfusion, infection, stroke, myocardial infarction, graft failure,  heart block requiring a permanent pacemaker, organ dysfunction, and death. Evlyn Kanner understands and agrees to proceed.   Plan:  CABG  [name], MD [facility] [phone]

## 2015-04-29 NOTE — Progress Notes (Signed)
  Echocardiogram Echocardiogram Transesophageal has been performed.  Darlina Sicilian M 04/29/2015, 8:26 AM

## 2015-04-29 NOTE — Brief Op Note (Signed)
04/29/2015  10:29 AM      [address]       [place] [address]             [phone]     04/29/2015  10:30 AM  PATIENT:  Dennis York  74 y.o. male  PRE-OPERATIVE DIAGNOSIS:  CAD  POST-OPERATIVE DIAGNOSIS:  Coronary Artery Disease  PROCEDURE:  Procedure(s): CORONARY ARTERY BYPASS GRAFTING (CABG)X2 LIMA-LAD; SVG-OM TRANSESOPHAGEAL ECHOCARDIOGRAM (TEE) ENDOSCOPIC RIGHT THIGH GREATER SAPHENOUS VEIN HARVEST(EVH)  SURGEON:  Surgeon(s): [name], MD  PHYSICIAN ASSISTANT: WAYNE GOLD PA-C  ANESTHESIA:   general  PATIENT CONDITION:  ICU - intubated and hemodynamically stable.  PRE-OPERATIVE WEIGHT: 88.8kg  EBL/TRANSFUSION: SEE OR RECORDS  COMPLICATIONS: NO KNOWN

## 2015-04-29 NOTE — Procedures (Signed)
Extubation Procedure Note  Patient Details:   Name: Dennis York DOB: October 18, 1940 MRN: [id number]   Airway Documentation:  Airway 8 mm (Active)  Secured at (cm) 24 cm 04/29/2015  4:46 PM  Measured From Lips 04/29/2015  4:46 PM  Secured Location Right 04/29/2015  4:46 PM  Secured By Rana Snare Tape 04/29/2015  4:46 PM  Site Condition Dry 04/29/2015  4:46 PM    Evaluation  O2 sats: stable throughout Complications: No apparent complications Patient did tolerate procedure well. Bilateral Breath Sounds: Clear   Yes  Hope Pigeon, MA 04/29/2015, 6:09 PM

## 2015-04-29 NOTE — Progress Notes (Signed)
RT Note: Patient is awake and able to follow commands. He is breathing well over the set rate. Weaning of ventilator per SICU wean protocol was initiated and RN is at bedside. Patient is tolerating it well. Rt will continue to wean patient off of the vent as tolerated and RT will continue to monitor.

## 2015-04-30 ENCOUNTER — Inpatient Hospital Stay (HOSPITAL_COMMUNITY): Payer: Medicare Other

## 2015-04-30 LAB — GLUCOSE, CAPILLARY
GLUCOSE-CAPILLARY: 103 mg/dL — AB (ref 65–99)
GLUCOSE-CAPILLARY: 103 mg/dL — AB (ref 65–99)
GLUCOSE-CAPILLARY: 113 mg/dL — AB (ref 65–99)
GLUCOSE-CAPILLARY: 114 mg/dL — AB (ref 65–99)
GLUCOSE-CAPILLARY: 115 mg/dL — AB (ref 65–99)
GLUCOSE-CAPILLARY: 116 mg/dL — AB (ref 65–99)
GLUCOSE-CAPILLARY: 117 mg/dL — AB (ref 65–99)
GLUCOSE-CAPILLARY: 117 mg/dL — AB (ref 65–99)
GLUCOSE-CAPILLARY: 125 mg/dL — AB (ref 65–99)
Glucose-Capillary: 118 mg/dL — ABNORMAL HIGH (ref 65–99)
Glucose-Capillary: 124 mg/dL — ABNORMAL HIGH (ref 65–99)
Glucose-Capillary: 113 mg/dL — ABNORMAL HIGH (ref 65–99)
Glucose-Capillary: 106 mg/dL — ABNORMAL HIGH (ref 65–99)
Glucose-Capillary: 121 mg/dL — ABNORMAL HIGH (ref 65–99)
Glucose-Capillary: 119 mg/dL — ABNORMAL HIGH (ref 65–99)
Glucose-Capillary: 131 mg/dL — ABNORMAL HIGH (ref 65–99)
Glucose-Capillary: 118 mg/dL — ABNORMAL HIGH (ref 65–99)
Glucose-Capillary: 113 mg/dL — ABNORMAL HIGH (ref 65–99)
Glucose-Capillary: 112 mg/dL — ABNORMAL HIGH (ref 65–99)
Glucose-Capillary: 121 mg/dL — ABNORMAL HIGH (ref 65–99)
Glucose-Capillary: 112 mg/dL — ABNORMAL HIGH (ref 65–99)
Glucose-Capillary: 130 mg/dL — ABNORMAL HIGH (ref 65–99)
Glucose-Capillary: 111 mg/dL — ABNORMAL HIGH (ref 65–99)
Glucose-Capillary: 120 mg/dL — ABNORMAL HIGH (ref 65–99)
Glucose-Capillary: 117 mg/dL — ABNORMAL HIGH (ref 65–99)
Glucose-Capillary: 130 mg/dL — ABNORMAL HIGH (ref 65–99)

## 2015-04-30 LAB — BASIC METABOLIC PANEL
Anion gap: 7 (ref 5–15)
BUN: 12 mg/dL (ref 6–20)
CALCIUM: 7.9 mg/dL — AB (ref 8.9–10.3)
CHLORIDE: 105 mmol/L (ref 101–111)
CO2: 22 mmol/L (ref 22–32)
CREATININE: 0.96 mg/dL (ref 0.61–1.24)
Glucose, Bld: 116 mg/dL — ABNORMAL HIGH (ref 65–99)
Potassium: 4.4 mmol/L (ref 3.5–5.1)
SODIUM: 134 mmol/L — AB (ref 135–145)

## 2015-04-30 LAB — CBC
HCT: 30.2 % — ABNORMAL LOW (ref 39.0–52.0)
HCT: 29.7 % — ABNORMAL LOW (ref 39.0–52.0)
HEMOGLOBIN: 10.5 g/dL — AB (ref 13.0–17.0)
Hemoglobin: 10 g/dL — ABNORMAL LOW (ref 13.0–17.0)
MCH: 32.4 pg (ref 26.0–34.0)
MCH: 31.9 pg (ref 26.0–34.0)
MCHC: 34.8 g/dL (ref 30.0–36.0)
MCHC: 33.7 g/dL (ref 30.0–36.0)
MCV: 93.2 fL (ref 78.0–100.0)
MCV: 94.9 fL (ref 78.0–100.0)
PLATELETS: 138 10*3/uL — AB (ref 150–400)
PLATELETS: 129 10*3/uL — AB (ref 150–400)
RBC: 3.24 MIL/uL — ABNORMAL LOW (ref 4.22–5.81)
RBC: 3.13 MIL/uL — ABNORMAL LOW (ref 4.22–5.81)
RDW: 13.6 % (ref 11.5–15.5)
RDW: 13.6 % (ref 11.5–15.5)
WBC: 11.2 10*3/uL — ABNORMAL HIGH (ref 4.0–10.5)
WBC: 12.8 10*3/uL — AB (ref 4.0–10.5)

## 2015-04-30 LAB — POCT I-STAT, CHEM 8
BUN: 14 mg/dL (ref 6–20)
CALCIUM ION: 1.11 mmol/L — AB (ref 1.13–1.30)
CREATININE: 0.9 mg/dL (ref 0.61–1.24)
Chloride: 95 mmol/L — ABNORMAL LOW (ref 101–111)
Glucose, Bld: 136 mg/dL — ABNORMAL HIGH (ref 65–99)
HCT: 31 % — ABNORMAL LOW (ref 39.0–52.0)
HEMOGLOBIN: 10.5 g/dL — AB (ref 13.0–17.0)
Potassium: 4.1 mmol/L (ref 3.5–5.1)
SODIUM: 131 mmol/L — AB (ref 135–145)
TCO2: 22 mmol/L (ref 0–100)

## 2015-04-30 LAB — MAGNESIUM
MAGNESIUM: 2.1 mg/dL (ref 1.7–2.4)
Magnesium: 1.7 mg/dL (ref 1.7–2.4)

## 2015-04-30 LAB — CREATININE, SERUM
CREATININE: 1.06 mg/dL (ref 0.61–1.24)
GFR calc non Af Amer: >60 mL/min (ref >60)

## 2015-04-30 MED ORDER — INSULIN DETEMIR 100 UNIT/ML ~~LOC~~ SOLN
20.0000 [IU] | Freq: Two times a day (BID) | SUBCUTANEOUS | Status: DC
Start: 2015-04-30 — End: 2015-05-01
  Administered 2015-04-30 – 2015-05-01 (×3): 20 [IU] via SUBCUTANEOUS
  Filled 2015-04-30 (×4): qty 0.2

## 2015-04-30 MED ORDER — CHLORHEXIDINE GLUCONATE CLOTH 2 % EX PADS
6.0000 | MEDICATED_PAD | Freq: Every day | CUTANEOUS | Status: DC
  Administered 2015-05-01 – 2015-05-02 (×2): 6 via TOPICAL

## 2015-04-30 MED ORDER — MUPIROCIN 2 % EX OINT
1.0000 "application " | TOPICAL_OINTMENT | Freq: Two times a day (BID) | CUTANEOUS | Status: DC
  Administered 2015-04-30 – 2015-05-03 (×6): 1 via NASAL
  Filled 2015-04-30: qty 22

## 2015-04-30 MED ORDER — FENTANYL CITRATE (PF) 100 MCG/2ML IJ SOLN: 12.5000 ug | INTRAMUSCULAR | Status: DC | PRN

## 2015-04-30 MED ORDER — ALUM & MAG HYDROXIDE-SIMETH 200-200-20 MG/5ML PO SUSP
30.0000 mL | Freq: Four times a day (QID) | ORAL | Status: DC | PRN
  Administered 2015-04-30 – 2015-05-01 (×2): 30 mL via ORAL
  Filled 2015-04-30 (×2): qty 30

## 2015-04-30 MED ORDER — KETOROLAC TROMETHAMINE 15 MG/ML IJ SOLN
15.0000 mg | Freq: Four times a day (QID) | INTRAMUSCULAR | Status: AC
  Administered 2015-04-30 – 2015-05-01 (×4): 15 mg via INTRAVENOUS
  Filled 2015-04-30 (×4): qty 1

## 2015-04-30 MED ORDER — INSULIN ASPART 100 UNIT/ML ~~LOC~~ SOLN
0.0000 [IU] | SUBCUTANEOUS | Status: DC
  Administered 2015-04-30: 2 [IU] via SUBCUTANEOUS

## 2015-04-30 MED ORDER — FUROSEMIDE 10 MG/ML IJ SOLN
20.0000 mg | Freq: Once | INTRAMUSCULAR | Status: AC
  Administered 2015-04-30: 20 mg via INTRAVENOUS
  Filled 2015-04-30: qty 2

## 2015-04-30 NOTE — Progress Notes (Signed)
[address]       [place] [address]             [phone]        CARDIOTHORACIC SURGERY PROGRESS NOTE   R1 Day Post-Op Procedure(s) (LRB): CORONARY ARTERY BYPASS GRAFTING (CABG) x2 using left internal mammory artery and right saphenous leg vein harvested endoscopically (N/A) TRANSESOPHAGEAL ECHOCARDIOGRAM (TEE) (N/A)  Subjective: Looks good.  Mild soreness.  Breathing comfortably  Objective: Vital signs: BP Readings from Last 1 Encounters:  04/30/15 100/55   Pulse Readings from Last 1 Encounters:  04/30/15 74   Resp Readings from Last 1 Encounters:  04/30/15 16   Temp Readings from Last 1 Encounters:  04/30/15 99 F (37.2 C)     Hemodynamics: PAP: (16-37)/(1-16) 31/9 mmHg CO:  [3 L/min-5.5 L/min] 5.4 L/min CI:  [1.5 L/min/m2-2.7 L/min/m2] 2.6 L/min/m2  Physical Exam:  Rhythm:   sinus  Breath sounds: clear  Heart sounds:  RRR  Incisions:  Dressing dry, intact  Abdomen:  Soft, non-distended, non-tender  Extremities:  Warm, well-perfused  Chest tubes:  Low volume thin serosanguinous output, no air leak    Intake/Output from previous day: 10/28 0701 - 10/29 0700 In: 4177.4 [P.O.:480; I.V.:2337.4; Blood:530; NG/GT:30; IV HALPFXTKW:409] Out: 70 [Urine:2625; Emesis/NG output:100; Blood:1415; Chest Tube:350] Intake/Output this shift: Total I/O In: 549.5 [I.V.:549.5] Out: 150 [Urine:100; Chest Tube:50]  Lab Results:  CBC: Recent Labs  04/29/15 1745 04/29/15 1747 04/30/15 0315  WBC 11.4*  --  11.2*  HGB 11.0* 10.9* 10.5*  HCT 32.0* 32.0* 30.2*  PLT 146*  --  138*    BMET:  Recent Labs  04/28/15 1012  04/29/15 1747 04/30/15 0315  NA 136  < > 137 134*  K 4.3  < > 4.5 4.4  CL 105  < > 102 105  CO2 20*  --   --  22  GLUCOSE 101*  < > 116* 116*  BUN 9  < > 10 12  CREATININE 1.00  < > 0.80 0.96  CALCIUM 9.5  --   --  7.9*  < > = values in this interval not displayed.   PT/INR:   Recent Labs  04/29/15 1205  LABPROT  18.6*  INR 1.55*    CBG (last 3)   Recent Labs  04/30/15 0214 04/30/15 0316 04/30/15 0415  GLUCAP 113* 112* 121*    ABG    Component Value Date/Time   PHART 7.322* 04/29/2015 1854   PCO2ART 42.0 04/29/2015 1854   PO2ART 85.0 04/29/2015 1854   HCO3 21.8 04/29/2015 1854   TCO2 23 04/29/2015 1854   ACIDBASEDEF 4.0* 04/29/2015 1854   O2SAT 96.0 04/29/2015 1854    CXR: PORTABLE CHEST - 1 VIEW  COMPARISON: the previous day's study  FINDINGS: Patient has been extubated and the nasogastric tube removed. Left chest tube stable with no pneumothorax. Right IJ Swan-Ganz stable. Previous median sternotomy. Mediastinal drains remain in place. Subsegmental atelectasis in both lung bases left greater than right. No effusion. Heart size upper limits normal for technique. Widening of the right acromioclavicular space suggesting ligamentous injury.  IMPRESSION: 1. Interval extubation with subsegmental atelectasis in the lung bases.   Electronically Signed  By: Lucrezia Europe M.D.  On: 04/30/2015 08:36  Assessment/Plan: S/P Procedure(s) (LRB): CORONARY ARTERY BYPASS GRAFTING (CABG) x2 using left internal mammory artery and right saphenous leg vein harvested endoscopically (N/A) TRANSESOPHAGEAL ECHOCARDIOGRAM (TEE) (N/A)  Doing very well POD1 Maintaining NSR w/ stable hemodynamics off all  drips Expected post op acute blood loss anemia, mild, stable Expected post op volume excess, mild Expected post op atelectasis, mild Likely borderline type II diabetes mellitus, excellent glycemic control   Mobilize  Diuresis  D/C tubes and lines  Add levemir and transition off insulin drip   Rexene Alberts, MD 04/30/2015 11:48 AM

## 2015-04-30 NOTE — Progress Notes (Signed)
TCTS BRIEF SICU PROGRESS NOTE  1 Day Post-Op  S/P Procedure(s) (LRB): CORONARY ARTERY BYPASS GRAFTING (CABG) x2 using left internal mammory artery and right saphenous leg vein harvested endoscopically (N/A) TRANSESOPHAGEAL ECHOCARDIOGRAM (TEE) (N/A)   Stable day NSR w/ stable BP O2 sats 94% on RA Diuresing some  Plan: Continue routine care  Rexene Alberts, MD 04/30/2015 5:21 PM

## 2015-05-01 ENCOUNTER — Inpatient Hospital Stay (HOSPITAL_COMMUNITY): Payer: Medicare Other

## 2015-05-01 LAB — GLUCOSE, CAPILLARY
GLUCOSE-CAPILLARY: 119 mg/dL — AB (ref 65–99)
GLUCOSE-CAPILLARY: 80 mg/dL (ref 65–99)
GLUCOSE-CAPILLARY: 88 mg/dL (ref 65–99)
Glucose-Capillary: 89 mg/dL (ref 65–99)
Glucose-Capillary: 117 mg/dL — ABNORMAL HIGH (ref 65–99)
Glucose-Capillary: 116 mg/dL — ABNORMAL HIGH (ref 65–99)

## 2015-05-01 LAB — BASIC METABOLIC PANEL
ANION GAP: 9 (ref 5–15)
BUN: 12 mg/dL (ref 6–20)
CO2: 24 mmol/L (ref 22–32)
Calcium: 8 mg/dL — ABNORMAL LOW (ref 8.9–10.3)
Chloride: 97 mmol/L — ABNORMAL LOW (ref 101–111)
Creatinine, Ser: 0.91 mg/dL (ref 0.61–1.24)
Glucose, Bld: 85 mg/dL (ref 65–99)
POTASSIUM: 3.7 mmol/L (ref 3.5–5.1)
SODIUM: 130 mmol/L — AB (ref 135–145)

## 2015-05-01 LAB — CBC
HEMATOCRIT: 25.2 % — AB (ref 39.0–52.0)
HEMOGLOBIN: 8.8 g/dL — AB (ref 13.0–17.0)
MCH: 32.4 pg (ref 26.0–34.0)
MCHC: 34.9 g/dL (ref 30.0–36.0)
MCV: 92.6 fL (ref 78.0–100.0)
Platelets: 108 10*3/uL — ABNORMAL LOW (ref 150–400)
RBC: 2.72 MIL/uL — ABNORMAL LOW (ref 4.22–5.81)
RDW: 13.5 % (ref 11.5–15.5)
WBC: 10.1 10*3/uL (ref 4.0–10.5)

## 2015-05-01 MED ORDER — MOVING RIGHT ALONG BOOK
Freq: Once | Status: AC
  Administered 2015-05-01: 1
  Filled 2015-05-01: qty 1

## 2015-05-01 MED ORDER — SODIUM CHLORIDE 0.9 % IV SOLN: 250.0000 mL | INTRAVENOUS | Status: DC | PRN

## 2015-05-01 MED ORDER — SODIUM CHLORIDE 0.9 % IJ SOLN: 3.0000 mL | INTRAMUSCULAR | Status: DC | PRN

## 2015-05-01 MED ORDER — FUROSEMIDE 40 MG PO TABS
40.0000 mg | ORAL_TABLET | Freq: Every day | ORAL | Status: DC
  Administered 2015-05-01: 40 mg via ORAL
  Filled 2015-05-01 (×3): qty 1

## 2015-05-01 MED ORDER — SODIUM CHLORIDE 0.9 % IJ SOLN
3.0000 mL | Freq: Two times a day (BID) | INTRAMUSCULAR | Status: DC
  Administered 2015-05-01 – 2015-05-03 (×5): 3 mL via INTRAVENOUS

## 2015-05-01 MED ORDER — INSULIN ASPART 100 UNIT/ML ~~LOC~~ SOLN: 0.0000 [IU] | Freq: Three times a day (TID) | SUBCUTANEOUS | Status: DC

## 2015-05-01 MED ORDER — POTASSIUM CHLORIDE 10 MEQ/50ML IV SOLN
10.0000 meq | INTRAVENOUS | Status: AC
  Administered 2015-05-01 (×3): 10 meq via INTRAVENOUS
  Filled 2015-05-01 (×3): qty 50

## 2015-05-01 MED ORDER — POTASSIUM CHLORIDE CRYS ER 20 MEQ PO TBCR
20.0000 meq | EXTENDED_RELEASE_TABLET | Freq: Every day | ORAL | Status: DC
  Administered 2015-05-01: 20 meq via ORAL
  Filled 2015-05-01 (×3): qty 1

## 2015-05-01 NOTE — Progress Notes (Signed)
[address]       [place] [address]             [phone]        CARDIOTHORACIC SURGERY PROGRESS NOTE   R2 Days Post-Op Procedure(s) (LRB): CORONARY ARTERY BYPASS GRAFTING (CABG) x2 using left internal mammory artery and right saphenous leg vein harvested endoscopically (N/A) TRANSESOPHAGEAL ECHOCARDIOGRAM (TEE) (N/A)  Subjective: Doing very well.  Minimal pain.  Ambulated around SICU  Objective: Vital signs: BP Readings from Last 1 Encounters:  05/01/15 129/56   Pulse Readings from Last 1 Encounters:  05/01/15 63   Resp Readings from Last 1 Encounters:  05/01/15 14   Temp Readings from Last 1 Encounters:  05/01/15 98.1 F (36.7 C) Oral    Hemodynamics: PAP: (26-29)/(2-10) 26/2 mmHg  Physical Exam:  Rhythm:   sinus  Breath sounds: clear  Heart sounds:  RRR  Incisions:  Clean and dry  Abdomen:  Soft, non-distended, non-tender  Extremities:  Warm, well-perfused    Intake/Output from previous day: 10/29 0701 - 10/30 0700 In: 2389 [P.O.:1560; I.V.:729; IV Piggyback:100] Out: 2240 [Urine:2090; Chest Tube:150] Intake/Output this shift: Total I/O In: 330 [P.O.:240; I.V.:40; IV Piggyback:50] Out: 800 [Urine:800]  Lab Results:  CBC: Recent Labs  04/30/15 1710 04/30/15 1716 05/01/15 0434  WBC 12.8*  --  10.1  HGB 10.0* 10.5* 8.8*  HCT 29.7* 31.0* 25.2*  PLT 129*  --  108*    BMET:  Recent Labs  04/30/15 0315  04/30/15 1716 05/01/15 0434  NA 134*  --  131* 130*  K 4.4  --  4.1 3.7  CL 105  --  95* 97*  CO2 22  --   --  24  GLUCOSE 116*  --  136* 85  BUN 12  --  14 12  CREATININE 0.96  < > 0.90 0.91  CALCIUM 7.9*  --   --  8.0*  < > = values in this interval not displayed.   PT/INR:   Recent Labs  04/29/15 1205  LABPROT 18.6*  INR 1.55*    CBG (last 3)   Recent Labs  05/01/15 0027 05/01/15 0410 05/01/15 0759  GLUCAP 119* 80 88    ABG    Component Value Date/Time   PHART 7.322* 04/29/2015 1854   PCO2ART 42.0 04/29/2015 1854   PO2ART 85.0 04/29/2015 1854   HCO3 21.8 04/29/2015 1854   TCO2 22 04/30/2015 1716   ACIDBASEDEF 4.0* 04/29/2015 1854   O2SAT 96.0 04/29/2015 1854    CXR: PORTABLE CHEST 1 VIEW  COMPARISON: 04/30/2015  FINDINGS: Sternotomy wires overlie normal cardiac silhouette. Interval retraction of the Swan-Ganz catheter. RIGHT IJ sheath remains. Interval removal of the mediastinal drains and LEFT chest tube. No pneumothorax. No pulmonary edema. No pleural fluid.  IMPRESSION: 1. Chest tube and mediastinal drain removal without complication. 2. No edema, pneumothorax, or significant atelectasis.   Electronically Signed  By: Suzy Bouchard M.D.  On: 05/01/2015 08:30  Assessment/Plan: S/P Procedure(s) (LRB): CORONARY ARTERY BYPASS GRAFTING (CABG) x2 using left internal mammory artery and right saphenous leg vein harvested endoscopically (N/A) TRANSESOPHAGEAL ECHOCARDIOGRAM (TEE) (N/A)  Doing very well POD2 Maintaining NSR w/ stable BP Expected post op acute blood loss anemia, mild, Hgb down slightly 8.8 Expected post op volume excess, mild, diuresing very well Expected post op atelectasis, mild Likely borderline type II diabetes mellitus, excellent glycemic control   Mobilize  Diuresis  D/C levemir and continue CBG's ac/hs  Transfer floor  Rexene Alberts, MD 05/01/2015 11:19 AM

## 2015-05-01 NOTE — Progress Notes (Signed)
K+= 3.7 and creat= 0.91 w/ urine o/p > 30cc/hr; TCTS KCL protocol initiated with 10 mEq KCL in 50cc IV x 3, each over one hour.

## 2015-05-02 ENCOUNTER — Encounter (HOSPITAL_COMMUNITY): Payer: Self-pay | Admitting: Surgery

## 2015-05-02 ENCOUNTER — Inpatient Hospital Stay (HOSPITAL_COMMUNITY): Payer: Medicare Other

## 2015-05-02 LAB — BASIC METABOLIC PANEL
ANION GAP: 5 (ref 5–15)
BUN: 15 mg/dL (ref 6–20)
CO2: 23 mmol/L (ref 22–32)
Calcium: 7.7 mg/dL — ABNORMAL LOW (ref 8.9–10.3)
Chloride: 106 mmol/L (ref 101–111)
Creatinine, Ser: 0.98 mg/dL (ref 0.61–1.24)
GFR calc Af Amer: >60 mL/min (ref >60)
GLUCOSE: 93 mg/dL (ref 65–99)
POTASSIUM: 4.1 mmol/L (ref 3.5–5.1)
Sodium: 134 mmol/L — ABNORMAL LOW (ref 135–145)

## 2015-05-02 LAB — CBC
HEMATOCRIT: 27.2 % — AB (ref 39.0–52.0)
Hemoglobin: 9.3 g/dL — ABNORMAL LOW (ref 13.0–17.0)
MCH: 32 pg (ref 26.0–34.0)
MCHC: 34.2 g/dL (ref 30.0–36.0)
MCV: 93.5 fL (ref 78.0–100.0)
Platelets: 139 10*3/uL — ABNORMAL LOW (ref 150–400)
RBC: 2.91 MIL/uL — AB (ref 4.22–5.81)
RDW: 13.5 % (ref 11.5–15.5)
WBC: 9.3 10*3/uL (ref 4.0–10.5)

## 2015-05-02 LAB — GLUCOSE, CAPILLARY: Glucose-Capillary: 94 mg/dL (ref 65–99)

## 2015-05-02 MED ORDER — LISINOPRIL 2.5 MG PO TABS
2.5000 mg | ORAL_TABLET | Freq: Every day | ORAL | Status: DC
  Administered 2015-05-02 – 2015-05-03 (×2): 2.5 mg via ORAL
  Filled 2015-05-02 (×2): qty 1

## 2015-05-02 MED FILL — Potassium Chloride Inj 2 mEq/ML: INTRAVENOUS | Qty: 40 | Status: AC

## 2015-05-02 MED FILL — Heparin Sodium (Porcine) Inj 1000 Unit/ML: INTRAMUSCULAR | Qty: 30 | Status: AC

## 2015-05-02 MED FILL — Magnesium Sulfate Inj 50%: INTRAMUSCULAR | Qty: 10 | Status: AC

## 2015-05-02 NOTE — Progress Notes (Signed)
CARDIAC REHAB PHASE I   PRE:  Rate/Rhythm: 70 SR  BP:  Supine: 124/70  Sitting:   Standing:    SaO2: 95%RA  MODE:  Ambulation: 500 ft   POST:  Rate/Rhythm: 74 SR  BP:  Supine:   Sitting: 130/70  Standing:    SaO2: 97%RA 1340-1420 Pt walked 500 ft independently with steady gait. Tolerated well. To bed after walk. Discussed CRP 2 and pt requested to be referred to [facility].  Pt did not need walker or assistance when up. Re enforced sternal precautions.   Graylon Good, RN BSN  05/02/2015 2:17 PM

## 2015-05-02 NOTE — Plan of Care (Signed)
Problem: Phase II - Intermediate Post-Op Goal: CBGs/Blood Glucose per SCIP Criteria Outcome: Completed/Met Date Met:  05/02/15 Transitioned off insulin drip with Levemir and Q4hr SSI. Maintaining adequate CBG's after advancement of diet and D/Cing Levemir. Goal: Pain controlled with appropriate interventions Outcome: Completed/Met Date Met:  05/02/15 Pt requiring no PRN analgesia, staying pain free with scheduled Tylenol and Toradol course. Goal: Advance Diet Outcome: Completed/Met Date Met:  05/02/15 Good PO intake after advancing to heart healthy diet. Goal: Activity Progressed Outcome: Completed/Met Date Met:  05/02/15 Pt able to ambulate 300-600' without difficulty 3x's per day.

## 2015-05-02 NOTE — Anesthesia Postprocedure Evaluation (Signed)
Anesthesia Post Note  Patient: Dennis York  Procedure(s) Performed: Procedure(s) (LRB): CORONARY ARTERY BYPASS GRAFTING (CABG) x2 using left internal mammory artery and right saphenous leg vein harvested endoscopically (N/A) TRANSESOPHAGEAL ECHOCARDIOGRAM (TEE) (N/A)  Anesthesia type: General  Patient location: SICU  Post pain: Pain level controlled  Post assessment: Post-op Vital signs reviewed  Last Vitals: BP 108/50 mmHg  Pulse 59  Temp(Src) 37.1 C (Oral)  Resp 14  Ht 5\' 9"  (1.753 m)  Wt 195 lb 15.8 oz (88.9 kg)  BMI 28.93 kg/m2  SpO2 96%  Post vital signs: Reviewed  Level of consciousness: sedated/intubated  Complications: No apparent anesthesia complications

## 2015-05-02 NOTE — Care Management Important Message (Signed)
Important Message  Patient Details  Name: Dennis York MRN: [id number] Date of Birth: 04-17-1941   Medicare Important Message Given:  Yes-second notification given    Nathen May 05/02/2015, 5:24 PM

## 2015-05-02 NOTE — Progress Notes (Signed)
Utilization review completed.  

## 2015-05-02 NOTE — Discharge Instructions (Signed)
Endoscopic Saphenous Vein Harvesting, Care After Refer to this sheet in the next few weeks. These instructions provide you with information on caring for yourself after your procedure. Your health care provider may also give you more specific instructions. Your treatment has been planned according to current medical practices, but problems sometimes occur. Call your health care provider if you have any problems or questions after your procedure. HOME CARE INSTRUCTIONS Medicine  Take whatever pain medicine your surgeon prescribes. Follow the directions carefully. Do not take over-the-counter pain medicine unless your surgeon says it is okay. Some pain medicine can cause bleeding problems for several weeks after surgery.  Follow your surgeon's instructions about driving. You will probably not be permitted to drive after heart surgery.  Take any medicines your surgeon prescribes. Any medicines you took before your heart surgery should be checked with your health care provider before you start taking them again. Wound care  If your surgeon has prescribed an elastic bandage or stocking, ask how long you should wear it.  Check the area around your surgical cuts (incisions) whenever your bandages (dressings) are changed. Look for any redness or swelling.  You will need to return to have the stitches (sutures) or staples taken out. Ask your surgeon when to do that.  Ask your surgeon when you can shower or bathe. Activity  Try to keep your legs raised when you are sitting.  Do any exercises your health care providers have given you. These may include deep breathing exercises, coughing, walking, or other exercises. SEEK MEDICAL CARE IF:  You have any questions about your medicines.  You have more leg pain, especially if your pain medicine stops working.  New or growing bruises develop on your leg.  Your leg swells, feels tight, or becomes red.  You have numbness in your leg. SEEK IMMEDIATE  MEDICAL CARE IF:  Your pain gets much worse.  Blood or fluid leaks from any of the incisions.  Your incisions become warm, swollen, or red.  You have chest pain.  You have trouble breathing.  You have a fever.  You have more pain near your leg incision. MAKE SURE YOU:  Understand these instructions.  Will watch your condition.  Will get help right away if you are not doing well or get worse.   This information is not intended to replace advice given to you by your health care provider. Make sure you discuss any questions you have with your health care provider.   Document Released: 02/28/2011 Document Revised: 07/09/2014 Document Reviewed: 02/28/2011 Elsevier Interactive Patient Education 2016 Else[employer] Coronary Artery Bypass Grafting, Care After Refer to this sheet in the next few weeks. These instructions provide you with information on caring for yourself after your procedure. Your health care provider may also give you more specific instructions. Your treatment has been planned according to current medical practices, but problems sometimes occur. Call your health care provider if you have any problems or questions after your procedure. WHAT TO EXPECT AFTER THE PROCEDURE Recovery from surgery will be different for everyone. Some people feel well after 3 or 4 weeks, while for others it takes longer. After your procedure, it is typical to have the following:  Nausea and a lack of appetite.   Constipation.  Weakness and fatigue.   Depression or irritability.   Pain or discomfort at your incision site. HOME CARE INSTRUCTIONS  Take medicines only as directed by your health care provider. Do not stop taking medicines or start any  new medicines without first checking with your health care provider.  Take your pulse as directed by your health care provider.  Perform deep breathing as directed by your health care provider. If you were given a device called an  incentive spirometer, use it to practice deep breathing several times a day. Support your chest with a pillow or your arms when you take deep breaths or cough.  Keep incision areas clean, dry, and protected. Remove or change any bandages (dressings) only as directed by your health care provider. You may have skin adhesive strips over the incision areas. Do not take the strips off. They will fall off on their own.  Check incision areas daily for any swelling, redness, or drainage.  If incisions were made in your legs, do the following:  Avoid crossing your legs.   Avoid sitting for long periods of time. Change positions every 30 minutes.   Elevate your legs when you are sitting.  Wear compression stockings as directed by your health care provider. These stockings help keep blood clots from forming in your legs.  Take showers once your health care provider approves. Until then, only take sponge baths. Pat incisions dry. Do not rub incisions with a washcloth or towel. Do not take baths, swim, or use a hot tub until your health care provider approves.  Eat foods that are high in fiber, such as raw fruits and vegetables, whole grains, beans, and nuts. Meats should be lean cut. Avoid canned, processed, and fried foods.  Drink enough fluid to keep your urine clear or pale yellow.  Weigh yourself every day. This helps identify if you are retaining fluid that may make your heart and lungs work harder.  Rest and limit activity as directed by your health care provider. You may be instructed to:  Stop any activity at once if you have chest pain, shortness of breath, irregular heartbeats, or dizziness. Get help right away if you have any of these symptoms.  Move around frequently for short periods or take short walks as directed by your health care provider. Increase your activities gradually. You may need physical therapy or cardiac rehabilitation to help strengthen your muscles and build your  endurance.  Avoid lifting, pushing, or pulling anything heavier than 10 lb (4.5 kg) for at least 6 weeks after surgery.  Do not drive until your health care provider approves.  Ask your health care provider when you may return to work.  Ask your health care provider when you may resume sexual activity.  Keep all follow-up visits as directed by your health care provider. This is important. SEEK MEDICAL CARE IF:  You have swelling, redness, increasing pain, or drainage at the site of an incision.  You have a fever.  You have swelling in your ankles or legs.  You have pain in your legs.   You gain 2 or more pounds (0.9 kg) a day.  You are nauseous or vomit.  You have diarrhea. SEEK IMMEDIATE MEDICAL CARE IF:  You have chest pain that goes to your jaw or arms.  You have shortness of breath.   You have a fast or irregular heartbeat.   You notice a "clicking" in your breastbone (sternum) when you move.   You have numbness or weakness in your arms or legs.  You feel dizzy or light-headed.  MAKE SURE YOU:  Understand these instructions.  Will watch your condition.  Will get help right away if you are not doing well or get  worse.   This information is not intended to replace advice given to you by your health care provider. Make sure you discuss any questions you have with your health care provider.   Document Released: 01/05/2005 Document Revised: 07/09/2014 Document Reviewed: 11/25/2012 Elsevier Interactive Patient Education [employer].

## 2015-05-02 NOTE — Care Management Note (Signed)
Case Management Note Marvetta Gibbons RN, BSN Unit 2W-Case Manager [phone]  Patient Details  Name: Dennis York MRN: [id number] Date of Birth: 1941/02/01  Subjective/Objective:  Pt admitted s/p CABG x2                  Action/Plan: PTA pt lived at home with spouse- plan to return home - no anticipated needs  Expected Discharge Date:    05/03/15            Expected Discharge Plan:  Home/Self Care  In-House Referral:     Discharge planning Services  CM Consult  Post Acute Care Choice:    Choice offered to:     DME Arranged:    DME Agency:     HH Arranged:    [place] Agency:     Status of Service:  In process, will continue to follow  Medicare Important Message Given:    Date Medicare IM Given:    Medicare IM give by:    Date Additional Medicare IM Given:    Additional Medicare Important Message give by:     If discussed at [place] of Stay Meetings, dates discussed:    Additional Comments:  Dawayne Patricia, RN 05/02/2015, 11:02 AM

## 2015-05-02 NOTE — Discharge Summary (Signed)
Physician Discharge Summary  Patient ID: Dennis York MRN: [id number] DOB/AGE: 1940/07/07 74 y.o.  Admit date: 04/29/2015 Discharge date: 05/02/2015  Admission Diagnoses: Severe coronary artery disease  Discharge Diagnoses:  Active Problems:   S/P CABG x 2  Patient Active Problem List   Diagnosis Date Noted  . S/P CABG x 2 04/29/2015  . Abnormal stress test   . Coronary artery disease due to lipid rich plaque 04/27/2014  . Palpitation 04/27/2014  . Essential hypertension 04/27/2014  . Achilles tendonitis 07/16/2012  . Bilateral hand pain 07/16/2012  . DJD (degenerative joint disease), ankle and foot 07/16/2012  . Acute gouty arthritis 09/04/2011  . Hypokalemia 09/02/2011  . Generalized weakness 09/02/2011  . Hypomagnesemia 09/02/2011  . Chronic alcoholism ([place]) 08/25/2011  . Anxiety and depression 08/25/2011  . CAD (coronary artery disease) 02/28/2011  . Hyperlipidemia 02/28/2011  . HTN (hypertension) 02/28/2011   HPI at time of consultation   The patient is a 74 year old active gentleman with a history of hyperlipidemia, hypertension and coronary disease s/p PCI of the LAD in 1994. He was followed be Dr. Doreatha Lew until his retirement and did well until recently. He has gone to the [facility] 3 days per week and done vigorous workouts until a few weeks ago when he noted some tightness across his chest after riding an exercise bike for 25 min. He went back to the gym the next day and had the same symptom so he went to his a cardiologist in [place] and had a stress tiest that was positive with a defect in the mid to distal LAD territory and ST depression. Cardiac cath on 04/22/2015 showed a 60% highly calcified LM Stenosis and a 95% ostial and proximal LAD stenosis that was very calcified. The LCX had no significant stenosis itself and the RCA had mild non-obstructive disease. LVEF was 64%. He has only had symptoms with heavy exertion and has subsequently avoided that.  The patient  was admitted electively for the procedure.   Discharged Condition: good  Hospital Course: The patient was admitted electively and taken to the operating room where he underwent the procedure described below. He tolerated it well and was taken to the surgical intensive care unit in stable condition postoperatively has progressed quite well. He was weaned from the ventilator without difficulty using standard protocols. All routine lines, monitors and drainage devices have been discontinued in the standard fashion. He has remained hemodynamically stable in sinus rhythm. He has a mild expected acute blood loss anemia which is stable. He has expected postoperative volume excess which is also mild and responded to diuretics. Blood sugars have been under excellent control. Oxygen has been weaned and he maintains good saturations on room air. Incisions are noted to be healing well without evidence of infection. He is tolerating diet. His overall status is felt to be tentatively stable for discharge in the next 24 hours pending ongoing reevaluation of his recovery.  Consults: None  Significant Diagnostic Studies: routine post op labs/CXR  Treatments: surgery:    CARDIOVASCULAR SURGERY OPERATIVE NOTE  04/29/2015  Surgeon: [name], MD  First Assistant: Jadene Pierini, PA-C   Preoperative Diagnosis: Severe left main and LAD coronary artery disease   Postoperative Diagnosis: Same   Procedure:  1. Median Sternotomy 2. Extracorporeal circulation 3. Coronary artery bypass grafting x 2   Left internal mammary graft to the LAD  SVG to OM   4. Endoscopic vein harvest from the right leg   Anesthesia: General Endotracheal  Discharge Exam: Blood pressure 117/57, pulse 70, temperature 98.8 F (37.1 C), temperature source Oral, resp. rate 18, height 5\' 9"  (1.753 m), weight 195 lb 15.8 oz  (88.9 kg), SpO2 96 %.   General appearance: alert, cooperative and no distress Heart: regular rate and rhythm Lungs: clear to auscultation bilaterally Abdomen: benign Extremities: no edema Wound: incis healing well  Disposition: 01-Home or Self Care   medications at time of discharge:   Medication List    STOP taking these medications        amLODipine 5 MG tablet  Commonly known as:  NORVASC     aspirin 81 MG tablet  Replaced by:  aspirin 325 MG EC tablet     nitroGLYCERIN 0.4 MG SL tablet  Commonly known as:  NITROSTAT      TAKE these medications        aspirin 325 MG EC tablet  Take 1 tablet (325 mg total) by mouth daily.     atorvastatin 40 MG tablet  Commonly known as:  LIPITOR  Take 1 tablet (40 mg total) by mouth daily.     diphenhydrAMINE 25 mg capsule  Commonly known as:  BENADRYL  Take 12.5-25 mg by mouth at bedtime as needed.     lisinopril 2.5 MG tablet  Commonly known as:  PRINIVIL,ZESTRIL  Take 1 tablet (2.5 mg total) by mouth daily.     metoprolol tartrate 25 MG tablet  Commonly known as:  LOPRESSOR  Take 0.5 tablets (12.5 mg total) by mouth 2 (two) times daily. For control of high blood pressure            Follow-up Information    Follow up with [name], MD.   Specialty:  Cardiothoracic Surgery   Why:  Appointment to see the surgeon on 06/01/2015 at 10:30 AM. Please obtain a chest x-ray [facility] is located in the same office complex.   Contact information:   [address] [place]  [place] [address] [phone]       Follow up with [facility] [place].   Specialty:  Cardiothoracic Surgery   Why:  Nurse appointment on 05/09/2015 at 10:30 AM for suture removal.   Contact information:   [place], [place] [phone]     The patient has been discharged on:   1.Beta Blocker:  Yes Blue.Reese   ]                               No   [   ]                              If No, reason:  2.Ace Inhibitor/ARB: Yes [ y  ]                                     No  [    ]                                     If No, reason:  3.Statin:   Yes Blue.Reese   ]  No  [   ]                  If No, reason:  4.Ecasa:  Yes  [ y  ]                  No   [   ]                  If No, reason:  Signed: GOLD,WAYNE E 05/02/2015, 2:06 PM

## 2015-05-02 NOTE — Progress Notes (Signed)
3 Days Post-Op Procedure(s) (LRB): CORONARY ARTERY BYPASS GRAFTING (CABG) x2 using left internal mammory artery and right saphenous leg vein harvested endoscopically (N/A) TRANSESOPHAGEAL ECHOCARDIOGRAM (TEE) (N/A) Subjective: No complaints. Bowels working 2500 on IS Ambulating well  Objective: Vital signs in last 24 hours: Temp:  [98.4 F (36.9 C)-100 F (37.8 C)] 98.8 F (37.1 C) (10/31 0700) Pulse Rate:  [59-78] 69 (10/31 0800) Cardiac Rhythm:  [-] Normal sinus rhythm (10/31 0800) Resp:  [12-25] 17 (10/31 0800) BP: (105-129)/(44-62) 118/60 mmHg (10/31 0800) SpO2:  [94 %-100 %] 96 % (10/31 0800) Weight:  [88.9 kg (195 lb 15.8 oz)] 88.9 kg (195 lb 15.8 oz) (10/31 0400)  Hemodynamic parameters for last 24 hours:    Intake/Output from previous day: 10/30 0701 - 10/31 0700 In: 2620 [P.O.:2520; I.V.:50; IV Piggyback:50] Out: 3329 [Urine:4425] Intake/Output this shift:    General appearance: alert and cooperative Neurologic: intact Heart: regular rate and rhythm, S1, S2 normal, no murmur, click, rub or gallop Lungs: clear to auscultation bilaterally Wound: incision ok  Lab Results:  Recent Labs  05/01/15 0434 05/02/15 0237  WBC 10.1 9.3  HGB 8.8* 9.3*  HCT 25.2* 27.2*  PLT 108* 139*   BMET:  Recent Labs  05/01/15 0434 05/02/15 0237  NA 130* 134*  K 3.7 4.1  CL 97* 106  CO2 24 23  GLUCOSE 85 93  BUN 12 15  CREATININE 0.91 0.98  CALCIUM 8.0* 7.7*    PT/INR:  Recent Labs  04/29/15 1205  LABPROT 18.6*  INR 1.55*   ABG    Component Value Date/Time   PHART 7.322* 04/29/2015 1854   HCO3 21.8 04/29/2015 1854   TCO2 22 04/30/2015 1716   ACIDBASEDEF 4.0* 04/29/2015 1854   O2SAT 96.0 04/29/2015 1854   CBG (last 3)   Recent Labs  05/01/15 1610 05/01/15 2209 05/02/15 0745  GLUCAP 117* 116* 94   CXR: clear  Assessment/Plan: S/P Procedure(s) (LRB): CORONARY ARTERY BYPASS GRAFTING (CABG) x2 using left internal mammery artery and right saphenous  leg vein harvested endoscopically (N/A) TRANSESOPHAGEAL ECHOCARDIOGRAM (TEE) (N/A)  Hemodynamically stable in sinus rhythm  Wt is at preop. Stop diuretic.  Glucose under good control and preop Hgb A1c 5.8 so will stop CBG's and SSI.  He has bed on 2W and if no changes he can go home tomorrow.   LOS: 3 days    Dennis York 05/02/2015

## 2015-05-03 MED ORDER — LISINOPRIL 2.5 MG PO TABS: 2.5000 mg | ORAL_TABLET | Freq: Every day | ORAL | Status: DC

## 2015-05-03 MED ORDER — ASPIRIN 325 MG PO TBEC: 325.0000 mg | DELAYED_RELEASE_TABLET | Freq: Every day | ORAL | Status: DC

## 2015-05-03 NOTE — Progress Notes (Signed)
[id number] Education completed with pt who voiced understanding. Encouraged IS. Wrote down how to view post op video that pt will view when wife comes. Discussed heart healthy food choices and watching carbs. Referring to [place] Phase 2.  Graylon Good RN BSN 05/03/2015 11:19 AM

## 2015-05-03 NOTE — Progress Notes (Signed)
Discussed with the patient and his wife and all questioned fully answered. They will call me if any problems arise. Discharge teaching included medications, activity after CABG, and when to call the MD. Teach back utilized - Mr. Mangino and his wife both show great understanding.  Fritz Pickerel, RN

## 2015-05-03 NOTE — Progress Notes (Signed)
[address]       [place] [address]             [phone]      4 Days Post-Op Procedure(s) (LRB): CORONARY ARTERY BYPASS GRAFTING (CABG) x2 using left internal mammory artery and right saphenous leg vein harvested endoscopically (N/A) TRANSESOPHAGEAL ECHOCARDIOGRAM (TEE) (N/A) Subjective: Feels good   Objective: Vital signs in last 24 hours: Temp:  [98.5 F (36.9 C)-98.8 F (37.1 C)] 98.8 F (37.1 C) (11/01 0442) Pulse Rate:  [62-72] 62 (11/01 0442) Cardiac Rhythm:  [-] Normal sinus rhythm;Heart block (10/31 1900) Resp:  [13-22] 20 (11/01 0442) BP: (101-125)/(48-60) 125/57 mmHg (11/01 0442) SpO2:  [96 %-97 %] 97 % (11/01 0442) Weight:  [196 lb 13.9 oz (89.3 kg)] 196 lb 13.9 oz (89.3 kg) (11/01 0442)  Hemodynamic parameters for last 24 hours:    Intake/Output from previous day: 10/31 0701 - 11/01 0700 In: 1443 [P.O.:1440; I.V.:3] Out: 2925 [Urine:2925] Intake/Output this shift:    General appearance: alert, cooperative and no distress Heart: regular rate and rhythm Lungs: clear to auscultation bilaterally Abdomen: benign Extremities: no edema Wound: incis healing well  Lab Results:  Recent Labs  05/01/15 0434 05/02/15 0237  WBC 10.1 9.3  HGB 8.8* 9.3*  HCT 25.2* 27.2*  PLT 108* 139*   BMET:  Recent Labs  05/01/15 0434 05/02/15 0237  NA 130* 134*  K 3.7 4.1  CL 97* 106  CO2 24 23  GLUCOSE 85 93  BUN 12 15  CREATININE 0.91 0.98  CALCIUM 8.0* 7.7*    PT/INR: No results for input(s): LABPROT, INR in the last 72 hours. ABG    Component Value Date/Time   PHART 7.322* 04/29/2015 1854   HCO3 21.8 04/29/2015 1854   TCO2 22 04/30/2015 1716   ACIDBASEDEF 4.0* 04/29/2015 1854   O2SAT 96.0 04/29/2015 1854   CBG (last 3)   Recent Labs  05/01/15 1610 05/01/15 2209 05/02/15 0745  GLUCAP 117* 116* 94    Meds Scheduled Meds: . acetaminophen  1,000 mg Oral 4 times per day  . antiseptic oral rinse  7 mL Mouth Rinse BID    . aspirin EC  325 mg Oral Daily  . atorvastatin  40 mg Oral q1800  . bisacodyl  10 mg Oral Daily   Or  . bisacodyl  10 mg Rectal Daily  . Chlorhexidine Gluconate Cloth  6 each Topical Daily  . lisinopril  2.5 mg Oral Daily  . metoprolol tartrate  12.5 mg Oral BID  . mupirocin ointment  1 application Nasal BID  . pantoprazole  40 mg Oral Daily  . sodium chloride  3 mL Intravenous Q12H   Continuous Infusions: . sodium chloride Stopped (05/01/15 1300)   PRN Meds:.sodium chloride, alum & mag hydroxide-simeth, Influenza vac split quadrivalent PF, ondansetron (ZOFRAN) IV, sodium chloride  Xrays Dg Chest 2 View  05/02/2015  CLINICAL DATA:  CABG.  Atelectasis. EXAM: CHEST  2 VIEW COMPARISON:  05/01/2015. FINDINGS: Mediastinum and hilar structures are normal. Prior CABG. Heart size stable. Low lung volumes. Tiny left pleural effusion cannot be excluded. No pneumothorax. IMPRESSION: 1. Low lung volumes.  Tiny left pleural effusion cannot be excluded. 2. Prior CABG.  Heart size stable. Electronically Signed   By: Marcello Moores  Register   On: 05/02/2015 07:35    Assessment/Plan: S/P Procedure(s) (LRB): CORONARY ARTERY BYPASS GRAFTING (CABG) x2 using left internal mammory artery and right saphenous leg vein harvested endoscopically (N/A) TRANSESOPHAGEAL ECHOCARDIOGRAM (  TEE) (N/A) d/c pacing wires Plan for discharge: see discharge orders   LOS: 4 days    Normand Damron E 05/03/2015

## 2015-05-03 NOTE — Progress Notes (Signed)
Pt pacing wires pulled per order. Bed rest maintained for one hour. V/S stable. No bleeding at sites. Pt comfortable in chair with call bell within reach. Will continue to monitor.   Fritz Pickerel, RN

## 2015-05-09 ENCOUNTER — Ambulatory Visit (INDEPENDENT_AMBULATORY_CARE_PROVIDER_SITE_OTHER): Payer: Self-pay | Admitting: *Deleted

## 2015-05-09 DIAGNOSIS — Z951 Presence of aortocoronary bypass graft: Secondary | ICD-10-CM

## 2015-05-09 DIAGNOSIS — Z4802 Encounter for removal of sutures: Secondary | ICD-10-CM

## 2015-05-09 DIAGNOSIS — I25119 Atherosclerotic heart disease of native coronary artery with unspecified angina pectoris: Secondary | ICD-10-CM

## 2015-05-09 DIAGNOSIS — I2510 Atherosclerotic heart disease of native coronary artery without angina pectoris: Secondary | ICD-10-CM

## 2015-05-10 NOTE — Progress Notes (Signed)
Dennis York returns for suture removal of three previous chest tube sutures s/p CABG X 2.  These were easily removed are very well healed as well as the sternal incision and the right leg evh site.  He has several questions regarding activities which I was able to answer.  He wants to travel to his [place] home in [place] this week, a two hour drive and forty five minute drive from a town when he gets there. I cleared this with Dr. Cyndia Bent and informed him that per Dr. Cyndia Bent it was o.k. He will return as scheduled with a chest xray.

## 2015-05-20 ENCOUNTER — Encounter: Payer: Self-pay | Admitting: Physician Assistant

## 2015-05-20 ENCOUNTER — Telehealth: Payer: Self-pay | Admitting: Cardiology

## 2015-05-20 NOTE — Telephone Encounter (Signed)
Spoke with pt who is reporting in the past couple of days he has had some skipped beats on occasion.  Seems to occur more in the afternoon.  His HR is in the 60's most all the time.  He reports feeling "really good" and in fact has been in the gym for atleast 30 mins a day for the past 10 days on the bike.  [place].  He has no other symptoms.  Advised to decrease or stop any stimulates.  Call MD on call if he develops other s/s and to follow up as scheduled on Monday.

## 2015-05-20 NOTE — Telephone Encounter (Signed)
New message      Pt had bypass surgery 3wks ago.  He is having occasional extra skipped beats.  Please call pt.

## 2015-05-21 DIAGNOSIS — R9431 Abnormal electrocardiogram [ECG] [EKG]: Secondary | ICD-10-CM | POA: Diagnosis not present

## 2015-05-21 DIAGNOSIS — Z9861 Coronary angioplasty status: Secondary | ICD-10-CM | POA: Diagnosis not present

## 2015-05-21 DIAGNOSIS — R600 Localized edema: Secondary | ICD-10-CM | POA: Diagnosis not present

## 2015-05-21 DIAGNOSIS — R002 Palpitations: Secondary | ICD-10-CM | POA: Diagnosis not present

## 2015-05-21 DIAGNOSIS — I10 Essential (primary) hypertension: Secondary | ICD-10-CM | POA: Diagnosis not present

## 2015-05-21 DIAGNOSIS — I440 Atrioventricular block, first degree: Secondary | ICD-10-CM | POA: Diagnosis not present

## 2015-05-21 DIAGNOSIS — Z87891 Personal history of nicotine dependence: Secondary | ICD-10-CM | POA: Diagnosis not present

## 2015-05-21 DIAGNOSIS — Z951 Presence of aortocoronary bypass graft: Secondary | ICD-10-CM | POA: Diagnosis not present

## 2015-05-21 DIAGNOSIS — I493 Ventricular premature depolarization: Secondary | ICD-10-CM | POA: Diagnosis not present

## 2015-05-21 DIAGNOSIS — I2510 Atherosclerotic heart disease of native coronary artery without angina pectoris: Secondary | ICD-10-CM | POA: Diagnosis not present

## 2015-05-22 NOTE — Progress Notes (Signed)
Cardiology Office Note   Date:  05/23/2015   ID:  Dennis York, DOB 1941/01/19, MRN [id number]   Patient Care Team: Lajean Manes, MD as PCP - General (Internal Medicine) Jerline Pain, MD as Consulting Physician (Cardiology)    Chief Complaint  Patient presents with  . Hospitalization Follow-up    s/p CABG; ED visit in [place] 05/21/15  . Coronary Artery Disease     History of Present Illness: Dennis York is a 74 y.o. male with a hx of CAD status post prior stenting to the LAD in 1994, HTN, HL, gout. Prior patient of Dr. Doreatha Lew. Last seen by Dr. Marlou Porch 04/19/15. He had undergone stress testing in [place] secondary to exertional chest discomfort. This was abnormal and demonstrated ischemia in LAD territory. Cardiac catheterization was arranged. This demonstrated ostial left main and ostial LAD stenosis.  He was evaluated by Dr. Cyndia Bent.  He was admitted 10/28-10/31 and underwent CABG with L-LAD, S-OM.  Post op course was fairly uneventful.  He returns for FU.  Seen at [facility] ED in [place], [place] 05/21/15 with palpitations and HTN.  BP was reported to be 200/100 at home.  CEs remained neg.  ECG demonstrated NSR.    He returns for FU. He is overall doing very well.  Denies chest pain, dyspnea, syncope, orthopnea, PND.  No fevers or chills. No cough.  He did injure his R ankle several days ago.  He now has some edema in R leg. Notes some cramps in his R calf at times.     Studies/Reports Reviewed Today:  TEE 04/29/15 EF 50-55%  Carotid US 04/28/15 Bilateral ICA 1-39%  LHC 04/22/15 LM: Ostial 60% LAD: Ostial/proximal 95% LCx: Patent RCA: Proximal 20-30%, mid 30% EF:  50-55%  Myoview 04/07/15 [facility]) 1. Abnormal Myocardial perfusion study to moderate size mid to distal anterior apical reversible perfusion defect suggestive of ischemia in the LAD territory. Recommend cardiac catheterization. 2. Transient ischemic dilatation (TID) ratio was 0.89 is considered  within normal limits . 3. Left ventricular systolic function is normal with estimated LVEF of 64%.    Past Medical History  Diagnosis Date  . Alcohol abuse, in remission   . Hyperlipidemia   . HTN (hypertension)   . Anxiety   . Normal nuclear stress test     04/2012 [facility]  . Depression   . Complication of anesthesia     allergic reactions to Barbituates  . CAD (coronary artery disease)     Prior PCI to LAD in 1994 sees Dr Etter Sjogren  . Kidney stones     hx of   . GERD (gastroesophageal reflux disease)   . H/O hiatal hernia   . Swelling of both ankles     "if I'm on my feet alot or flying to overseas to see daughter" (05/01/2012)  . Lower GI bleeding     "had colonoscopy; it was internal  hemorrhoids"   . Arthritis     "hands, wrists, ankle on right" (05/01/2012)  . Hypocholesterolemia   . Left knee DJD   . Hiatal hernia   . H/O macrocytic anemia 10/2011    , Mild, Hemoglobin 12.5  . Vitamin B 12 deficiency 10/2011    , Start oral replacement  . Iron deficiency   . SIADH (syndrome of inappropriate ADH production) [facility])     Past Surgical History  Procedure Laterality Date  . Replacement total knee  08/2008; 04/30/2012    right; left  . US echocardiography  09/04/2005    EF 55-60%  . Joint replacement    . Cardiovascular stress test    . Eye surgery      bilateral cataracts removed 2013  . Tonsillectomy  1946  . Minor hemorrhoidectomy  ~ 1960  . Knee arthroscopy  ~ 2001    left  . Cataract extraction w/ intraocular lens  implant, bilateral  ~ 2010; 03/2012    right; left  . Cardiac catheterization  01/30/1993    EF 55-60%  . Coronary angioplasty  1990's  . Total knee arthroplasty  04/30/2012    Procedure: TOTAL KNEE ARTHROPLASTY;  Surgeon: Ninetta Lights, MD;  Location: [place];  Service: Orthopedics;  Laterality: Left;  left total knee arthroplasty  . Cardiac catheterization N/A 04/22/2015    Procedure: Left Heart Cath and Coronary Angiography;  Surgeon:  Jerline Pain, MD;  Location: [place] [facility];  Service: Cardiovascular;  Laterality: N/A;  . Coronary artery bypass graft N/A 04/29/2015    Procedure: CORONARY ARTERY BYPASS GRAFTING (CABG) x2 using left internal mammory artery and right saphenous leg vein harvested endoscopically;  Surgeon: [name], MD;  Location: [place] OR;  Service: Open Heart Surgery;  Laterality: N/A;  . Tee without cardioversion N/A 04/29/2015    Procedure: TRANSESOPHAGEAL ECHOCARDIOGRAM (TEE);  Surgeon: [name], MD;  Location: [place];  Service: Open Heart Surgery;  Laterality: N/A;     Current Outpatient Prescriptions  Medication Sig Dispense Refill  . aspirin EC 325 MG EC tablet Take 1 tablet (325 mg total) by mouth daily. 30 tablet 0  . atorvastatin (LIPITOR) 40 MG tablet Take 1 tablet (40 mg total) by mouth daily. 30 tablet 6  . diphenhydrAMINE (BENADRYL) 25 mg capsule Take 12.5-25 mg by mouth at bedtime as needed for allergies.     Marland Kitchen lisinopril (PRINIVIL,ZESTRIL) 2.5 MG tablet Take 1 tablet (2.5 mg total) by mouth daily. 30 tablet 1  . metoprolol tartrate (LOPRESSOR) 25 MG tablet Take 0.5 tablets (12.5 mg total) by mouth 2 (two) times daily. For control of high blood pressure (Patient taking differently: Take 12.5 mg by mouth daily. For control of high blood pressure) 30 tablet 0   No current facility-administered medications for this visit.    Allergies:   Ativan; Barbiturates; Bee venom; Penicillins; Morphine and related; Percocet; and Vicodin    Social History:   Social History   Social History  . Marital Status: Married    Spouse Name: N/A  . Number of Children: N/A  . Years of Education: N/A   Social History Main Topics  . Smoking status: Former Smoker -- 2.00 packs/day for 30 years    Types: Cigarettes    Quit date: 02/28/1995  . Smokeless tobacco: Never Used  . Alcohol Use: No     Comment: 05/01/2012 "totally quit alcohol in ~ 10 months; I was drinking too much beer so I quit"  .  Drug Use: No  . Sexual Activity: Yes   Other Topics Concern  . None   Social History Narrative     Family History:   Family History  Problem Relation Age of Onset  . Hypertension    . Depression    . Breast cancer Mother   . Heart disease Father       ROS:   Please see the history of present illness.   Review of Systems  All other systems reviewed and are negative.     PHYSICAL EXAM: VS:  BP 124/50  mmHg  Pulse 58  Ht 5\' 9"  (1.753 m)  Wt 193 lb 12.8 oz (87.907 kg)  BMI 28.61 kg/m2    Wt Readings from Last 3 Encounters:  05/23/15 193 lb 12.8 oz (87.907 kg)  05/03/15 196 lb 13.9 oz (89.3 kg)  04/28/15 195 lb 12.3 oz (88.8 kg)     GEN: Well nourished, well developed, in no acute distress HEENT: normal Neck: no JVD,   no masses Cardiac:  Normal S1/S2, RRR; no murmur ,  no rubs or gallops, trace-1+ RLE edema   Respiratory:  clear to auscultation bilaterally, no wheezing, rhonchi or rales. GI: soft, nontender, nondistended, + BS MS: no deformity or atrophy Skin: warm and dry  Neuro:  CNs II-XII intact, Strength and sensation are intact Psych: Normal affect   EKG:  EKG is ordered today.  It demonstrates:   Sinus brady, HR 58, normal axis, septal Q waves, TWI 2, 3, aVF, QTc 412 ms.    Recent Labs: 04/28/2015: ALT 23 04/30/2015: Magnesium 1.7 05/02/2015: BUN 15; Creatinine, Ser 0.98; Hemoglobin 9.3*; Platelets 139*; Potassium 4.1; Sodium 134*    Lipid Panel    Component Value Date/Time   CHOL 210* 10/26/2014 1558   TRIG 163.0* 10/26/2014 1558   HDL 38.40* 10/26/2014 1558   CHOLHDL 5 10/26/2014 1558   VLDL 32.6 10/26/2014 1558   LDLCALC 139* 10/26/2014 1558   LDLDIRECT 136.3 02/27/2011 0841      ASSESSMENT AND PLAN:  1. CAD:  S/p remote stenting to the LAD.  Now s/p CABG.  He is progressing very well.  Continue ASA, statin, beta-blocker.  Refer to Cardiac Rehab. FU with Dr. Cyndia Bent next week.   2. HTN:  Controlled.   3. Hyperlipidemia:  Continue  statin.  4. Palpitations:  Etiology not clear.  He was in NSR at the ED in [place]. No recurrence. He can take an extra 1/2 Metoprolol Tartrate if this recurs.  I would recommend an event monitor if his symptoms recur.   5. Edema:  Low suspicion for DVT. However, he has a recent hospital stay for open heart surgery and a recent leg injury. Will get RLE venous duplex to r/o DVT.       Medication Changes: Current medicines are reviewed at length with the patient today.  Concerns regarding medicines are as outlined above.  The following changes have been made:   Discontinued Medications   No medications on file   Modified Medications   No medications on file   New Prescriptions   No medications on file   Labs/ tests ordered today include:   Orders Placed This Encounter  Procedures  . EKG 12-Lead     Disposition:    FU with Dr. Candee Furbish 2-3 mos.     Signed, Versie Starks, MHS 05/23/2015 3:58 PM    [facility] [address], [place], [place]  [address] Phone: [phone]; Fax: [phone]

## 2015-05-23 ENCOUNTER — Ambulatory Visit (INDEPENDENT_AMBULATORY_CARE_PROVIDER_SITE_OTHER): Payer: Medicare Other | Admitting: Physician Assistant

## 2015-05-23 ENCOUNTER — Encounter: Payer: Self-pay | Admitting: Physician Assistant

## 2015-05-23 VITALS — BP 124/50 | HR 58 | Ht 69.0 in | Wt 193.8 lb

## 2015-05-23 DIAGNOSIS — R002 Palpitations: Secondary | ICD-10-CM

## 2015-05-23 DIAGNOSIS — R600 Localized edema: Secondary | ICD-10-CM

## 2015-05-23 DIAGNOSIS — E785 Hyperlipidemia, unspecified: Secondary | ICD-10-CM | POA: Diagnosis not present

## 2015-05-23 DIAGNOSIS — I2510 Atherosclerotic heart disease of native coronary artery without angina pectoris: Secondary | ICD-10-CM | POA: Diagnosis not present

## 2015-05-23 DIAGNOSIS — I10 Essential (primary) hypertension: Secondary | ICD-10-CM | POA: Diagnosis not present

## 2015-05-23 DIAGNOSIS — I208 Other forms of angina pectoris: Secondary | ICD-10-CM

## 2015-05-23 DIAGNOSIS — M19071 Primary osteoarthritis, right ankle and foot: Secondary | ICD-10-CM | POA: Diagnosis not present

## 2015-05-23 NOTE — Patient Instructions (Addendum)
Medication Instructions:  Your physician recommends that you continue on your current medications as directed. Please refer to the Current Medication list given to you today.   Labwork: NONE  Testing/Procedures: Your physician has requested that you have a lower extremity venous duplex  PER [facility]ER, PAC TO [place]; DX LEG EDEMA IN RIGHT LEG. This test is an ultrasound of the veins in the legs or arms. It looks at venous blood flow that carries blood from the heart to the legs or arms. Allow one hour for a Lower Venous exam. Allow thirty minutes for an Upper Venous exam. There are no restrictions or special instructions.   Follow-Up: DR. Marlou Porch IN 2-3 MONTHS  Any Other Special Instructions Will Be Listed Below (If Applicable). IF YOU HAVE RECURRENT EPISODE OF RAPID PALPITATIONS, THEN OK TO TAKE EXTRA 1/2 TAB OF THE METOPROLOL TARTRATE AND CALL THE OFFICE [phone] SC[facility], [facility]   CARDIAC REHAB AT [place]   If you need a refill on your cardiac medications before your next appointment, please call your pharmacy.

## 2015-05-24 ENCOUNTER — Ambulatory Visit (HOSPITAL_COMMUNITY)
Admission: RE | Admit: 2015-05-24 | Discharge: 2015-05-24 | Disposition: A | Discharge status: Home / Self Care | Payer: Medicare Other | Source: Ambulatory Visit | Attending: Cardiovascular Disease | Admitting: Cardiovascular Disease

## 2015-05-24 DIAGNOSIS — R600 Localized edema: Secondary | ICD-10-CM | POA: Diagnosis not present

## 2015-05-24 DIAGNOSIS — I10 Essential (primary) hypertension: Secondary | ICD-10-CM | POA: Diagnosis not present

## 2015-05-24 DIAGNOSIS — M7989 Other specified soft tissue disorders: Secondary | ICD-10-CM | POA: Diagnosis not present

## 2015-05-24 DIAGNOSIS — E785 Hyperlipidemia, unspecified: Secondary | ICD-10-CM | POA: Diagnosis not present

## 2015-05-24 DIAGNOSIS — M79604 Pain in right leg: Secondary | ICD-10-CM | POA: Diagnosis not present

## 2015-05-31 ENCOUNTER — Other Ambulatory Visit: Payer: Self-pay | Admitting: Surgery

## 2015-05-31 DIAGNOSIS — J90 Pleural effusion, not elsewhere classified: Secondary | ICD-10-CM

## 2015-06-01 ENCOUNTER — Encounter: Payer: Self-pay | Admitting: Surgery

## 2015-06-01 ENCOUNTER — Ambulatory Visit
Admission: RE | Admit: 2015-06-01 | Discharge: 2015-06-01 | Disposition: A | Discharge status: Home / Self Care | Payer: Medicare Other | Source: Ambulatory Visit | Attending: Surgery | Admitting: Surgery

## 2015-06-01 ENCOUNTER — Ambulatory Visit (INDEPENDENT_AMBULATORY_CARE_PROVIDER_SITE_OTHER): Payer: Self-pay | Admitting: Surgery

## 2015-06-01 VITALS — BP 130/66 | HR 68 | Resp 20 | Ht 69.0 in | Wt 193.0 lb

## 2015-06-01 DIAGNOSIS — I25119 Atherosclerotic heart disease of native coronary artery with unspecified angina pectoris: Secondary | ICD-10-CM

## 2015-06-01 DIAGNOSIS — J90 Pleural effusion, not elsewhere classified: Secondary | ICD-10-CM | POA: Diagnosis not present

## 2015-06-01 DIAGNOSIS — Z951 Presence of aortocoronary bypass graft: Secondary | ICD-10-CM

## 2015-06-01 NOTE — Progress Notes (Signed)
      HPI:  Patient returns for routine postoperative follow-up having undergone CABG x 2 on 04/29/2015. The patient's early postoperative recovery while in the hospital was notable for an uncomplicated postop course. Since hospital discharge the patient reports that he has been feeling well and is back at the gym 3 days per week riding an exercise bike for 30 minutes and other cardio. He did have an episode of tachy-palpitations while at his home in [place], [place] and went to the ER in [place], [place] on 05/21/2015. His BP was 200/100 but ECG wan unremarkable and cardiac enzymes negative. He has had no further problems.   Current Outpatient Prescriptions  Medication Sig Dispense Refill  . aspirin EC 325 MG EC tablet Take 1 tablet (325 mg total) by mouth daily. 30 tablet 0  . atorvastatin (LIPITOR) 40 MG tablet Take 1 tablet (40 mg total) by mouth daily. 30 tablet 6  . lisinopril (PRINIVIL,ZESTRIL) 2.5 MG tablet Take 1 tablet (2.5 mg total) by mouth daily. 30 tablet 1  . metoprolol tartrate (LOPRESSOR) 25 MG tablet Take 0.5 tablets (12.5 mg total) by mouth 2 (two) times daily. For control of high blood pressure (Patient taking differently: Take 12.5 mg by mouth daily. For control of high blood pressure) 30 tablet 0  . diphenhydrAMINE (BENADRYL) 25 mg capsule Take 12.5-25 mg by mouth at bedtime as needed for allergies.      No current facility-administered medications for this visit.    Physical Exam: BP 130/66 mmHg  Pulse 68  Resp 20  Ht 5\' 9"  (1.753 m)  Wt 193 lb (87.544 kg)  BMI 28.49 kg/m2  SpO2  He looks well. Lung exam is clear. Cardiac exam shows a regular rate and rhythm with normal heart sounds. Chest incision is healing well and sternum is stable. The leg incisions are healing well and there is no peripheral edema.    Diagnostic Tests:  CLINICAL DATA: RIGHT pleural effusion, followup, history coronary artery disease post CABG, hypertension, former smoker  EXAM: CHEST 2  VIEW  COMPARISON: 05/02/2015  FINDINGS: Normal heart size post CABG.  Elongation of thoracic aorta.  Mediastinal contours and pulmonary vascularity normal.  Lungs slightly hyperinflated but clear.  No pulmonary infiltrate, pleural effusion or pneumothorax.  Small bibasilar effusions seen on the previous exam resolved.  Osseous structures unremarkable.  IMPRESSION: Post CABG.  Resolution of previously identified small bibasilar pleural effusions.   Electronically Signed  By: Lavonia Dana M.D.  On: 06/01/2015 10:07  Impression:  Overall I think he is doing well. He is very active and wants to continue increasing his activity at the gym. I told him he could drive his car but should not lift anything heavier than 10 lbs or swing a golf club for three months postop.   Plan:  He will continue to follow up with Dr. Marlou Porch and Dr. Felipa Eth and will contact me if there are any problems with his incisions.    [name], MD [facility] [phone]

## 2015-06-09 ENCOUNTER — Telehealth: Payer: Self-pay | Admitting: Cardiology

## 2015-06-09 DIAGNOSIS — E7800 Pure hypercholesterolemia, unspecified: Secondary | ICD-10-CM

## 2015-06-09 DIAGNOSIS — Z79899 Other long term (current) drug therapy: Secondary | ICD-10-CM

## 2015-06-09 NOTE — Telephone Encounter (Signed)
Patient called complaining that Lipitor is causing muscle cramping. He would like to decrease the dosage. Patient also c/o waking up at 0300-0400 because of fast HR with skipped beats.  Normally, his HR is in the 50-60s. His last BP was 132/65. But during the episodes in the middle of the night he reports HR around 90-100 and irregular. He has never taken extra metoprolol, he always "comes out of it" and goes back to sleep. Patient st he drinks caffeine, but not later in the day. He denies any stress. Patient is leaving to go to the mountains tomorrow and would like Dr. Marlou Porch' recommendations prior to his departure.

## 2015-06-09 NOTE — Telephone Encounter (Signed)
New Message   Pt wife wants to know if pt can cut back on the dosage 40mg  of atorvistain  Muscle cramps  Pt is waking up at 3am and 4am with PVC that continues he wants to know  Or what causes it:  if it is due to his diet

## 2015-06-10 NOTE — Telephone Encounter (Signed)
Lets decrease to atorvastatin 20mg  and see how he does. Dennis Furbish, MD

## 2015-06-10 NOTE — Telephone Encounter (Signed)
Talked with patient Told him that we haven't heard back from Dr. Marlou Porch He will be [place] in the mountains, number to call him back is [phone] may leave a message He understands that if his symptoms become worse or more concerning that he needs to know where to seek medical care at

## 2015-06-10 NOTE — Telephone Encounter (Signed)
Patient called back Does not recall talking to a nurse this morning Told him we haven't heard back from Dr. Marlou Porch. If he starts feeling worse or has concerning symptoms he needs to seek medical care at a ER or Urgent Care Clinic He has been on the road all day traveling to the mountains  Routed to Dr. Marlou Porch

## 2015-06-10 NOTE — Telephone Encounter (Signed)
F/u    Pt calling back f/u on previous message. Pt notified that no recommendations have been received yet from Dr. Marlou Porch.

## 2015-06-10 NOTE — Telephone Encounter (Signed)
Follow Up  Pt wife called back. Please assist

## 2015-06-13 MED ORDER — ATORVASTATIN CALCIUM 40 MG PO TABS: 20.0000 mg | ORAL_TABLET | Freq: Every day | ORAL | Status: DC

## 2015-06-13 NOTE — Telephone Encounter (Signed)
Called patient and instructed him to decrease dose of atorvastatin to 20 mg

## 2015-06-25 DIAGNOSIS — Z23 Encounter for immunization: Secondary | ICD-10-CM | POA: Diagnosis not present

## 2015-07-06 NOTE — Telephone Encounter (Signed)
ERROR

## 2015-07-07 DIAGNOSIS — J209 Acute bronchitis, unspecified: Secondary | ICD-10-CM | POA: Diagnosis not present

## 2015-07-07 DIAGNOSIS — J329 Chronic sinusitis, unspecified: Secondary | ICD-10-CM | POA: Diagnosis not present

## 2015-07-11 ENCOUNTER — Other Ambulatory Visit: Payer: Self-pay | Admitting: Cardiology

## 2015-07-11 MED ORDER — METOPROLOL TARTRATE 25 MG PO TABS: 12.5000 mg | ORAL_TABLET | Freq: Two times a day (BID) | ORAL | Status: DC

## 2015-07-25 ENCOUNTER — Other Ambulatory Visit: Payer: Self-pay | Admitting: Surgical

## 2015-07-27 ENCOUNTER — Telehealth: Payer: Self-pay | Admitting: *Deleted

## 2015-07-27 NOTE — Telephone Encounter (Signed)
Left message for pt to continue meds as RXed.  If questions, he should return the call and if he needs refills he should contact his pharmacy.

## 2015-07-27 NOTE — Telephone Encounter (Signed)
Patient would like to know if he should continue to take the lisinopril that was prescribed to him by Dr Girtha Rm when he was in the hospital. If not, he would like to know if he should go back to what he was previously taking. Please advise. Thanks, MI

## 2015-07-28 ENCOUNTER — Other Ambulatory Visit: Payer: Self-pay | Admitting: Cardiology

## 2015-07-28 MED ORDER — LISINOPRIL 2.5 MG PO TABS: 2.5000 mg | ORAL_TABLET | Freq: Every day | ORAL | Status: DC

## 2015-08-08 ENCOUNTER — Ambulatory Visit (INDEPENDENT_AMBULATORY_CARE_PROVIDER_SITE_OTHER): Payer: Medicare Other | Admitting: Cardiology

## 2015-08-08 ENCOUNTER — Encounter: Payer: Self-pay | Admitting: Cardiology

## 2015-08-08 VITALS — BP 128/70 | HR 53 | Ht 69.0 in | Wt 198.8 lb

## 2015-08-08 DIAGNOSIS — I10 Essential (primary) hypertension: Secondary | ICD-10-CM

## 2015-08-08 DIAGNOSIS — E785 Hyperlipidemia, unspecified: Secondary | ICD-10-CM | POA: Diagnosis not present

## 2015-08-08 DIAGNOSIS — I2510 Atherosclerotic heart disease of native coronary artery without angina pectoris: Secondary | ICD-10-CM | POA: Diagnosis not present

## 2015-08-08 DIAGNOSIS — I2583 Coronary atherosclerosis due to lipid rich plaque: Secondary | ICD-10-CM

## 2015-08-08 DIAGNOSIS — Z951 Presence of aortocoronary bypass graft: Secondary | ICD-10-CM | POA: Diagnosis not present

## 2015-08-08 NOTE — Patient Instructions (Signed)

## 2015-08-08 NOTE — Progress Notes (Signed)
Cardiology Office Note    Date:  08/08/2015   ID:  Dennis York, DOB 1941/02/02, MRN [id number]  PCP:  Mathews Argyle, MD  Cardiologist:   Candee Furbish, MD     History of Present Illness:  Dennis York is a 75 y.o. male post CABG, Dr. Cyndia Bent, prior PCI to LAD in 1994, hypertension, hyperlipidemia, prior alcohol use in remission, nephrolithiasis, here for post bypass follow-up.  I reviewed Dr. Vivi Martens last note. He was doing well. No heavy lifting for 3 months. No swimming and golf club for 3 months. No signs of infection.  He had called stating that he thought the atorvastatin was causing some issues, aches. We decreased the dosage to 20 mg because of this.    he has been doing very well. [place] [employer]. Does have some left-sided chest wall soreness. Also has some right sided lower extremity edema. Overall doing well. Pleased.    Past Medical History  Diagnosis Date  . Alcohol abuse, in remission   . Hyperlipidemia   . HTN (hypertension)   . Anxiety   . Normal nuclear stress test     04/2012 [facility]  . Depression   . Complication of anesthesia     allergic reactions to Barbituates  . CAD (coronary artery disease)     Prior PCI to LAD in 1994 sees Dr Etter Sjogren  . Kidney stones     hx of   . GERD (gastroesophageal reflux disease)   . H/O hiatal hernia   . Swelling of both ankles     "if I'm on my feet alot or flying to [place] to see daughter" (05/01/2012)  . Lower GI bleeding     "had colonoscopy; it was internal  hemorrhoids"   . Arthritis     "hands, wrists, ankle on right" (05/01/2012)  . Hypocholesterolemia   . Left knee DJD   . Hiatal hernia   . H/O macrocytic anemia 10/2011    , Mild, Hemoglobin 12.5  . Vitamin B 12 deficiency 10/2011    , Start oral replacement  . Iron deficiency   . SIADH (syndrome of inappropriate ADH production) [facility])     Past Surgical History  Procedure Laterality Date  . Replacement total knee  08/2008; 04/30/2012      right; left  . US echocardiography  09/04/2005    EF 55-60%  . Joint replacement    . Cardiovascular stress test    . Eye surgery      bilateral cataracts removed 2013  . Tonsillectomy  1946  . Minor hemorrhoidectomy  ~ 1960  . Knee arthroscopy  ~ 2001    left  . Cataract extraction w/ intraocular lens  implant, bilateral  ~ 2010; 03/2012    right; left  . Cardiac catheterization  01/30/1993    EF 55-60%  . Coronary angioplasty  1990's  . Total knee arthroplasty  04/30/2012    Procedure: TOTAL KNEE ARTHROPLASTY;  Surgeon: Ninetta Lights, MD;  Location: [facility];  Service: Orthopedics;  Laterality: Left;  left total knee arthroplasty  . Cardiac catheterization N/A 04/22/2015    Procedure: Left Heart Cath and Coronary Angiography;  Surgeon: Jerline Pain, MD;  Location: [place] [facility];  Service: Cardiovascular;  Laterality: N/A;  . Coronary artery bypass graft N/A 04/29/2015    Procedure: CORONARY ARTERY BYPASS GRAFTING (CABG) x2 using left internal mammory artery and right saphenous leg vein harvested endoscopically;  Surgeon: [name], MD;  Location: [place];  Service: Open Heart Surgery;  Laterality: N/A;  . Tee without cardioversion N/A 04/29/2015    Procedure: TRANSESOPHAGEAL ECHOCARDIOGRAM (TEE);  Surgeon: [name], MD;  Location: [facility];  Service: Open Heart Surgery;  Laterality: N/A;    Outpatient Prescriptions Prior to Visit  Medication Sig Dispense Refill  . aspirin EC 325 MG EC tablet Take 1 tablet (325 mg total) by mouth daily. 30 tablet 0  . atorvastatin (LIPITOR) 40 MG tablet Take 0.5 tablets (20 mg total) by mouth daily. 30 tablet 6  . lisinopril (PRINIVIL,ZESTRIL) 2.5 MG tablet Take 1 tablet (2.5 mg total) by mouth daily. 30 tablet 11  . metoprolol tartrate (LOPRESSOR) 25 MG tablet Take 0.5 tablets (12.5 mg total) by mouth 2 (two) times daily. For control of high blood pressure 30 tablet 1  . diphenhydrAMINE (BENADRYL) 25 mg capsule Take 12.5-25 mg by mouth at  bedtime as needed for allergies.      No facility-administered medications prior to visit.     Allergies:   Ativan; Barbiturates; Bee venom; Penicillins; Morphine and related; Percocet; and Vicodin   Social History   Social History  . Marital Status: Married    Spouse Name: N/A  . Number of Children: N/A  . Years of Education: N/A   Social History Main Topics  . Smoking status: Former Smoker -- 2.00 packs/day for 30 years    Types: Cigarettes    Quit date: 02/28/1995  . Smokeless tobacco: Never Used  . Alcohol Use: No     Comment: 05/01/2012 "totally quit alcohol in ~ 10 months; I was drinking too much beer so I quit"  . Drug Use: No  . Sexual Activity: Yes   Other Topics Concern  . None   Social History Narrative     Family History:  The patient's family history includes Breast cancer in his mother; Heart disease in his father.   ROS:   Please see the history of present illness.    ROS All other systems reviewed and are negative.   PHYSICAL EXAM:   VS:  BP 128/70 mmHg  Pulse 53  Ht 5\' 9"  (1.753 m)  Wt 198 lb 12.8 oz (90.175 kg)  BMI 29.34 kg/m2   GEN: Well nourished, well developed, in no acute distress HEENT: normal Neck: no JVD, carotid bruits, or masses Cardiac: RRR; no murmurs, rubs, or gallops,no edema  Respiratory:  clear to auscultation bilaterally, normal work of breathing GI: soft, nontender, nondistended, + BS MS: no deformity or atrophy Skin: warm and dry, no rash Neuro:  Alert and Oriented x 3, Strength and sensation are intact Psych: euthymic mood, full affect  Wt Readings from Last 3 Encounters:  08/08/15 198 lb 12.8 oz (90.175 kg)  06/01/15 193 lb (87.544 kg)  05/23/15 193 lb 12.8 oz (87.907 kg)      Studies/Labs Reviewed:   EKG:  EKG is not ordered today.    Recent Labs: 04/28/2015: ALT 23 04/30/2015: Magnesium 1.7 05/02/2015: BUN 15; Creatinine, Ser 0.98; Hemoglobin 9.3*; Platelets 139*; Potassium 4.1; Sodium 134*   Lipid  Panel    Component Value Date/Time   CHOL 210* 10/26/2014 1558   TRIG 163.0* 10/26/2014 1558   HDL 38.40* 10/26/2014 1558   CHOLHDL 5 10/26/2014 1558   VLDL 32.6 10/26/2014 1558   LDLCALC 139* 10/26/2014 1558   LDLDIRECT 136.3 02/27/2011 0841    Additional studies/ records that were reviewed today include:   prior catheterization, records, labs operative report    ASSESSMENT:  1. S/P CABG x 2   2. Essential hypertension   3. Coronary artery disease due to lipid rich plaque   4. Hyperlipidemia      PLAN:  In order of problems listed above:  1. Post bypass, aggressive secondary prevention-aspirin, statin, beta blocker, ACE inhibitor. No heavy lifting for 3 months. 2. Hypertension-Under reasonable control. Doing well, medicines reviewed 3. Coronary artery disease-severe native vessel CAD resulting in bypass surgery. This was discovered by abnormal stress test. We discussed the implications of bypass, residual effects , need for continued  prevention efforts. 4. Hyperlipidemia-atorvastatin reduced dose to 20 mg because of body aches.    Medication Adjustments/Labs and Tests Ordered: Current medicines are reviewed at length with the patient today.  Concerns regarding medicines are outlined above.  Medication changes, Labs and Tests ordered today are listed in the Patient Instructions below. Patient Instructions  Medication Instructions:  The current medical regimen is effective;  continue present plan and medications.  Follow-Up: Follow up in 6 months with Dr. Marlou Porch.  You will receive a letter in the mail 2 months before you are due.  Please call us when you receive this letter to schedule your follow up appointment.  If you need a refill on your cardiac medications before your next appointment, please call your pharmacy.  Thank you for choosing [facility]!!            Signed, Candee Furbish, MD  08/08/2015 8:17 AM    [facility] [place], [place], [place]  [address] Phone: [phone]; Fax: [phone]

## 2015-08-30 DIAGNOSIS — H35373 Puckering of macula, bilateral: Secondary | ICD-10-CM | POA: Diagnosis not present

## 2015-09-14 ENCOUNTER — Other Ambulatory Visit: Payer: Self-pay | Admitting: Cardiology

## 2015-11-23 ENCOUNTER — Other Ambulatory Visit: Payer: Self-pay | Admitting: Cardiology

## 2016-02-14 ENCOUNTER — Ambulatory Visit (INDEPENDENT_AMBULATORY_CARE_PROVIDER_SITE_OTHER): Payer: Medicare Other | Admitting: Cardiology

## 2016-02-14 ENCOUNTER — Encounter: Payer: Self-pay | Admitting: Cardiology

## 2016-02-14 VITALS — BP 140/70 | HR 48 | Ht 68.0 in | Wt 195.8 lb

## 2016-02-14 DIAGNOSIS — E785 Hyperlipidemia, unspecified: Secondary | ICD-10-CM | POA: Diagnosis not present

## 2016-02-14 DIAGNOSIS — Z951 Presence of aortocoronary bypass graft: Secondary | ICD-10-CM | POA: Diagnosis not present

## 2016-02-14 DIAGNOSIS — I10 Essential (primary) hypertension: Secondary | ICD-10-CM | POA: Diagnosis not present

## 2016-02-14 DIAGNOSIS — I2510 Atherosclerotic heart disease of native coronary artery without angina pectoris: Secondary | ICD-10-CM | POA: Diagnosis not present

## 2016-02-14 LAB — LIPID PANEL
CHOL/HDL RATIO: 2.7 ratio (ref <=5.0)
CHOLESTEROL: 114 mg/dL — AB (ref 125–200)
HDL: 42 mg/dL (ref >=40)
LDL CALC: 54 mg/dL (ref <130)
TRIGLYCERIDES: 89 mg/dL (ref <150)
VLDL: 18 mg/dL (ref <30)

## 2016-02-14 LAB — ALT: ALT: 16 U/L (ref 9–46)

## 2016-02-14 MED ORDER — ASPIRIN EC 81 MG PO TBEC: 81.0000 mg | DELAYED_RELEASE_TABLET | Freq: Every day | ORAL | Status: DC

## 2016-02-14 NOTE — Patient Instructions (Signed)
Medication Instructions:  1. CHANGE ASPIRIN TO 81 MG DAILY  2. STOP LISINOPRIL  3. LOSARTAN 25 MG DAILY HAS BEEN ADDED TO YOUR MEDICATION LIST TODAY    Labwork: TODAY LIPID AND ALT  Testing/Procedures: NONE  Follow-Up: Your physician wants you to follow-up in: [place] will receive a reminder letter in the mail two months in advance. If you don't receive a letter, please call our office to schedule the follow-up appointment.   Any Other Special Instructions Will Be Listed Below (If Applicable).     If you need a refill on your cardiac medications before your next appointment, please call your pharmacy.

## 2016-02-14 NOTE — Progress Notes (Signed)
Cardiology Office Note    Date:  02/14/2016   ID:  Vinit Gills, DOB May 15, 1941, MRN [id number]  PCP:  Mathews Argyle, MD  Cardiologist:   Candee Furbish, MD     History of Present Illness:  Dennis York is a 75 y.o. male post CABG 04/27/15, Dr. Cyndia Bent, prior PCI to LAD in 1994, hypertension, hyperlipidemia, prior alcohol use in remission, nephrolithiasis, here for post bypass follow-up.  I reviewed Dr. Vivi Martens last note. He was doing well.   Previously had called taking that some achiness was being caused by atorvastatin 40 mg. Apparently we adjusted the dose to 20 mg but now on his medication list is now at 40 mg once again. Continue.  He has been doing very well. [employer]. Does have some left-sided chest wall soreness. 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 He was waterskiing recently, 9 months post bypass. Also has some right sided lower extremity edema. Overall doing well. Pleased. Unfortunately has a granddaughter in [place] who is having difficulties with eating disorder.    Past Medical History:  Diagnosis Date  . Alcohol abuse, in remission   . Anxiety   . Arthritis    "hands, wrists, ankle on right" (05/01/2012)  . CAD (coronary artery disease)    Prior PCI to LAD in 1994 sees Dr Etter Sjogren  . Complication of anesthesia    allergic reactions to Barbituates  . Depression   . GERD (gastroesophageal reflux disease)   . H/O hiatal hernia   . H/O macrocytic anemia 10/2011   , Mild, Hemoglobin 12.5  . Hiatal hernia   . HTN (hypertension)   . Hyperlipidemia   . Hypocholesterolemia   . Iron deficiency   . Kidney stones    hx of   . Left knee DJD   . Lower GI bleeding    "had colonoscopy; it was internal  hemorrhoids"   . Normal nuclear stress test    04/2012 [facility]  . SIADH (syndrome of inappropriate ADH production) ([place])   . Swelling of both ankles    "if I'm on my feet alot or flying to overseas to see daughter" (05/01/2012)  .  Vitamin B 12 deficiency 10/2011   , Start oral replacement    Past Surgical History:  Procedure Laterality Date  . CARDIAC CATHETERIZATION  01/30/1993   EF 55-60%  . CARDIAC CATHETERIZATION N/A 04/22/2015   Procedure: Left Heart Cath and Coronary Angiography;  Surgeon: Jerline Pain, MD;  Location: [facility];  Service: Cardiovascular;  Laterality: N/A;  . CARDIOVASCULAR STRESS TEST    . CATARACT EXTRACTION W/ INTRAOCULAR LENS  IMPLANT, BILATERAL  ~ 2010; 03/2012   right; left  . CORONARY ANGIOPLASTY  1990's  . CORONARY ARTERY BYPASS GRAFT N/A 04/29/2015   Procedure: CORONARY ARTERY BYPASS GRAFTING (CABG) x2 using left internal mammory artery and right saphenous leg vein harvested endoscopically;  Surgeon: [name], MD;  Location: [place] [facility];  Service: Open Heart Surgery;  Laterality: N/A;  . EYE SURGERY     bilateral cataracts removed 2013  . JOINT REPLACEMENT    . KNEE ARTHROSCOPY  ~ 2001   left  . MINOR HEMORRHOIDECTOMY  ~ 1960  . REPLACEMENT TOTAL KNEE  08/2008; 04/30/2012   right; left  . TEE WITHOUT CARDIOVERSION N/A 04/29/2015   Procedure: TRANSESOPHAGEAL ECHOCARDIOGRAM (TEE);  Surgeon: [name], MD;  Location: [place];  Service: Open Heart Surgery;  Laterality: N/A;  . TONSILLECTOMY  1946  .  TOTAL KNEE ARTHROPLASTY  04/30/2012   Procedure: TOTAL KNEE ARTHROPLASTY;  Surgeon: Ninetta Lights, MD;  Location: [place];  Service: Orthopedics;  Laterality: Left;  left total knee arthroplasty  . US ECHOCARDIOGRAPHY  09/04/2005   EF 55-60%    Outpatient Medications Prior to Visit  Medication Sig Dispense Refill  . atorvastatin (LIPITOR) 40 MG tablet TAKE ONE TABLET BY MOUTH ONCE DAILY 30 tablet 2  . metoprolol tartrate (LOPRESSOR) 25 MG tablet TAKE ONE-HALF TABLET BY MOUTH TWICE DAILY TO CONTROL HIGH BLOOD PRESSURE 30 tablet 4  . aspirin EC 325 MG EC tablet Take 1 tablet (325 mg total) by mouth daily. 30 tablet 0  . lisinopril (PRINIVIL,ZESTRIL) 2.5 MG tablet Take 1 tablet  (2.5 mg total) by mouth daily. 30 tablet 11   No facility-administered medications prior to visit.      Allergies:   Ativan [lorazepam]; Barbiturates; Bee venom; Penicillins; Morphine and related; Percocet [oxycodone-acetaminophen]; and Vicodin [hydrocodone-acetaminophen]   Social History   Social History  . Marital status: Married    Spouse name: N/A  . Number of children: N/A  . Years of education: N/A   Social History Main Topics  . Smoking status: Former Smoker    Packs/day: 2.00    Years: 30.00    Types: Cigarettes    Quit date: 02/28/1995  . Smokeless tobacco: Never Used  . Alcohol use No     Comment: 05/01/2012 "totally quit alcohol in ~ 10 months; I was drinking too much beer so I quit"  . Drug use: No  . Sexual activity: Yes   Other Topics Concern  . None   Social History Narrative  . None     Family History:  The patient's family history includes Breast cancer in his mother; Heart disease in his father.   ROS:   Please see the history of present illness.   No bleeding, no syncope, no orthopnea, no PND ROS All other systems reviewed and are negative.   PHYSICAL EXAM:   VS:  BP 140/70   Pulse (!) 48   Ht 5\' 8"  (1.727 m)   Wt 195 lb 12.8 oz (88.8 kg)   BMI 29.77 kg/m    GEN: Well nourished, well developed, in no acute distress HEENT: normal Neck: no JVD, carotid bruits, or masses Cardiac: RRR; no murmurs, rubs, or gallops,no edema  Respiratory:  clear to auscultation bilaterally, normal work of breathing GI: soft, nontender, nondistended, + BS MS: no deformity or atrophy Skin: warm and dry, no rash Neuro:  Alert and Oriented x 3, Strength and sensation are intact Psych: euthymic mood, full affect  Wt Readings from Last 3 Encounters:  02/14/16 195 lb 12.8 oz (88.8 kg)  08/08/15 198 lb 12.8 oz (90.2 kg)  06/01/15 193 lb (87.5 kg)      Studies/Labs Reviewed:   EKG:  EKG is not ordered today.    Recent Labs: 04/28/2015: ALT 23 04/30/2015:  Magnesium 1.7 05/02/2015: BUN 15; Creatinine, Ser 0.98; Hemoglobin 9.3; Platelets 139; Potassium 4.1; Sodium 134   Lipid Panel    Component Value Date/Time   CHOL 210 (H) 10/26/2014 1558   TRIG 163.0 (H) 10/26/2014 1558   HDL 38.40 (L) 10/26/2014 1558   CHOLHDL 5 10/26/2014 1558   VLDL 32.6 10/26/2014 1558   LDLCALC 139 (H) 10/26/2014 1558   LDLDIRECT 136.3 02/27/2011 0841    Additional studies/ records that were reviewed today include:   prior catheterization, records, labs operative report    ASSESSMENT:  1. Coronary artery disease involving native coronary artery of native heart without angina pectoris   2. S/P CABG x 2   3. Hyperlipidemia   4. Essential hypertension      PLAN:  In order of problems listed above:  1. Post bypass, aggressive secondary prevention-aspirin81, statin, beta blocker, ACE inhibitor cough changed to losartan 25 mg. Mild chest ache after waterskiing 9 months post bypass, central scar is normal. No instability. 2. Hypertension-Under reasonable control. At home usually in the 0000000 to 123456 systolic. Doing well, medicines reviewed 3. Coronary artery disease-severe native vessel CAD resulting in bypass surgery on 04/29/15. This was discovered by abnormal stress test. We discussed the implications of bypass, residual effects , need for continued  prevention efforts. 4. Hyperlipidemia-atorvastatin reduced dose to 40 mg because of body aches/cramps. Checking lipid profile today and ALT 5. Palpitations-taking low-dose beta blocker. Occasionally will have a skipped beat every fourth or fifth beat.    Medication Adjustments/Labs and Tests Ordered: Current medicines are reviewed at length with the patient today.  Concerns regarding medicines are outlined above.  Medication changes, Labs and Tests ordered today are listed in the Patient Instructions below. Patient Instructions  Medication Instructions:  1. CHANGE ASPIRIN TO 81 MG DAILY  2. STOP  LISINOPRIL  3. LOSARTAN 25 MG DAILY HAS BEEN ADDED TO YOUR MEDICATION LIST TODAY    Labwork: TODAY LIPID AND ALT  Testing/Procedures: NONE  Follow-Up: Your physician wants you to follow-up in: [place] will receive a reminder letter in the mail two months in advance. If you don't receive a letter, please call our office to schedule the follow-up appointment.   Any Other Special Instructions Will Be Listed Below (If Applicable).     If you need a refill on your cardiac medications before your next appointment, please call your pharmacy.        Signed, Candee Furbish, MD  02/14/2016 9:48 AM    [facility] [address], [place], [place]  [address] Phone: [phone]; Fax: [phone]

## 2016-02-17 ENCOUNTER — Telehealth: Payer: Self-pay | Admitting: Cardiology

## 2016-02-17 NOTE — Telephone Encounter (Signed)
New Message  Pt wife states she is returning a call from 8/17 about pt lipid results. Please call back to discuss

## 2016-02-17 NOTE — Telephone Encounter (Signed)
Left detailed message on machine with lab results per wife request.

## 2016-02-23 DIAGNOSIS — R21 Rash and other nonspecific skin eruption: Secondary | ICD-10-CM | POA: Diagnosis not present

## 2016-03-06 ENCOUNTER — Other Ambulatory Visit: Payer: Self-pay | Admitting: Cardiology

## 2016-04-12 DIAGNOSIS — Z23 Encounter for immunization: Secondary | ICD-10-CM | POA: Diagnosis not present

## 2016-04-20 ENCOUNTER — Telehealth: Payer: Self-pay | Admitting: Cardiology

## 2016-04-20 NOTE — Telephone Encounter (Signed)
Lm to cb to discuss Pt on Metoprolol 25 mg 1/2 tablet BID for palps. Losartan 25 mg a day HX: CAD, HTN, Palps and hyperlipidemia.

## 2016-04-20 NOTE — Telephone Encounter (Signed)
Was finally able to speak with pt who is reporting while he is very active with exercise and physical labor he does have an occasional extra or skipped beat.  He has noticed for the last couple of nights when he wakes to go to the bathroom his HR seems irregularly slow but when he gets up to walk around it returns to normal.  He denies any dizziness, chest pain or SOB during this time.  He does not know what his BP is but thinks his HR is around 40 bpm but doesn't have a way to check this.  Advised to continue to monitor.  He can try to hold his pm dose of Metoprolol before bedtime to see if this makes a difference.  Advised I will review with Dr Marlou Porch and call him back with further orders however he may need to wear a monitor to determine his rate and rhythm more accurately.  He is going out of town this weekend but was advised to call back if further concerns.  He states understanding.

## 2016-04-20 NOTE — Telephone Encounter (Signed)
PT RETURNING CALL TO PAM [phone]

## 2016-04-20 NOTE — Telephone Encounter (Signed)
Lm to cb.

## 2016-04-20 NOTE — Telephone Encounter (Signed)
New Message  Pt call requesting to speak with RN about his heart rate. Pt states he thinks he is taking to many beta blockers and would like to discuss with RN. Please call back to advise.

## 2016-04-20 NOTE — Telephone Encounter (Signed)
Follow up   Pt returning call for rn

## 2016-04-22 NOTE — Telephone Encounter (Signed)
OK with changing metoprolol 12.5 BID to PRN. Thanks Candee Furbish, MD

## 2016-04-23 NOTE — Telephone Encounter (Signed)
Called to f/u with pt to see how he is feeling.  Left message for pt to c/b with further needs or concerns.

## 2016-05-14 ENCOUNTER — Emergency Department (HOSPITAL_COMMUNITY)
Admission: EM | Admit: 2016-05-14 | Discharge: 2016-05-15 | Disposition: A | Discharge status: Home / Self Care | Payer: Medicare Other | Attending: Emergency Medicine | Admitting: Emergency Medicine

## 2016-05-14 ENCOUNTER — Encounter (HOSPITAL_COMMUNITY): Payer: Self-pay | Admitting: Emergency Medicine

## 2016-05-14 DIAGNOSIS — I2510 Atherosclerotic heart disease of native coronary artery without angina pectoris: Secondary | ICD-10-CM | POA: Diagnosis not present

## 2016-05-14 DIAGNOSIS — Z87891 Personal history of nicotine dependence: Secondary | ICD-10-CM | POA: Insufficient documentation

## 2016-05-14 DIAGNOSIS — I10 Essential (primary) hypertension: Secondary | ICD-10-CM | POA: Insufficient documentation

## 2016-05-14 DIAGNOSIS — Z7982 Long term (current) use of aspirin: Secondary | ICD-10-CM | POA: Insufficient documentation

## 2016-05-14 DIAGNOSIS — R42 Dizziness and giddiness: Secondary | ICD-10-CM | POA: Insufficient documentation

## 2016-05-14 LAB — I-STAT TROPONIN, ED
TROPONIN I, POC: 0.01 ng/mL (ref 0.00–0.08)
Troponin i, poc: 0 ng/mL (ref 0.00–0.08)

## 2016-05-14 LAB — COMPREHENSIVE METABOLIC PANEL
ALBUMIN: 4.1 g/dL (ref 3.5–5.0)
ALK PHOS: 72 U/L (ref 38–126)
ALT: 23 U/L (ref 17–63)
ANION GAP: 9 (ref 5–15)
AST: 32 U/L (ref 15–41)
BUN: 7 mg/dL (ref 6–20)
CALCIUM: 9.6 mg/dL (ref 8.9–10.3)
CO2: 22 mmol/L (ref 22–32)
Chloride: 101 mmol/L (ref 101–111)
Creatinine, Ser: 0.98 mg/dL (ref 0.61–1.24)
GFR calc Af Amer: >60 mL/min (ref >60)
GFR calc non Af Amer: >60 mL/min (ref >60)
GLUCOSE: 107 mg/dL — AB (ref 65–99)
POTASSIUM: 3.9 mmol/L (ref 3.5–5.1)
SODIUM: 132 mmol/L — AB (ref 135–145)
Total Bilirubin: 1 mg/dL (ref 0.3–1.2)
Total Protein: 7.4 g/dL (ref 6.5–8.1)

## 2016-05-14 LAB — CBC WITH DIFFERENTIAL/PLATELET
BASOS ABS: 0.1 10*3/uL (ref 0.0–0.1)
BASOS PCT: 1 %
EOS ABS: 0.3 10*3/uL (ref 0.0–0.7)
Eosinophils Relative: 3 %
HCT: 39.9 % (ref 39.0–52.0)
HEMOGLOBIN: 14.1 g/dL (ref 13.0–17.0)
Lymphocytes Relative: 48 %
Lymphs Abs: 4.5 10*3/uL — ABNORMAL HIGH (ref 0.7–4.0)
MCH: 32.6 pg (ref 26.0–34.0)
MCHC: 35.3 g/dL (ref 30.0–36.0)
MCV: 92.1 fL (ref 78.0–100.0)
MONOS PCT: 8 %
Monocytes Absolute: 0.8 10*3/uL (ref 0.1–1.0)
NEUTROS PCT: 40 %
Neutro Abs: 3.7 10*3/uL (ref 1.7–7.7)
Platelets: 262 10*3/uL (ref 150–400)
RBC: 4.33 MIL/uL (ref 4.22–5.81)
RDW: 13.2 % (ref 11.5–15.5)
WBC: 9.3 10*3/uL (ref 4.0–10.5)

## 2016-05-14 NOTE — ED Triage Notes (Signed)
Pt. reports elevated blood pressure at home this evening 190/100 with anxiety attack , lightheaded and brief mild blurred vision . Denies chest pain / respirations unlabored .

## 2016-05-14 NOTE — ED Provider Notes (Signed)
By signing my name below, I, Dennis York, attest that this documentation has been prepared under the direction and in the presence of physician practitioner, Delice Bison Jasmain Ahlberg, DO. Electronically Signed: Dora York, Scribe. 05/14/2016. 11:14 PM.  TIME SEEN: 11:14 PM  CHIEF COMPLAINT: Anxiety, Hypertension  HPI: HPI Comments: Dennis York is a 75 y.o. male with PMHx significant for CAD s/p CABG and HTN who presents to the Emergency Department complaining of sudden onset, constant, gradually improving, anxiety beginning a few hours ago. Pt reports he came home from work around 8 PM tonight and suddenly felt lightheaded. He states he checked his blood pressure after he became lightheaded and his BP was elevated.  He states he became very nervous/anxious and decided to come to the ER to get evaluated. He reports the episode lasted 15-20 minutes.  Started when he was coming home from work.  Pt works regularly installing irrigation.  States today was not a particularly strenuous day but his help was not present today.  He states he feels much better currently and notes he has not had an anxiety attack in several years.  Pt uses Cozaar and Lopressor for his blood pressure and notes he is out of his Cozaar. He denies CP, chest tightness, SOB, nausea, vomiting, diarrhea, melena, hematochezia, fever, chills, cough, headache, numbness, weakness, or any other associated symptoms.    PCP: Dr. Felipa Eth   ROS: See HPI Constitutional: no fever  Eyes: no drainage  ENT: no runny nose   Cardiovascular:  no chest pain  Resp: no SOB  GI: no vomiting GU: no dysuria Integumentary: no rash  Allergy: no hives  Musculoskeletal: no leg swelling  Neurological: no slurred speech ROS otherwise negative  PAST MEDICAL HISTORY/PAST SURGICAL HISTORY:  Past Medical History:  Diagnosis Date  . Alcohol abuse, in remission   . Anxiety   . Arthritis    "hands, wrists, ankle on right" (05/01/2012)  . CAD (coronary  artery disease)    Prior PCI to LAD in 1994 sees Dr Etter Sjogren  . Complication of anesthesia    allergic reactions to Barbituates  . Depression   . GERD (gastroesophageal reflux disease)   . H/O hiatal hernia   . H/O macrocytic anemia 10/2011   , Mild, Hemoglobin 12.5  . Hiatal hernia   . HTN (hypertension)   . Hyperlipidemia   . Hypocholesterolemia   . Iron deficiency   . Kidney stones    hx of   . Left knee DJD   . Lower GI bleeding    "had colonoscopy; it was internal  hemorrhoids"   . Normal nuclear stress test    04/2012 [facility]  . SIADH (syndrome of inappropriate ADH production) ([place])   . Swelling of both ankles    "if I'm on my feet alot or flying to [place] to see daughter" (05/01/2012)  . Vitamin B 12 deficiency 10/2011   , Start oral replacement    MEDICATIONS:  Prior to Admission medications   Medication Sig Start Date End Date Taking? Authorizing Provider  aspirin 81 MG tablet Take 1 tablet (81 mg total) by mouth daily. 02/14/16   Jerline Pain, MD  atorvastatin (LIPITOR) 40 MG tablet TAKE ONE TABLET BY MOUTH ONCE DAILY 11/25/15   Jerline Pain, MD  losartan (COZAAR) 25 MG tablet Take 25 mg by mouth daily. 11/23/15   Historical Provider, MD  metoprolol tartrate (LOPRESSOR) 25 MG tablet TAKE ONE-HALF TABLET BY MOUTH TWICE DAILY TO CONTROL HIGH BLOOD PRESSURE  03/06/16   Jerline Pain, MD    ALLERGIES:  Allergies  Allergen Reactions  . Ativan [Lorazepam] Other (See Comments)    ATAXIA LASTING FOR SEVERAL DAYS  . Barbiturates Swelling and Other (See Comments)    "skin on head of penis came off; raw meat"  . Bee Venom Anaphylaxis    Patient carries an epi pen  . Penicillins Anaphylaxis  . Morphine And Related Itching  . Percocet [Oxycodone-Acetaminophen] Itching  . Vicodin [Hydrocodone-Acetaminophen] Itching    SOCIAL HISTORY:  Social History  Substance Use Topics  . Smoking status: Former Smoker    Packs/day: 2.00    Years: 30.00    Types:  Cigarettes    Quit date: 02/28/1995  . Smokeless tobacco: Never Used  . Alcohol use No     Comment: 05/01/2012 "totally quit alcohol in ~ 10 months; I was drinking too much beer so I quit"    FAMILY HISTORY: Family History  Problem Relation Age of Onset  . Breast cancer Mother   . Heart disease Father   . Hypertension    . Depression      EXAM: BP 149/70 (BP Location: Right Arm)   Pulse (!) 55   Temp 97.6 F (36.4 C) (Oral)   Resp 16   SpO2 97%  CONSTITUTIONAL: Alert and oriented and responds appropriately to questions. Well-appearing; well-nourished. Elderly HEAD: Normocephalic EYES: Conjunctivae clear, PERRL, EOMI ENT: normal nose; no rhinorrhea; moist mucous membranes NECK: Supple, no meningismus, no nuchal rigidity, no LAD  CARD: RRR; S1 and S2 appreciated; no murmurs, no clicks, no rubs, no gallops RESP: Normal chest excursion without splinting or tachypnea; breath sounds clear and equal bilaterally; no wheezes, no rhonchi, no rales, no hypoxia or respiratory distress, speaking full sentences ABD/GI: Normal bowel sounds; non-distended; soft, non-tender, no rebound, no guarding, no peritoneal signs, no hepatosplenomegaly BACK:  The back appears normal and is non-tender to palpation, there is no CVA tenderness EXT: Normal ROM in all joints; non-tender to palpation; no edema; normal capillary refill; no cyanosis, no calf tenderness or swelling    SKIN: Normal color for age and race; warm; no rash NEURO: Moves all extremities equally, sensation to light touch intact diffusely, cranial nerves II through XII intact, normal speech. Strength 5/5 in all four extremities.  Normal gait. PSYCH: The patient's mood and manner are appropriate. Grooming and personal hygiene are appropriate.  MEDICAL DECISION MAKING: Patient here with episode of lightheadedness, elevated blood pressure at home that lasted 15-20 minutes and now gone. Now completely asymptomatic. Labs ordered in triage but  unremarkable including negative troponin, normal hemoglobin and normal electrolytes. Glucose also normal. No focal neurologic deficits. Denies any chest pain, shortness of breath during this episode. No recent infectious symptoms. States his blood pressure was in the 180s/100s at home but has improved and is now 149/70. We will refill his Cozaar before discharge. Plan is to repeat a second troponin, obtain urinalysis and continue to closely monitor patient. He would like discharge home. He can follow-up with his PCP Dr. Felipa Eth this week.  ED PROGRESS: Patient's blood pressure still stable. He still is demented. Second troponin negative. Urine shows no sign of infection, no ketones. Will follow-up with Dr. Felipa Eth this week. Discussed return precautions. We'll give her refill of his Cozaar. Patient verbalizes understanding and is comfortable with this plan.    At this time, I do not feel there is any life-threatening condition present. I have reviewed and discussed all results (EKG, imaging,  lab, urine as appropriate) and exam findings with patient/family. I have reviewed nursing notes and appropriate previous records.  I feel the patient is safe to be discharged home without further emergent workup and can continue workup as an outpatient as needed. Discussed usual and customary return precautions. Patient/family verbalize understanding and are comfortable with this plan.  Outpatient follow-up has been provided. All questions have been answered.    EKG Interpretation  Date/Time:  Monday May 14 2016 20:31:32 EST Ventricular Rate:  55 PR Interval:  246 QRS Duration: 90 QT Interval:  418 QTC Calculation: 399 R Axis:   41 Text Interpretation:  Sinus bradycardia with 1st degree A-V block Cannot rule out Anterior infarct , age undetermined Abnormal ECG No significant change since last tracing Confirmed by Shanesha Bednarz,  DO, Lindsy Cerullo [phone]) on 05/14/2016 11:12:58 PM       I personally performed the  services described in this documentation, which was scribed in my presence. The recorded information has been reviewed and is accurate.    [place], DO 05/15/16 [phone]

## 2016-05-15 DIAGNOSIS — R42 Dizziness and giddiness: Secondary | ICD-10-CM | POA: Diagnosis not present

## 2016-05-15 LAB — URINALYSIS, ROUTINE W REFLEX MICROSCOPIC
Bilirubin Urine: NEGATIVE
GLUCOSE, UA: NEGATIVE mg/dL
Hgb urine dipstick: NEGATIVE
Ketones, ur: NEGATIVE mg/dL
LEUKOCYTES UA: NEGATIVE
NITRITE: NEGATIVE
PH: 6.5 (ref 5.0–8.0)
Protein, ur: NEGATIVE mg/dL
SPECIFIC GRAVITY, URINE: 1.005 (ref 1.005–1.030)

## 2016-05-15 MED ORDER — LOSARTAN POTASSIUM 25 MG PO TABS: 25.0000 mg | ORAL_TABLET | Freq: Every day | ORAL | 1 refills | Status: DC

## 2016-05-15 NOTE — ED Notes (Signed)
[facility] on [address]

## 2016-05-21 IMAGING — CR DG CHEST 1V PORT
1 series · 1 of 1 positions shown · non-contrast
Comparison: Radiograph 04/28/2015

CLINICAL DATA: CABG x2.  Endotracheal tube.

EXAM:
PORTABLE CHEST 1 VIEW

[AP]
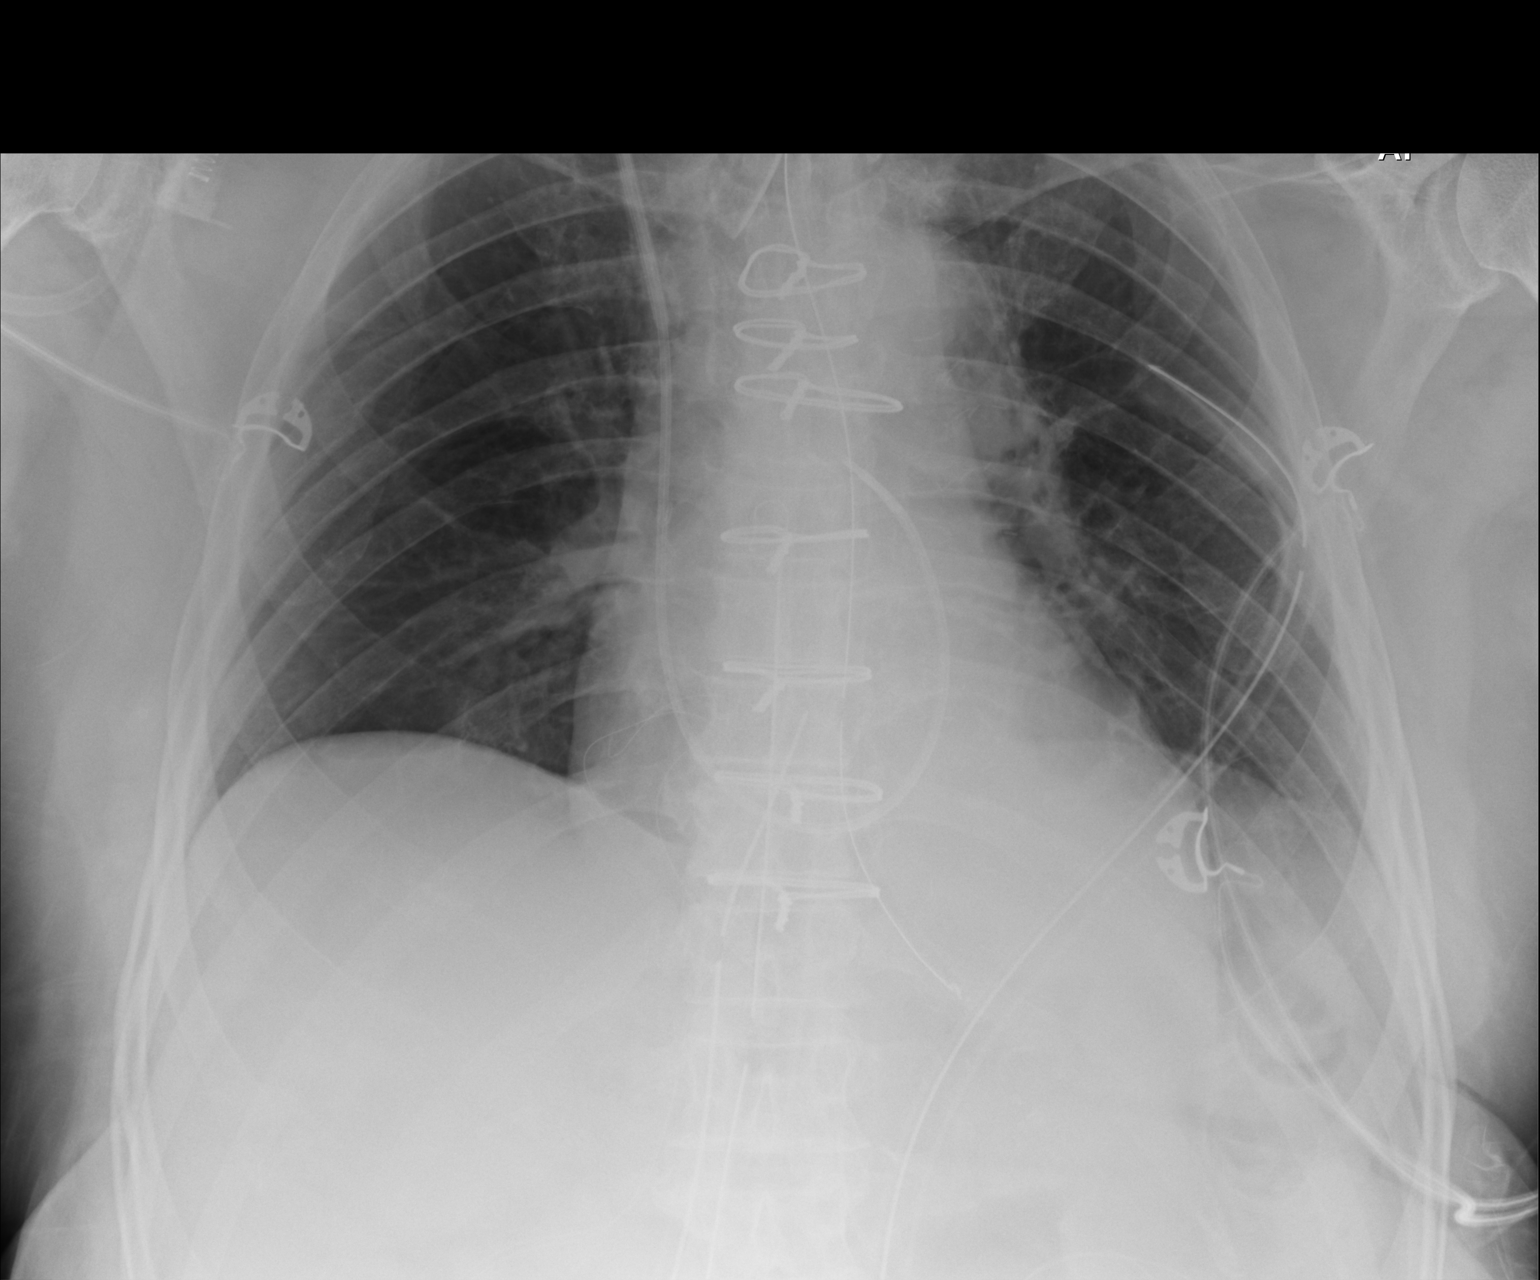

[1 of 1 positions shown; findings below may reference images not displayed]

FINDINGS: Endotracheal tube positioned 5 cm from carina. Midline sternotomy.
Swan-Ganz catheter with tip in the main pulmonary artery. NG tube
with tip at the GE junction. Mediastinal drain and LEFT chest tube
good position. Increased LEFT basilar atelectasis small effusion. No
pneumothorax.
IMPRESSION: 1. Support apparatus in good position following CABG.
2. New LEFT basilar atelectasis small effusion.
3. No pneumothorax.

## 2016-05-23 IMAGING — CR DG CHEST 1V PORT
1 series · 1 of 1 positions shown · non-contrast
Comparison: 04/30/2015

CLINICAL DATA: Atelectasis, CABG

EXAM:
PORTABLE CHEST 1 VIEW

[AP]
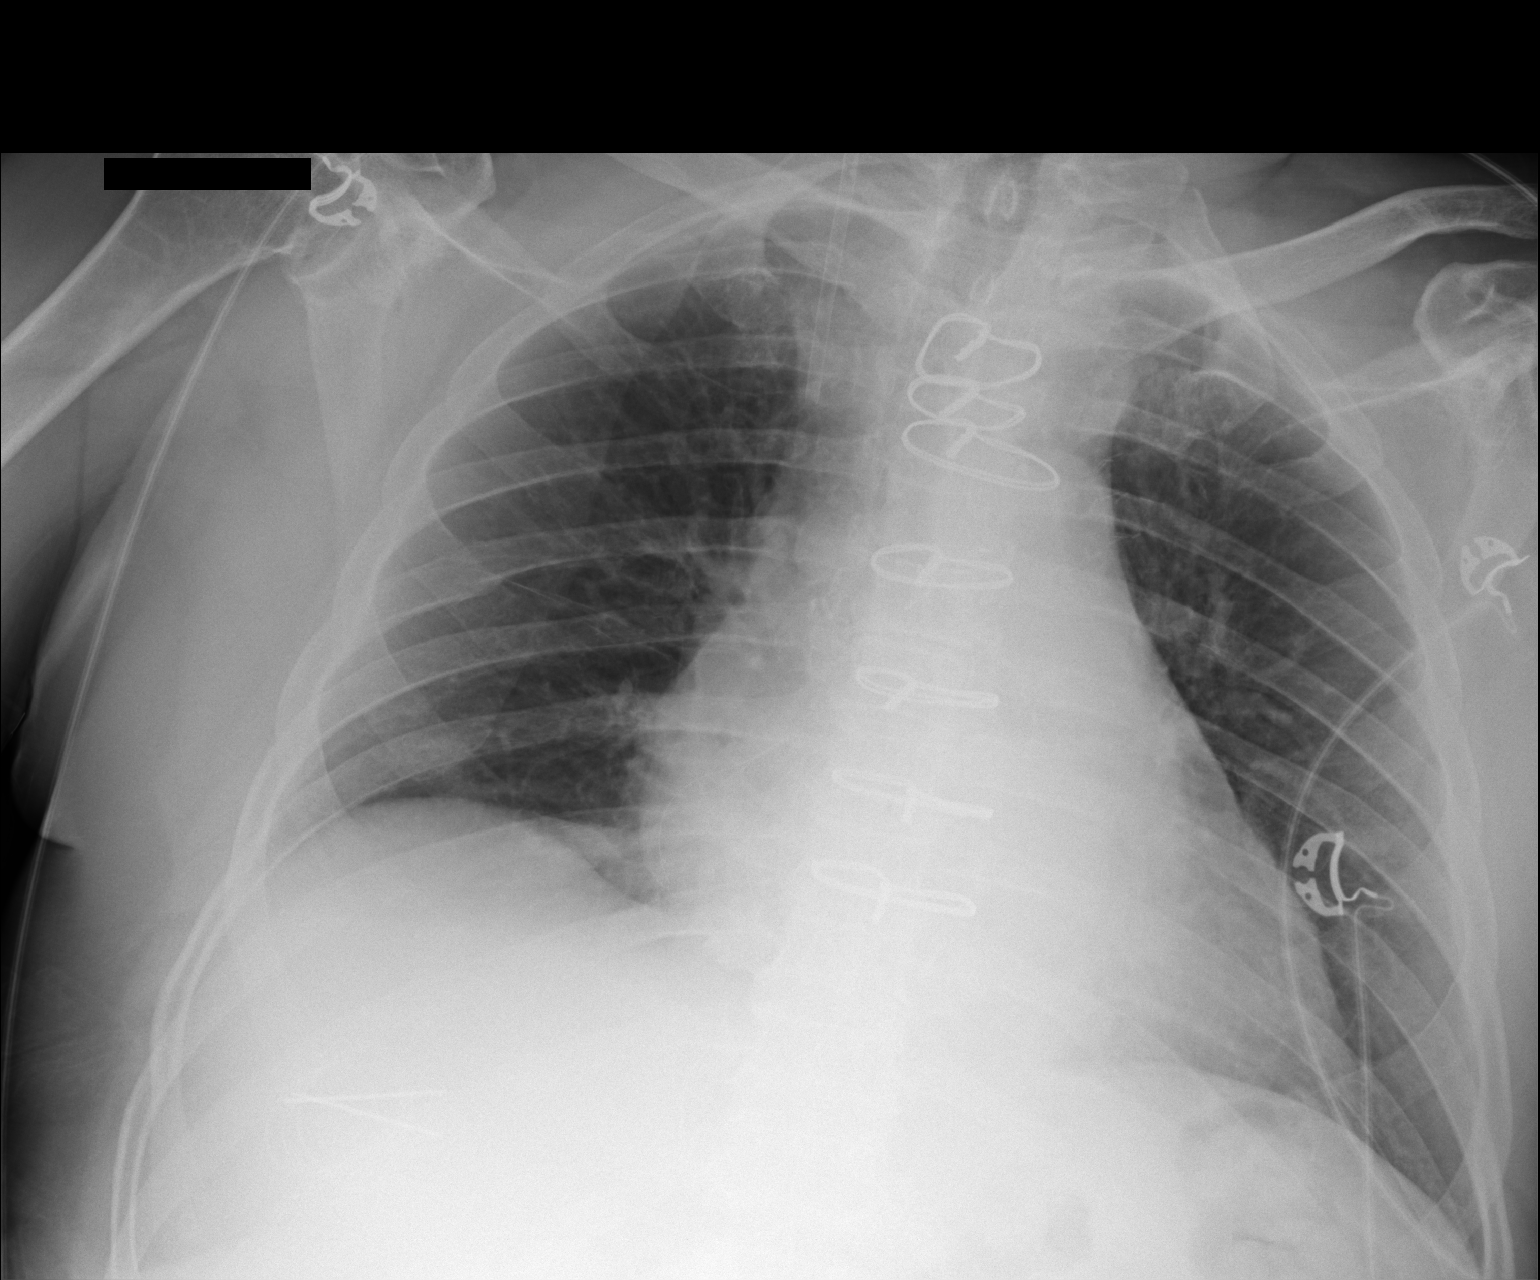

[1 of 1 positions shown; findings below may reference images not displayed]

FINDINGS: Sternotomy wires overlie normal cardiac silhouette. Interval
retraction of the Swan-Ganz catheter. RIGHT IJ sheath remains.
Interval removal of the mediastinal drains and LEFT chest tube. No
pneumothorax. No pulmonary edema. No pleural fluid.
IMPRESSION: 1. Chest tube and mediastinal drain removal without complication.
2. No edema, pneumothorax, or significant atelectasis.

## 2016-05-28 DIAGNOSIS — L089 Local infection of the skin and subcutaneous tissue, unspecified: Secondary | ICD-10-CM | POA: Diagnosis not present

## 2016-05-28 DIAGNOSIS — B9689 Other specified bacterial agents as the cause of diseases classified elsewhere: Secondary | ICD-10-CM | POA: Diagnosis not present

## 2016-08-01 ENCOUNTER — Telehealth: Payer: Self-pay | Admitting: Cardiology

## 2016-08-01 NOTE — Telephone Encounter (Signed)
Pt states his BP is running around [id number] systolic in the AM.  Advised to continue to monitor and c/b if it becomes more elevated over time.  He is aware he is due for f/u in August 2018.

## 2016-08-01 NOTE — Telephone Encounter (Signed)
New message   Pt verbalized that Dr.Stoneking is prescribing his medication for his losartan and he wants   it increased and for Dr.Skains to write him one and to increase the mg

## 2016-08-31 ENCOUNTER — Other Ambulatory Visit: Payer: Self-pay | Admitting: Cardiology

## 2016-08-31 DIAGNOSIS — E7800 Pure hypercholesterolemia, unspecified: Secondary | ICD-10-CM

## 2016-08-31 DIAGNOSIS — Z79899 Other long term (current) drug therapy: Secondary | ICD-10-CM

## 2016-09-19 DIAGNOSIS — H00025 Hordeolum internum left lower eyelid: Secondary | ICD-10-CM | POA: Diagnosis not present

## 2016-10-19 DIAGNOSIS — E782 Mixed hyperlipidemia: Secondary | ICD-10-CM | POA: Diagnosis not present

## 2016-10-19 DIAGNOSIS — I10 Essential (primary) hypertension: Secondary | ICD-10-CM | POA: Diagnosis not present

## 2016-10-19 DIAGNOSIS — E790 Hyperuricemia without signs of inflammatory arthritis and tophaceous disease: Secondary | ICD-10-CM | POA: Diagnosis not present

## 2016-11-09 DIAGNOSIS — R509 Fever, unspecified: Secondary | ICD-10-CM | POA: Diagnosis not present

## 2016-11-09 DIAGNOSIS — I2510 Atherosclerotic heart disease of native coronary artery without angina pectoris: Secondary | ICD-10-CM | POA: Diagnosis not present

## 2016-11-09 DIAGNOSIS — Z8614 Personal history of Methicillin resistant Staphylococcus aureus infection: Secondary | ICD-10-CM | POA: Diagnosis not present

## 2016-11-09 DIAGNOSIS — I10 Essential (primary) hypertension: Secondary | ICD-10-CM | POA: Diagnosis not present

## 2017-03-20 ENCOUNTER — Other Ambulatory Visit: Payer: Self-pay | Admitting: Cardiology

## 2017-03-20 MED ORDER — METOPROLOL TARTRATE 25 MG PO TABS: ORAL_TABLET | ORAL | 1 refills | Status: DC

## 2017-03-22 ENCOUNTER — Telehealth: Payer: Self-pay | Admitting: Cardiology

## 2017-03-22 NOTE — Telephone Encounter (Signed)
Spoke with pt who is reporting having some swelling in his calves and ankles but not feet when he has been up and very active.  [place].  The swelling resolves as soon as he sits down and puts his feet up.  He reports that sometimes if he presses on the swelling it has some "pitting" but returns to normal shortly.   He denies any other s/s - no SOB, no pain, no cough.  Advised to wear knee high compression hose and watch NA intake.  He reports he frequently wears a brace given to him by Dr Noemi Chapel after his last knee surgery and when he does wear it he does not have any swelling in that leg.  Advised I will forward this information to Dr Marlou Porch for review and any further recommendations.  Pt will c/b if further concerns or problems otherwise he will keep his appt as scheduled.

## 2017-03-22 NOTE — Telephone Encounter (Signed)
New Message  Pt c/o swelling: STAT is pt has developed SOB within 24 hours  1. How long have you been experiencing swelling? For a while, but gotten worse. Per pt its a minor pain, but he feels its gradually getting worse.  2. Where is the swelling located? Legs ankles mostly in calf's   3.  Are you currently taking a "fluid pill"?  4.  Are you currently SOB? no  5.  Have you traveled recently? Yes pt out of town now.  Pt would like to know if he would need to make a sooner appt with Dr. Marlou Porch. Please call back to discuss

## 2017-03-28 ENCOUNTER — Other Ambulatory Visit: Payer: Self-pay | Admitting: Cardiology

## 2017-03-28 DIAGNOSIS — E7800 Pure hypercholesterolemia, unspecified: Secondary | ICD-10-CM

## 2017-03-28 DIAGNOSIS — Z79899 Other long term (current) drug therapy: Secondary | ICD-10-CM

## 2017-04-17 DIAGNOSIS — Z23 Encounter for immunization: Secondary | ICD-10-CM | POA: Diagnosis not present

## 2017-05-01 ENCOUNTER — Other Ambulatory Visit: Payer: Self-pay | Admitting: Cardiology

## 2017-05-10 ENCOUNTER — Encounter: Payer: Self-pay | Admitting: Cardiology

## 2017-05-21 ENCOUNTER — Ambulatory Visit (INDEPENDENT_AMBULATORY_CARE_PROVIDER_SITE_OTHER): Payer: Medicare Other | Admitting: Cardiology

## 2017-05-21 ENCOUNTER — Encounter: Payer: Self-pay | Admitting: Cardiology

## 2017-05-21 VITALS — BP 120/70 | HR 62 | Ht 68.0 in | Wt 203.4 lb

## 2017-05-21 DIAGNOSIS — I10 Essential (primary) hypertension: Secondary | ICD-10-CM

## 2017-05-21 DIAGNOSIS — I2510 Atherosclerotic heart disease of native coronary artery without angina pectoris: Secondary | ICD-10-CM

## 2017-05-21 DIAGNOSIS — R002 Palpitations: Secondary | ICD-10-CM | POA: Diagnosis not present

## 2017-05-21 DIAGNOSIS — Z79899 Other long term (current) drug therapy: Secondary | ICD-10-CM | POA: Diagnosis not present

## 2017-05-21 DIAGNOSIS — E7800 Pure hypercholesterolemia, unspecified: Secondary | ICD-10-CM

## 2017-05-21 DIAGNOSIS — Z951 Presence of aortocoronary bypass graft: Secondary | ICD-10-CM

## 2017-05-21 NOTE — Progress Notes (Signed)
Cardiology Office Note    Date:  05/21/2017   ID:  Talan Dennis York, DOB 1941/06/29, MRN [id number]  PCP:  Dennis Manes, MD  Cardiologist:   Dennis Furbish, MD     History of Present Illness:  Dennis York is a 76 y.o. male post CABG 04/27/15, Dr. Cyndia York, prior PCI to LAD in 1994, hypertension, hyperlipidemia, prior alcohol use in remission, nephrolithiasis, here for post bypass follow-up.  I reviewed Dr. Vivi York last note. He was doing well.   Previously had called taking that some achiness was being caused by atorvastatin 40 mg. Apparently we adjusted the dose to 20 mg but now on his medication list is now at 40 mg once again. Continue.  He has been doing very well. [employer]. Does have some left-sided chest wall soreness. 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 He was waterskiing recently, 9 months post bypass. Also has some right sided lower extremity edema. Overall doing well. Pleased. Unfortunately has a granddaughter in [place] who is having difficulties with eating disorder.  05/21/17  - energy level has been a bit low. Edema LE comes and goes. Daughter [name]. Skipped beats pattern noted. If lay flat has them. After a big meal.  He denies any anginal symptoms, no chest pain, no syncope, no bleeding, no orthopnea.   Past Medical History:  Diagnosis Date  . Alcohol abuse, in remission   . Anxiety   . Arthritis    "hands, wrists, ankle on right" (05/01/2012)  . CAD (coronary artery disease)    Prior PCI to LAD in 1994 sees Dr Etter Sjogren  . Complication of anesthesia    allergic reactions to Barbituates  . Depression   . GERD (gastroesophageal reflux disease)   . H/O hiatal hernia   . H/O macrocytic anemia 10/2011   , Mild, Hemoglobin 12.5  . Hiatal hernia   . HTN (hypertension)   . Hyperlipidemia   . Hypocholesterolemia   . Iron deficiency   . Kidney stones    hx of   . Left knee DJD   . Lower GI bleeding    "had colonoscopy; it was internal   hemorrhoids"   . Normal nuclear stress test    04/2012 [facility]  . SIADH (syndrome of inappropriate ADH production) (Dennis York)   . Swelling of both ankles    "if I'm on my feet alot or flying to overseas to see daughter" (05/01/2012)  . Vitamin B 12 deficiency 10/2011   , Start oral replacement    Past Surgical History:  Procedure Laterality Date  . CARDIAC CATHETERIZATION  01/30/1993   EF 55-60%  . CARDIOVASCULAR STRESS TEST    . CATARACT EXTRACTION W/ INTRAOCULAR LENS  IMPLANT, BILATERAL  ~ 2010; 03/2012   right; left  . CORONARY ANGIOPLASTY  1990's  . CORONARY ARTERY BYPASS GRAFTING (CABG) x2 using left internal mammory artery and right saphenous leg vein harvested endoscopically N/A 04/29/2015   Performed by [name], MD at [facility]  . EYE SURGERY     bilateral cataracts removed 2013  . JOINT REPLACEMENT    . KNEE ARTHROSCOPY  ~ 2001   left  . Left Heart Cath and Coronary Angiography N/A 04/22/2015   Performed by Jerline Pain, MD at [facility]  . MINOR HEMORRHOIDECTOMY  ~ 1960  . REPLACEMENT TOTAL KNEE  08/2008; 04/30/2012   right; left  . TONSILLECTOMY  1946  . TOTAL KNEE ARTHROPLASTY Left 04/30/2012   Performed by Percell Miller,  Nestor Ramp, MD at [facility]  . TRANSESOPHAGEAL ECHOCARDIOGRAM (TEE) N/A 04/29/2015   Performed by [name], MD at [facility]  . US ECHOCARDIOGRAPHY  09/04/2005   EF 55-60%    Outpatient Medications Prior to Visit  Medication Sig Dispense Refill  . amLODipine (NORVASC) 10 MG tablet Take 10 mg daily by mouth.    Marland Kitchen aspirin 81 MG tablet Take 1 tablet (81 mg total) by mouth daily.    Marland Kitchen atorvastatin (LIPITOR) 40 MG tablet TAKE 1 TABLET(40 MG) BY MOUTH DAILY AT 6 PM 90 tablet 0  . losartan (COZAAR) 25 MG tablet Take 1 tablet (25 mg total) by mouth daily. 30 tablet 1  . metoprolol tartrate (LOPRESSOR) 25 MG tablet Take 0.5 tablets (12.5 mg total) by mouth 2 (two) times daily. Please keep upcoming appt before anymore refills. Thank you 30 tablet 0    No facility-administered medications prior to visit.      Allergies:   Ativan [lorazepam]; Barbiturates; Bee venom; Penicillins; Morphine and related; Percocet [oxycodone-acetaminophen]; and Vicodin [hydrocodone-acetaminophen]   Social History   Socioeconomic History  . Marital status: Married    Spouse name: None  . Number of children: None  . Years of education: None  . Highest education level: None  Social Needs  . Financial resource strain: None  . Food insecurity - worry: None  . Food insecurity - inability: None  . Transportation needs - medical: None  . Transportation needs - non-medical: None  Occupational History  . None  Tobacco Use  . Smoking status: Former Smoker    Packs/day: 2.00    Years: 30.00    Pack years: 60.00    Types: Cigarettes    Last attempt to quit: 02/28/1995    Years since quitting: 22.2  . Smokeless tobacco: Never Used  Substance and Sexual Activity  . Alcohol use: No    Alcohol/week: 0.0 oz    Comment: 05/01/2012 "totally quit alcohol in ~ 10 months; I was drinking too much beer so I quit"  . Drug use: No  . Sexual activity: Yes  Other Topics Concern  . None  Social History Narrative  . None     Family History:  The patient's family history includes Breast cancer in his mother; Depression in his unknown relative; Heart disease in his father; Hypertension in his unknown relative.   ROS:   Please see the history of present illness.  ROS All other systems reviewed and are negative.   PHYSICAL EXAM:   VS:  BP 120/70   Pulse 62   Ht 5\' 8"  (1.727 m)   Wt 203 lb 6.4 oz (92.3 kg)   SpO2 94%   BMI 30.93 kg/m    GEN: Well nourished, well developed, in no acute distress  HEENT: normal  Neck: no JVD, carotid bruits, or masses Cardiac: RRR; no murmurs, rubs, or gallops, mild lower extremity ankle edema  Respiratory:  clear to auscultation bilaterally, normal work of breathing GI: soft, nontender, nondistended, + BS MS: no deformity or  atrophy  Skin: warm and dry, no rash Neuro:  Alert and Oriented x 3, Strength and sensation are intact Psych: euthymic mood, full affect   Wt Readings from Last 3 Encounters:  05/21/17 203 lb 6.4 oz (92.3 kg)  02/14/16 195 lb 12.8 oz (88.8 kg)  08/08/15 198 lb 12.8 oz (90.2 kg)      Studies/Labs Reviewed:   EKG:  EKG is not ordered today.  05/21/17-sinus rhythm first degree  AV block 236 ms, nonspecific T wave changes  Recent Labs: No results found for requested labs within last 8760 hours.   Lipid Panel    Component Value Date/Time   CHOL 114 (L) 02/14/2016 0952   TRIG 89 02/14/2016 0952   HDL 42 02/14/2016 0952   CHOLHDL 2.7 02/14/2016 0952   VLDL 18 02/14/2016 0952   LDLCALC 54 02/14/2016 0952   LDLDIRECT 136.3 02/27/2011 0841    Additional studies/ records that were reviewed today include:   prior catheterization, records, labs operative report    ASSESSMENT:    1. Coronary artery disease involving native coronary artery of native heart without angina pectoris   2. S/P CABG x 2   3. Pure hypercholesterolemia   4. Long-term use of high-risk medication   5. Palpitations   6. Essential hypertension      PLAN:  In order of problems listed above:  1. Post bypass 2016, aggressive secondary prevention-aspirin81, statin, beta blocker, ACE inhibitor cough changed to losartan 25 mg. Mild chest ache after waterskiing 9 months post bypass, central scar is normal. No instability.  Overall he has been doing quite well.  Did have some decreased energy.  Hemoglobin is 13.6.  TSH 2.1. 2. Hypertension-Under good control. At home usually in the 644I to 347 systolic. Doing well, medicines reviewed, no changes. 3. Coronary artery disease-severe native vessel CAD resulting in bypass surgery on 04/29/15. This was discovered by abnormal stress test.  EF on stress test was 64% -we discussed the implications of bypass, residual effects , need for continued  prevention  efforts. 4. Hyperlipidemia-atorvastatin reduced dose to 40 mg because of body aches/cramps.  LDL 73, HDL 37.  No changes made.  High intensity statin.  ALT 19. 5. Palpitations-taking low-dose beta blocker. Occasionally will have a skipped beat every fourth or fifth beat.  Possibly vaguely mediated.    Medication Adjustments/Labs and Tests Ordered: Current medicines are reviewed at length with the patient today.  Concerns regarding medicines are outlined above.  Medication changes, Labs and Tests ordered today are listed in the Patient Instructions below. Patient Instructions  Medication Instructions:  The current medical regimen is effective;  continue present plan and medications.  Follow-Up: Follow up in 1 year with Dr. Marlou Porch.  You will receive a letter in the mail 2 months before you are due.  Please call us when you receive this letter to schedule your follow up appointment.  If you need a refill on your cardiac medications before your next appointment, please call your pharmacy.  Thank you for choosing [facility]!!          Signed, Dennis Furbish, MD  05/21/2017 8:47 AM    [facility] [place], [place], [place]  [address] Phone: [phone]; Fax: [phone]

## 2017-05-21 NOTE — Patient Instructions (Signed)

## 2017-05-29 NOTE — Addendum Note (Signed)
Addended by: Stephannie Peters on: 05/29/2017 10:04 AM   Modules accepted: Orders

## 2017-06-04 DIAGNOSIS — M25561 Pain in right knee: Secondary | ICD-10-CM | POA: Diagnosis not present

## 2017-06-04 DIAGNOSIS — M25562 Pain in left knee: Secondary | ICD-10-CM | POA: Diagnosis not present

## 2017-07-15 ENCOUNTER — Other Ambulatory Visit: Payer: Self-pay | Admitting: Cardiology

## 2017-08-21 DIAGNOSIS — M79644 Pain in right finger(s): Secondary | ICD-10-CM | POA: Diagnosis not present

## 2017-08-28 DIAGNOSIS — M25571 Pain in right ankle and joints of right foot: Secondary | ICD-10-CM | POA: Diagnosis not present

## 2017-08-30 DIAGNOSIS — M1A0411 Idiopathic chronic gout, right hand, with tophus (tophi): Secondary | ICD-10-CM | POA: Diagnosis not present

## 2017-09-24 ENCOUNTER — Other Ambulatory Visit: Payer: Self-pay | Admitting: Cardiology

## 2017-09-24 DIAGNOSIS — E7800 Pure hypercholesterolemia, unspecified: Secondary | ICD-10-CM

## 2017-09-24 DIAGNOSIS — Z79899 Other long term (current) drug therapy: Secondary | ICD-10-CM

## 2017-10-17 ENCOUNTER — Other Ambulatory Visit: Payer: Self-pay | Admitting: Cardiology

## 2017-10-17 DIAGNOSIS — Z79899 Other long term (current) drug therapy: Secondary | ICD-10-CM

## 2017-10-17 DIAGNOSIS — E7800 Pure hypercholesterolemia, unspecified: Secondary | ICD-10-CM

## 2017-10-17 MED ORDER — ATORVASTATIN CALCIUM 40 MG PO TABS: ORAL_TABLET | ORAL | 0 refills | Status: AC

## 2017-10-17 NOTE — Telephone Encounter (Signed)
Pt's medication was sent to pt's pharmacy as requested. Confirmation received.  °

## 2017-10-17 NOTE — Telephone Encounter (Signed)
°*  STAT* If patient is at the pharmacy, call can be transferred to refill team.   1. Which medications need to be refilled? (please list name of each medication and dose if known)  atorvastatin (LIPITOR) 40 MG tablet TAKE 1 TABLET(40 MG) BY MOUTH DAILY AT 6 PM   2. Which pharmacy/location (including street and city if local pharmacy) is medication to be sent to?  [facility], [place] [phone] (Phone) [phone] (Fax)    3. Do they need a 30 day or 90 day supply? 30 day with refills   Pt is going out of town today

## 2017-12-04 DIAGNOSIS — G43009 Migraine without aura, not intractable, without status migrainosus: Secondary | ICD-10-CM | POA: Diagnosis not present

## 2017-12-04 DIAGNOSIS — I10 Essential (primary) hypertension: Secondary | ICD-10-CM | POA: Diagnosis not present

## 2017-12-04 DIAGNOSIS — R600 Localized edema: Secondary | ICD-10-CM | POA: Diagnosis not present

## 2017-12-09 DIAGNOSIS — Z961 Presence of intraocular lens: Secondary | ICD-10-CM | POA: Diagnosis not present

## 2017-12-09 DIAGNOSIS — H10413 Chronic giant papillary conjunctivitis, bilateral: Secondary | ICD-10-CM | POA: Diagnosis not present

## 2017-12-09 DIAGNOSIS — H04123 Dry eye syndrome of bilateral lacrimal glands: Secondary | ICD-10-CM | POA: Diagnosis not present

## 2017-12-17 DIAGNOSIS — H43811 Vitreous degeneration, right eye: Secondary | ICD-10-CM | POA: Diagnosis not present

## 2017-12-17 DIAGNOSIS — H43391 Other vitreous opacities, right eye: Secondary | ICD-10-CM | POA: Diagnosis not present

## 2018-01-13 DIAGNOSIS — H33021 Retinal detachment with multiple breaks, right eye: Secondary | ICD-10-CM | POA: Diagnosis not present

## 2018-01-13 DIAGNOSIS — Z961 Presence of intraocular lens: Secondary | ICD-10-CM | POA: Diagnosis not present

## 2018-01-13 DIAGNOSIS — H3320 Serous retinal detachment, unspecified eye: Secondary | ICD-10-CM | POA: Diagnosis not present

## 2018-01-13 DIAGNOSIS — H43811 Vitreous degeneration, right eye: Secondary | ICD-10-CM | POA: Diagnosis not present

## 2018-01-13 DIAGNOSIS — H4311 Vitreous hemorrhage, right eye: Secondary | ICD-10-CM | POA: Diagnosis not present

## 2018-01-13 DIAGNOSIS — H43391 Other vitreous opacities, right eye: Secondary | ICD-10-CM | POA: Diagnosis not present

## 2018-01-15 DIAGNOSIS — H33021 Retinal detachment with multiple breaks, right eye: Secondary | ICD-10-CM | POA: Diagnosis not present

## 2018-01-22 DIAGNOSIS — Z09 Encounter for follow-up examination after completed treatment for conditions other than malignant neoplasm: Secondary | ICD-10-CM | POA: Diagnosis not present

## 2018-01-22 DIAGNOSIS — H33021 Retinal detachment with multiple breaks, right eye: Secondary | ICD-10-CM | POA: Diagnosis not present

## 2018-02-05 ENCOUNTER — Other Ambulatory Visit: Payer: Self-pay | Admitting: Cardiology

## 2018-02-05 ENCOUNTER — Telehealth: Payer: Self-pay | Admitting: Cardiology

## 2018-02-05 DIAGNOSIS — H3341 Traction detachment of retina, right eye: Secondary | ICD-10-CM | POA: Diagnosis not present

## 2018-02-05 DIAGNOSIS — H33021 Retinal detachment with multiple breaks, right eye: Secondary | ICD-10-CM | POA: Diagnosis not present

## 2018-02-05 MED ORDER — AMLODIPINE BESYLATE 10 MG PO TABS: 10.0000 mg | ORAL_TABLET | Freq: Every day | ORAL | 0 refills | Status: DC

## 2018-02-05 MED ORDER — AMLODIPINE BESYLATE 10 MG PO TABS: 10.0000 mg | ORAL_TABLET | Freq: Every day | ORAL | 3 refills | Status: DC

## 2018-02-05 NOTE — Addendum Note (Signed)
Addended by: Derl Barrow on: 02/05/2018 09:05 AM   Modules accepted: Orders

## 2018-02-05 NOTE — Telephone Encounter (Signed)
Pt's medication was sent to pt's pharmacy as requested. Confirmation received.  °

## 2018-02-05 NOTE — Telephone Encounter (Signed)
°*  STAT* If patient is at the pharmacy, call can be transferred to refill team.   1. Which medications need to be refilled? (please list name of each medication and dose if known) Amlodipine 10mg    2. Which pharmacy/location (including street and city if local pharmacy) is medication to be sent to?[facility]   3. Do they need a 30 day or 90 day supply? [place]

## 2018-02-07 DIAGNOSIS — H33051 Total retinal detachment, right eye: Secondary | ICD-10-CM | POA: Diagnosis not present

## 2018-02-07 DIAGNOSIS — H3341 Traction detachment of retina, right eye: Secondary | ICD-10-CM | POA: Diagnosis not present

## 2018-02-07 DIAGNOSIS — H33021 Retinal detachment with multiple breaks, right eye: Secondary | ICD-10-CM | POA: Diagnosis not present

## 2018-02-12 DIAGNOSIS — H33021 Retinal detachment with multiple breaks, right eye: Secondary | ICD-10-CM | POA: Diagnosis not present

## 2018-02-12 DIAGNOSIS — H33051 Total retinal detachment, right eye: Secondary | ICD-10-CM | POA: Diagnosis not present

## 2018-02-12 DIAGNOSIS — Z09 Encounter for follow-up examination after completed treatment for conditions other than malignant neoplasm: Secondary | ICD-10-CM | POA: Diagnosis not present

## 2018-03-04 DIAGNOSIS — H33021 Retinal detachment with multiple breaks, right eye: Secondary | ICD-10-CM | POA: Diagnosis not present

## 2018-03-04 DIAGNOSIS — Z09 Encounter for follow-up examination after completed treatment for conditions other than malignant neoplasm: Secondary | ICD-10-CM | POA: Diagnosis not present

## 2018-04-08 DIAGNOSIS — H33021 Retinal detachment with multiple breaks, right eye: Secondary | ICD-10-CM | POA: Diagnosis not present

## 2018-04-08 DIAGNOSIS — H35351 Cystoid macular degeneration, right eye: Secondary | ICD-10-CM | POA: Diagnosis not present

## 2018-04-08 DIAGNOSIS — Z09 Encounter for follow-up examination after completed treatment for conditions other than malignant neoplasm: Secondary | ICD-10-CM | POA: Diagnosis not present

## 2018-04-23 DIAGNOSIS — Z23 Encounter for immunization: Secondary | ICD-10-CM | POA: Diagnosis not present

## 2018-04-23 DIAGNOSIS — I10 Essential (primary) hypertension: Secondary | ICD-10-CM | POA: Diagnosis not present

## 2018-04-23 DIAGNOSIS — Z79899 Other long term (current) drug therapy: Secondary | ICD-10-CM | POA: Diagnosis not present

## 2018-04-23 DIAGNOSIS — L989 Disorder of the skin and subcutaneous tissue, unspecified: Secondary | ICD-10-CM | POA: Diagnosis not present

## 2018-04-23 DIAGNOSIS — R1319 Other dysphagia: Secondary | ICD-10-CM | POA: Diagnosis not present

## 2018-04-23 DIAGNOSIS — R5383 Other fatigue: Secondary | ICD-10-CM | POA: Diagnosis not present

## 2018-05-07 DIAGNOSIS — H35351 Cystoid macular degeneration, right eye: Secondary | ICD-10-CM | POA: Diagnosis not present

## 2018-05-07 DIAGNOSIS — Z09 Encounter for follow-up examination after completed treatment for conditions other than malignant neoplasm: Secondary | ICD-10-CM | POA: Diagnosis not present

## 2018-05-21 DIAGNOSIS — H3341 Traction detachment of retina, right eye: Secondary | ICD-10-CM | POA: Diagnosis not present

## 2018-05-21 DIAGNOSIS — Z978 Presence of other specified devices: Secondary | ICD-10-CM | POA: Diagnosis not present

## 2018-05-21 DIAGNOSIS — H33021 Retinal detachment with multiple breaks, right eye: Secondary | ICD-10-CM | POA: Diagnosis not present

## 2018-05-22 ENCOUNTER — Ambulatory Visit: Payer: Medicare Other | Admitting: Cardiology

## 2018-05-23 ENCOUNTER — Encounter: Payer: Self-pay | Admitting: Cardiology

## 2018-05-23 ENCOUNTER — Ambulatory Visit (INDEPENDENT_AMBULATORY_CARE_PROVIDER_SITE_OTHER): Payer: Medicare Other | Admitting: Cardiology

## 2018-05-23 VITALS — BP 128/68 | HR 56 | Ht 68.0 in | Wt 200.0 lb

## 2018-05-23 DIAGNOSIS — I10 Essential (primary) hypertension: Secondary | ICD-10-CM | POA: Diagnosis not present

## 2018-05-23 DIAGNOSIS — I2510 Atherosclerotic heart disease of native coronary artery without angina pectoris: Secondary | ICD-10-CM | POA: Diagnosis not present

## 2018-05-23 DIAGNOSIS — R002 Palpitations: Secondary | ICD-10-CM | POA: Diagnosis not present

## 2018-05-23 DIAGNOSIS — E7849 Other hyperlipidemia: Secondary | ICD-10-CM | POA: Diagnosis not present

## 2018-05-23 LAB — LIPID PANEL
CHOL/HDL RATIO: 4.1 ratio (ref 0.0–5.0)
Cholesterol, Total: 163 mg/dL (ref 100–199)
HDL: 40 mg/dL (ref >39)
LDL CALC: 105 mg/dL — AB (ref 0–99)
Triglycerides: 92 mg/dL (ref 0–149)
VLDL CHOLESTEROL CAL: 18 mg/dL (ref 5–40)

## 2018-05-23 NOTE — Patient Instructions (Signed)
Medication Instructions:  Your physician recommends that you continue on your current medications as directed. Please refer to the Current Medication list given to you today.  If you need a refill on your cardiac medications before your next appointment, please call your pharmacy.   Lab work: Lipid today  If you have labs (blood work) drawn today and your tests are completely normal, you will receive your results only by: Marland Kitchen MyChart Message (if you have MyChart) OR . A paper copy in the mail If you have any lab test that is abnormal or we need to change your treatment, we will call you to review the results.  Testing/Procedures: None  Follow-Up: At [facility], you and your health needs are our priority.  As part of our continuing mission to provide you with exceptional heart care, we have created designated Provider Care Teams.  These Care Teams include your primary Cardiologist (physician) and Advanced Practice Providers (APPs -  Physician Assistants and Nurse Practitioners) who all work together to provide you with the care you need, when you need it. You will need a follow up appointment in 12 months.  Please call our office 2 months in advance to schedule this appointment.  You may see Candee Furbish, MD or one of the following Advanced Practice Providers on your designated Care Team:   Truitt Merle, NP Cecilie Kicks, NP . Kathyrn Drown, NP  Any Other Special Instructions Will Be Listed Below (If Applicable).

## 2018-05-23 NOTE — Progress Notes (Signed)
Cardiology Office Note:    Date:  05/23/2018   ID:  Dennis York, DOB [birth year], MRN [id number]  PCP:  Lajean Manes, MD  Cardiologist:  Candee Furbish, MD  Electrophysiologist:  None   Referring MD: Lajean Manes, MD     History of Present Illness:    Dennis York is a 77 y.o. male here for follow-up of coronary artery disease.  Had bypass surgery 04/27/2015 by Dr. Cyndia Bent and prior to that had PCI to LAD in 1994.  Previously he was discussing how his energy level had been a bit low.  Lower extremity edema seem to come and go.  Has some skipped beats when laying flat.  Sometimes he gets this after a big meal.  Overall, he had been doing quite well.  Retina detachment - past summer. Challenging.   Past Medical History:  Diagnosis Date  . Alcohol abuse, in remission   . Anxiety   . Arthritis    "hands, wrists, ankle on right" (05/01/2012)  . CAD (coronary artery disease)    Prior PCI to LAD in 1994 sees Dr Etter Sjogren  . Complication of anesthesia    allergic reactions to Barbituates  . Depression   . GERD (gastroesophageal reflux disease)   . H/O hiatal hernia   . H/O macrocytic anemia 10/2011   , Mild, Hemoglobin 12.5  . Hiatal hernia   . HTN (hypertension)   . Hyperlipidemia   . Hypocholesterolemia   . Iron deficiency   . Kidney stones    hx of   . Left knee DJD   . Lower GI bleeding    "had colonoscopy; it was internal  hemorrhoids"   . Normal nuclear stress test    04/2012 [facility]  . SIADH (syndrome of inappropriate ADH production) ([place])   . Swelling of both ankles    "if I'm on my feet alot or flying to [place] to see daughter" (05/01/2012)  . Vitamin B 12 deficiency 10/2011   , Start oral replacement    Past Surgical History:  Procedure Laterality Date  . CARDIAC CATHETERIZATION  01/30/1993   EF 55-60%  . CARDIAC CATHETERIZATION N/A 04/22/2015   Procedure: Left Heart Cath and Coronary Angiography;  Surgeon: Jerline Pain, MD;  Location: [facility];  Service: Cardiovascular;  Laterality: N/A;  . CARDIOVASCULAR STRESS TEST    . CATARACT EXTRACTION W/ INTRAOCULAR LENS  IMPLANT, BILATERAL  ~ 2010; 03/2012   right; left  . CORONARY ANGIOPLASTY  1990's  . CORONARY ARTERY BYPASS GRAFT N/A 04/29/2015   Procedure: CORONARY ARTERY BYPASS GRAFTING (CABG) x2 using left internal mammory artery and right saphenous leg vein harvested endoscopically;  Surgeon: [name], MD;  Location: [facility];  Service: Open Heart Surgery;  Laterality: N/A;  . EYE SURGERY     bilateral cataracts removed 2013  . JOINT REPLACEMENT    . KNEE ARTHROSCOPY  ~ 2001   left  . MINOR HEMORRHOIDECTOMY  ~ 1960  . REPLACEMENT TOTAL KNEE  08/2008; 04/30/2012   right; left  . TEE WITHOUT CARDIOVERSION N/A 04/29/2015   Procedure: TRANSESOPHAGEAL ECHOCARDIOGRAM (TEE);  Surgeon: [name], MD;  Location: [facility];  Service: Open Heart Surgery;  Laterality: N/A;  . TONSILLECTOMY  1946  . TOTAL KNEE ARTHROPLASTY  04/30/2012   Procedure: TOTAL KNEE ARTHROPLASTY;  Surgeon: Ninetta Lights, MD;  Location: [facility];  Service: Orthopedics;  Laterality: Left;  left total knee arthroplasty  . US ECHOCARDIOGRAPHY  09/04/2005  EF 55-60%    Current Medications: Current Meds  Medication Sig  . amLODipine (NORVASC) 10 MG tablet Take 1 tablet (10 mg total) by mouth daily. Please keep upcoming appt in November for future refills. Thank you  . aspirin 81 MG tablet Take 1 tablet (81 mg total) by mouth daily.  Marland Kitchen atorvastatin (LIPITOR) 40 MG tablet TAKE 1 TABLET(40 MG) BY MOUTH DAILY AT 6 PM  . metoprolol tartrate (LOPRESSOR) 25 MG tablet TAKE 1/2 TABLET BY MOUTH TWICE DAILY     Allergies:   Ativan [lorazepam]; Barbiturates; Bee venom; Penicillins; Morphine and related; Percocet [oxycodone-acetaminophen]; and Vicodin [hydrocodone-acetaminophen]   Social History   Socioeconomic History  . Marital status: Married    Spouse name: Not on file  . Number of children: Not on file    . Years of education: Not on file  . Highest education level: Not on file  Occupational History  . Not on file  Social Needs  . Financial resource strain: Not on file  . Food insecurity:    Worry: Not on file    Inability: Not on file  . Transportation needs:    Medical: Not on file    Non-medical: Not on file  Tobacco Use  . Smoking status: Former Smoker    Packs/day: 2.00    Years: 30.00    Pack years: 60.00    Types: Cigarettes    Last attempt to quit: 02/28/1995    Years since quitting: 23.2  . Smokeless tobacco: Never Used  Substance and Sexual Activity  . Alcohol use: No    Comment: 05/01/2012 "totally quit alcohol in ~ 10 months; I was drinking too much beer so I quit"  . Drug use: No  . Sexual activity: Yes  Lifestyle  . Physical activity:    Days per week: Not on file    Minutes per session: Not on file  . Stress: Not on file  Relationships  . Social connections:    Talks on phone: Not on file    Gets together: Not on file    Attends religious service: Not on file    Active member of club or organization: Not on file    Attends meetings of clubs or organizations: Not on file    Relationship status: Not on file  Other Topics Concern  . Not on file  Social History Narrative  . Not on file     Family History: The patient's family history includes Breast cancer in his mother; Depression in his unknown relative; Heart disease in his father; Hypertension in his unknown relative.  ROS:   Please see the history of present illness.    Denies any fevers chills nausea vomiting syncope bleeding all other systems reviewed and are negative.  EKGs/Labs/Other Studies Reviewed:    The following studies were reviewed today: Prior office note, lab work, EKGs  EKG:  EKG is  ordered today.  The ekg ordered today demonstrates 05/23/2018-  sinus bradycardia first-degree AV block 250 ms with prior septal infarct- personally reviewed and interpreted.  Prior 05/21/2017-sinus  rhythm first-degree AV block 236 ms with nonspecific ST-T wave changes.  Recent Labs: No results found for requested labs within last 8760 hours.  Recent Lipid Panel    Component Value Date/Time   CHOL 114 (L) 02/14/2016 0952   TRIG 89 02/14/2016 0952   HDL 42 02/14/2016 0952   CHOLHDL 2.7 02/14/2016 0952   VLDL 18 02/14/2016 0952   LDLCALC 54 02/14/2016 2836  LDLDIRECT 136.3 02/27/2011 0841    Physical Exam:    VS:  BP 128/68   Pulse (!) 56   Ht 5\' 8"  (1.727 m)   Wt 200 lb (90.7 kg)   BMI 30.41 kg/m     Wt Readings from Last 3 Encounters:  05/23/18 200 lb (90.7 kg)  05/21/17 203 lb 6.4 oz (92.3 kg)  02/14/16 195 lb 12.8 oz (88.8 kg)     GEN:  Well nourished, well developed in no acute distress HEENT: Normal NECK: No JVD; No carotid bruits LYMPHATICS: No lymphadenopathy CARDIAC: RRR, no murmurs, rubs, gallops RESPIRATORY:  Clear to auscultation without rales, wheezing or rhonchi  ABDOMEN: Soft, non-tender, non-distended MUSCULOSKELETAL:  trace edema; No deformity  SKIN: Warm and dry NEUROLOGIC:  Alert and oriented x 3 PSYCHIATRIC:  Normal affect   ASSESSMENT:    1. Coronary artery disease involving native coronary artery of native heart without angina pectoris   2. Other hyperlipidemia   3. Essential hypertension   4. Palpitations    PLAN:    In order of problems listed above:  Coronary artery disease status post CABG -Continue with secondary risk factor prevention.  Aspirin, statin therapy, beta-blocker, ACE inhibitor which was changed to losartan because of cough.  Essential hypertension - Currently well controlled.  Usually at home he is in the 110s to 120 range.  Medications all reviewed, no changes.  Hyperlipidemia - Previously, atorvastatin was reduced to 40 mg because of cramping.  We will check a lipid panel today.  Previous ALT on 04/23/2018 performed by Dr. Felipa Eth was 62.  Prior LDL 1 year ago was 71.  Palpitations/first-degree AV  block -Continue with low-dose beta-blocker.  No high risk symptoms such as syncope.  Low-dose metoprolol.   Medication Adjustments/Labs and Tests Ordered: Current medicines are reviewed at length with the patient today.  Concerns regarding medicines are outlined above.  Orders Placed This Encounter  Procedures  . Lipid panel  . EKG 12-Lead   No orders of the defined types were placed in this encounter.   Patient Instructions  Medication Instructions:  Your physician recommends that you continue on your current medications as directed. Please refer to the Current Medication list given to you today.  If you need a refill on your cardiac medications before your next appointment, please call your pharmacy.   Lab work: Lipid today  If you have labs (blood work) drawn today and your tests are completely normal, you will receive your results only by: Marland Kitchen MyChart Message (if you have MyChart) OR . A paper copy in the mail If you have any lab test that is abnormal or we need to change your treatment, we will call you to review the results.  Testing/Procedures: None  Follow-Up: At [facility], you and your health needs are our priority.  As part of our continuing mission to provide you with exceptional heart care, we have created designated Provider Care Teams.  These Care Teams include your primary Cardiologist (physician) and Advanced Practice Providers (APPs -  Physician Assistants and Nurse Practitioners) who all work together to provide you with the care you need, when you need it. You will need a follow up appointment in 12 months.  Please call our office 2 months in advance to schedule this appointment.  You may see Candee Furbish, MD or one of the following Advanced Practice Providers on your designated Care Team:   Truitt Merle, NP Cecilie Kicks, NP . Kathyrn Drown, NP  Any Other  Special Instructions Will Be Listed Below (If Applicable).       Signed, Candee Furbish, MD   05/23/2018 9:53 AM    [facility]

## 2018-05-27 DIAGNOSIS — I10 Essential (primary) hypertension: Secondary | ICD-10-CM | POA: Diagnosis not present

## 2018-05-27 DIAGNOSIS — Z1389 Encounter for screening for other disorder: Secondary | ICD-10-CM | POA: Diagnosis not present

## 2018-05-27 DIAGNOSIS — Z0000 Encounter for general adult medical examination without abnormal findings: Secondary | ICD-10-CM | POA: Diagnosis not present

## 2018-05-27 DIAGNOSIS — I2510 Atherosclerotic heart disease of native coronary artery without angina pectoris: Secondary | ICD-10-CM | POA: Diagnosis not present

## 2018-05-27 DIAGNOSIS — F325 Major depressive disorder, single episode, in full remission: Secondary | ICD-10-CM | POA: Diagnosis not present

## 2018-05-27 DIAGNOSIS — E7800 Pure hypercholesterolemia, unspecified: Secondary | ICD-10-CM | POA: Diagnosis not present

## 2018-05-27 DIAGNOSIS — J301 Allergic rhinitis due to pollen: Secondary | ICD-10-CM | POA: Diagnosis not present

## 2018-06-06 DIAGNOSIS — D171 Benign lipomatous neoplasm of skin and subcutaneous tissue of trunk: Secondary | ICD-10-CM | POA: Diagnosis not present

## 2018-06-06 DIAGNOSIS — H61022 Chronic perichondritis of left external ear: Secondary | ICD-10-CM | POA: Diagnosis not present

## 2018-06-06 DIAGNOSIS — L570 Actinic keratosis: Secondary | ICD-10-CM | POA: Diagnosis not present

## 2018-06-06 DIAGNOSIS — D225 Melanocytic nevi of trunk: Secondary | ICD-10-CM | POA: Diagnosis not present

## 2018-06-06 DIAGNOSIS — L814 Other melanin hyperpigmentation: Secondary | ICD-10-CM | POA: Diagnosis not present

## 2018-06-06 DIAGNOSIS — D2261 Melanocytic nevi of right upper limb, including shoulder: Secondary | ICD-10-CM | POA: Diagnosis not present

## 2018-06-06 DIAGNOSIS — L821 Other seborrheic keratosis: Secondary | ICD-10-CM | POA: Diagnosis not present

## 2018-06-18 DIAGNOSIS — H35351 Cystoid macular degeneration, right eye: Secondary | ICD-10-CM | POA: Diagnosis not present

## 2018-06-18 DIAGNOSIS — Z09 Encounter for follow-up examination after completed treatment for conditions other than malignant neoplasm: Secondary | ICD-10-CM | POA: Diagnosis not present

## 2018-06-30 DIAGNOSIS — H9209 Otalgia, unspecified ear: Secondary | ICD-10-CM | POA: Diagnosis not present

## 2018-06-30 DIAGNOSIS — R1314 Dysphagia, pharyngoesophageal phase: Secondary | ICD-10-CM | POA: Diagnosis not present

## 2018-06-30 DIAGNOSIS — H6983 Other specified disorders of Eustachian tube, bilateral: Secondary | ICD-10-CM | POA: Diagnosis not present

## 2018-07-30 DIAGNOSIS — K449 Diaphragmatic hernia without obstruction or gangrene: Secondary | ICD-10-CM | POA: Diagnosis not present

## 2018-07-30 DIAGNOSIS — H43822 Vitreomacular adhesion, left eye: Secondary | ICD-10-CM | POA: Diagnosis not present

## 2018-07-30 DIAGNOSIS — R1310 Dysphagia, unspecified: Secondary | ICD-10-CM | POA: Diagnosis not present

## 2018-07-30 DIAGNOSIS — K219 Gastro-esophageal reflux disease without esophagitis: Secondary | ICD-10-CM | POA: Diagnosis not present

## 2018-07-30 DIAGNOSIS — H35351 Cystoid macular degeneration, right eye: Secondary | ICD-10-CM | POA: Diagnosis not present

## 2018-08-04 ENCOUNTER — Telehealth: Payer: Self-pay | Admitting: *Deleted

## 2018-08-04 NOTE — Telephone Encounter (Signed)
   [facility] Pre-operative Risk Assessment    Request for surgical clearance:  1. What type of surgery is being performed? EGD   2. When is this surgery scheduled? 09/10/18   3. What type of clearance is required (medical clearance vs. Pharmacy clearance to hold med vs. Both)? MEDICAL  4. Are there any medications that need to be held prior to surgery and how long?NONE LISTED; THOUGH PT IS ON ASA. I LEFT MESSAGE TO SEE IF NEEDING ASA TO BE HELD  5. Practice name and name of physician performing surgery? [facility] DR. HUNG   6. What is your office phone number [phone]    7.   What is your office fax number- [phone]  8.   Anesthesia type (None, local, MAC, general) ? PROPOFOL    Julaine Hua 08/04/2018, 12:33 PM  _________________________________________________________________   (provider comments below)

## 2018-08-06 NOTE — Telephone Encounter (Signed)
Follow Up: ° ° ° ° °Returning [name]'s call from yesterday. °

## 2018-08-07 NOTE — Telephone Encounter (Signed)
° °  Patient returning call regarding clearance status Patient states the procedure date is 2/11 NOT 3/11.

## 2018-08-08 NOTE — Telephone Encounter (Signed)
   Primary Cardiologist: Candee Furbish, MD  Chart reviewed as part of pre-operative protocol coverage. Patient was contacted 08/08/2018 in reference to pre-operative risk assessment for pending surgery as outlined below.  Dennis York was last seen on 05/2018 by Dr. Marlou Porch.  Since that day, Dennis York has done well.  He has had no changes in his medical history and denies any new anginal complaints. His baseline mobility has not changed and he can complete more than 4.0 METS.  Therefore, based on ACC/AHA guidelines, the patient would be at acceptable risk for the planned procedure without further cardiovascular testing.   I will route this recommendation to the requesting party via Epic fax function and remove from pre-op pool.  Pt was also given a hard copy of this letter.  Please call with questions.  Tami Lin Aneyah Lortz, PA 08/08/2018, 12:01 PM

## 2018-08-12 ENCOUNTER — Other Ambulatory Visit: Payer: Self-pay | Admitting: Gastroenterology

## 2018-08-12 ENCOUNTER — Ambulatory Visit
Admission: RE | Admit: 2018-08-12 | Discharge: 2018-08-12 | Disposition: A | Discharge status: Home / Self Care | Payer: Medicare Other | Source: Ambulatory Visit | Attending: Gastroenterology | Admitting: Gastroenterology

## 2018-08-12 DIAGNOSIS — C159 Malignant neoplasm of esophagus, unspecified: Secondary | ICD-10-CM | POA: Diagnosis not present

## 2018-08-12 DIAGNOSIS — R911 Solitary pulmonary nodule: Secondary | ICD-10-CM | POA: Diagnosis not present

## 2018-08-12 DIAGNOSIS — I77810 Thoracic aortic ectasia: Secondary | ICD-10-CM | POA: Diagnosis not present

## 2018-08-12 DIAGNOSIS — C153 Malignant neoplasm of upper third of esophagus: Secondary | ICD-10-CM | POA: Diagnosis not present

## 2018-08-12 DIAGNOSIS — R1310 Dysphagia, unspecified: Secondary | ICD-10-CM | POA: Diagnosis not present

## 2018-08-12 DIAGNOSIS — K229 Disease of esophagus, unspecified: Secondary | ICD-10-CM | POA: Diagnosis not present

## 2018-08-12 DIAGNOSIS — K222 Esophageal obstruction: Secondary | ICD-10-CM | POA: Diagnosis not present

## 2018-08-12 MED ORDER — IOPAMIDOL (ISOVUE-300) INJECTION 61%
120.0000 mL | Freq: Once | INTRAVENOUS | Status: AC | PRN
  Administered 2018-08-12: 120 mL via INTRAVENOUS

## 2018-08-14 ENCOUNTER — Other Ambulatory Visit: Payer: Self-pay

## 2018-08-14 ENCOUNTER — Encounter (HOSPITAL_COMMUNITY): Payer: Self-pay | Admitting: *Deleted

## 2018-08-14 NOTE — Anesthesia Preprocedure Evaluation (Addendum)
Anesthesia Evaluation  Patient identified by MRN, date of birth, ID band Patient awake    Reviewed: Allergy & Precautions, NPO status , Patient's Chart, lab work & pertinent test results, reviewed documented beta blocker date and time   History of Anesthesia Complications (+) history of anesthetic complications  Airway Mallampati: II  TM Distance: >3 FB Neck ROM: Full    Dental  (+) Edentulous Upper, Edentulous Lower   Pulmonary former smoker,    Pulmonary exam normal breath sounds clear to auscultation       Cardiovascular hypertension, Pt. on medications and Pt. on home beta blockers + CAD, + Cardiac Stents and + CABG  Normal cardiovascular exam Rhythm:Regular Rate:Normal  S/P CABG 2v    Neuro/Psych PSYCHIATRIC DISORDERS Anxiety Depression negative neurological ROS     GI/Hepatic hiatal hernia, GERD  Medicated and Controlled,(+)     substance abuse  alcohol use, Hx/o chronic alcoholism in remission Esophageal mass   Endo/Other  Hyperlipidemia  Renal/GU negative Renal ROSHx/o renal calculi  negative genitourinary   Musculoskeletal  (+) Arthritis , Osteoarthritis,    Abdominal   Peds  Hematology  (+) anemia ,   Anesthesia Other Findings   Reproductive/Obstetrics                           Anesthesia Physical Anesthesia Plan  ASA: III  Anesthesia Plan: MAC   Post-op Pain Management:    Induction: Intravenous  PONV Risk Score and Plan: Ondansetron, Propofol infusion and Treatment may vary due to age or medical condition  Airway Management Planned: Natural Airway and Nasal Cannula  Additional Equipment:   Intra-op Plan:   Post-operative Plan:   Informed Consent: I have reviewed the patients History and Physical, chart, labs and discussed the procedure including the risks, benefits and alternatives for the proposed anesthesia with the patient or authorized representative who  has indicated his/her understanding and acceptance.     Dental advisory given  Plan Discussed with: CRNA and Surgeon  Anesthesia Plan Comments:        Anesthesia Quick Evaluation

## 2018-08-15 ENCOUNTER — Ambulatory Visit (HOSPITAL_COMMUNITY): Payer: Medicare Other | Admitting: Anesthesiology

## 2018-08-15 ENCOUNTER — Encounter (HOSPITAL_COMMUNITY): Payer: Self-pay | Admitting: *Deleted

## 2018-08-15 ENCOUNTER — Encounter (HOSPITAL_COMMUNITY)
Admission: RE | Disposition: A | Discharge status: Home / Self Care | Payer: Self-pay | Source: Home / Self Care | Attending: Gastroenterology

## 2018-08-15 ENCOUNTER — Ambulatory Visit (HOSPITAL_COMMUNITY)
Admission: RE | Admit: 2018-08-15 | Discharge: 2018-08-15 | Disposition: A | Discharge status: Home / Self Care | Payer: Medicare Other | Attending: Gastroenterology | Admitting: Gastroenterology

## 2018-08-15 DIAGNOSIS — Z955 Presence of coronary angioplasty implant and graft: Secondary | ICD-10-CM | POA: Diagnosis not present

## 2018-08-15 DIAGNOSIS — Z79899 Other long term (current) drug therapy: Secondary | ICD-10-CM | POA: Diagnosis not present

## 2018-08-15 DIAGNOSIS — E785 Hyperlipidemia, unspecified: Secondary | ICD-10-CM | POA: Diagnosis not present

## 2018-08-15 DIAGNOSIS — Z7982 Long term (current) use of aspirin: Secondary | ICD-10-CM | POA: Diagnosis not present

## 2018-08-15 DIAGNOSIS — C158 Malignant neoplasm of overlapping sites of esophagus: Secondary | ICD-10-CM | POA: Insufficient documentation

## 2018-08-15 DIAGNOSIS — K219 Gastro-esophageal reflux disease without esophagitis: Secondary | ICD-10-CM | POA: Insufficient documentation

## 2018-08-15 DIAGNOSIS — I10 Essential (primary) hypertension: Secondary | ICD-10-CM | POA: Diagnosis not present

## 2018-08-15 DIAGNOSIS — K228 Other specified diseases of esophagus: Secondary | ICD-10-CM | POA: Diagnosis present

## 2018-08-15 DIAGNOSIS — Z96653 Presence of artificial knee joint, bilateral: Secondary | ICD-10-CM | POA: Diagnosis not present

## 2018-08-15 DIAGNOSIS — E222 Syndrome of inappropriate secretion of antidiuretic hormone: Secondary | ICD-10-CM | POA: Insufficient documentation

## 2018-08-15 DIAGNOSIS — E7800 Pure hypercholesterolemia, unspecified: Secondary | ICD-10-CM | POA: Insufficient documentation

## 2018-08-15 DIAGNOSIS — K229 Disease of esophagus, unspecified: Secondary | ICD-10-CM | POA: Diagnosis not present

## 2018-08-15 DIAGNOSIS — Z951 Presence of aortocoronary bypass graft: Secondary | ICD-10-CM | POA: Insufficient documentation

## 2018-08-15 DIAGNOSIS — K449 Diaphragmatic hernia without obstruction or gangrene: Secondary | ICD-10-CM | POA: Insufficient documentation

## 2018-08-15 DIAGNOSIS — R1310 Dysphagia, unspecified: Secondary | ICD-10-CM | POA: Diagnosis not present

## 2018-08-15 DIAGNOSIS — I2510 Atherosclerotic heart disease of native coronary artery without angina pectoris: Secondary | ICD-10-CM | POA: Insufficient documentation

## 2018-08-15 DIAGNOSIS — Z87891 Personal history of nicotine dependence: Secondary | ICD-10-CM | POA: Diagnosis not present

## 2018-08-15 HISTORY — DX: Personal history of urinary calculi: Z87.442

## 2018-08-15 HISTORY — PX: ESOPHAGOGASTRODUODENOSCOPY (EGD) WITH PROPOFOL: SHX5813

## 2018-08-15 HISTORY — DX: Anemia, unspecified: D64.9

## 2018-08-15 SURGERY — ESOPHAGOGASTRODUODENOSCOPY (EGD) WITH PROPOFOL
Anesthesia: Monitor Anesthesia Care

## 2018-08-15 MED ORDER — LACTATED RINGERS IV SOLN
INTRAVENOUS | Status: DC
  Administered 2018-08-15: 08:00:00 via INTRAVENOUS

## 2018-08-15 MED ORDER — SODIUM CHLORIDE 0.9 % IV SOLN: INTRAVENOUS | Status: DC

## 2018-08-15 MED ORDER — PROPOFOL 10 MG/ML IV BOLUS
INTRAVENOUS | Status: DC | PRN
  Administered 2018-08-15: 20 mg via INTRAVENOUS

## 2018-08-15 MED ORDER — ONDANSETRON HCL 4 MG/2ML IJ SOLN: 4.0000 mg | Freq: Once | INTRAMUSCULAR | Status: DC | PRN

## 2018-08-15 MED ORDER — PROPOFOL 500 MG/50ML IV EMUL
INTRAVENOUS | Status: DC | PRN
  Administered 2018-08-15: 100 ug/kg/min via INTRAVENOUS

## 2018-08-15 SURGICAL SUPPLY — 14 items

## 2018-08-15 NOTE — Discharge Instructions (Signed)

## 2018-08-15 NOTE — Anesthesia Postprocedure Evaluation (Signed)
Anesthesia Post Note  Patient: Dennis York  Procedure(s) Performed: ESOPHAGOGASTRODUODENOSCOPY (EGD) WITH PROPOFOL (N/A )     Patient location during evaluation: PACU Anesthesia Type: MAC Level of consciousness: awake and alert and oriented Pain management: pain level controlled Vital Signs Assessment: post-procedure vital signs reviewed and stable Respiratory status: spontaneous breathing, nonlabored ventilation and respiratory function stable Cardiovascular status: stable and blood pressure returned to baseline Postop Assessment: no apparent nausea or vomiting Anesthetic complications: no    Last Vitals:  Vitals:   08/15/18 0910 08/15/18 0915  BP: (!) 97/59 (!) 91/59  Pulse: 73 70  Resp: 17 (!) 22  Temp:    SpO2: 96% 96%    Last Pain:  Vitals:   08/15/18 0915  TempSrc:   PainSc: 0-No pain                 Orli Degrave A.

## 2018-08-15 NOTE — Transfer of Care (Signed)
Immediate Anesthesia Transfer of Care Note  Patient: Dennis York  Procedure(s) Performed: ESOPHAGOGASTRODUODENOSCOPY (EGD) WITH PROPOFOL (N/A )  Patient Location: Endoscopy Unit  Anesthesia Type:MAC  Level of Consciousness: drowsy and patient cooperative  Airway & Oxygen Therapy: Patient Spontanous Breathing  Post-op Assessment: Report given to RN and Post -op Vital signs reviewed and stable  Post vital signs: Reviewed and stable  Last Vitals:  Vitals Value Taken Time  BP 97/59 08/15/2018  9:08 AM  Temp 36.7 C 08/15/2018  9:08 AM  Pulse 67 08/15/2018  9:10 AM  Resp 17 08/15/2018  9:10 AM  SpO2 94 % 08/15/2018  9:10 AM  Vitals shown include unvalidated device data.  Last Pain:  Vitals:   08/15/18 0908  TempSrc: Axillary  PainSc: 0-No pain         Complications: No apparent anesthesia complications

## 2018-08-15 NOTE — Op Note (Signed)
[facility] Patient Name: Dennis York Procedure Date : 08/15/2018 MRN: [id number] Attending MD: Carol Ada , MD Date of Birth: 10/08/1940 CSN: [id number] Age: 78 Admit Type: Outpatient Procedure:                Upper GI endoscopy Indications:              Dysphagia Providers:                Carol Ada, MD, Vista Lawman, RN, Dorise Hiss,                            RN, William Dalton, Technician Referring MD:              Medicines:                Propofol per Anesthesia Complications:            No immediate complications. Estimated Blood Loss:     Estimated blood loss: none. Procedure:                Pre-Anesthesia Assessment:                           - Prior to the procedure, a History and Physical                            was performed, and patient medications and                            allergies were reviewed. The patient's tolerance of                            previous anesthesia was also reviewed. The risks                            and benefits of the procedure and the sedation                            options and risks were discussed with the patient.                            All questions were answered, and informed consent                            was obtained. Prior Anticoagulants: The patient has                            taken no previous anticoagulant or antiplatelet                            agents. ASA Grade Assessment: III - A patient with                            severe systemic disease. After reviewing the risks  and benefits, the patient was deemed in                            satisfactory condition to undergo the procedure.                           - Sedation was administered by an anesthesia                            professional. Deep sedation was attained.                           After obtaining informed consent, the endoscope was                            passed under direct vision.  Throughout the                            procedure, the patient's blood pressure, pulse, and                            oxygen saturations were monitored continuously. The                            [id number]-[id number] ([id number]) Olympus Ultraslim gastroscope                            was introduced through the mouth, and advanced to                            the second part of duodenum. The upper GI endoscopy                            was accomplished without difficulty. The patient                            tolerated the procedure well. Scope In: Scope Out: Findings:      A 4 cm hiatal hernia was present.      A large, fungating mass with no bleeding and no stigmata of recent       bleeding was found in the upper third of the esophagus and in the middle       third of the esophagus, 20 cm from the incisors. The mass was partially       obstructing and circumferential.      The stomach was normal.      The examined duodenum was normal.      The pediatric endoscope was able to pass through the malignant stricture       with relative ease. The proximal edge was at 20 cm and the distal edge       was at 30 cm. Distal to this point the esophageal mucosa was normal and       the Z-line was sharp. Impression:               - 4 cm hiatal hernia.                           -  Partially obstructing, malignant esophageal tumor                            was found in the upper third of the esophagus and                            in the middle third of the esophagus.                           - Normal stomach.                           - Normal examined duodenum.                           - No specimens collected. Recommendation:           - Patient has a contact number available for                            emergencies. The signs and symptoms of potential                            delayed complications were discussed with the                            patient. Return to normal activities  tomorrow.                            Written discharge instructions were provided to the                            patient.                           - Resume previous diet.                           - Continue present medications.                           - Await biopsy results from earlier this week.                           Oncology referral. Procedure Code(s):        --- Professional ---                           [phone], Esophagogastroduodenoscopy, flexible,                            transoral; diagnostic, including collection of                            specimen(s) by brushing or washing, when performed                            (  separate procedure) Diagnosis Code(s):        --- Professional ---                           K44.9, Diaphragmatic hernia without obstruction or                            gangrene                           C15.3, Malignant neoplasm of upper third of                            esophagus                           C15.4, Malignant neoplasm of middle third of                            esophagus                           R13.10, Dysphagia, unspecified CPT copyright 2018 American Medical Association. All rights reserved. The codes documented in this report are preliminary and upon coder review may  be revised to meet current compliance requirements. Carol Ada, MD Carol Ada, MD 08/15/2018 9:08:43 AM This report has been signed electronically. Number of Addenda: 0

## 2018-08-15 NOTE — H&P (Signed)
Dennis York HPI: The patient underwent an EGD on 08/12/2018 for complaints of dysphagia.  A large obstructing mass was encountered in the upper esophagus.  The standard adult endoscope was not able to pass through the area.  He is here today for an attempt to navigate through the malignant stricture with the pediatric endoscope.  Past Medical History:  Diagnosis Date  . Alcohol abuse, in remission   . Anemia   . Anxiety   . Arthritis    "hands, wrists, ankle on right" (05/01/2012)  . CAD (coronary artery disease)    Prior PCI to LAD in 1994 sees Dr Etter Sjogren  . Complication of anesthesia    allergic reactions to Barbituates  . Depression   . GERD (gastroesophageal reflux disease)   . H/O hiatal hernia   . H/O macrocytic anemia 10/2011   , Mild, Hemoglobin 12.5  . Hiatal hernia   . History of kidney stones    Passed   . HTN (hypertension)   . Hyperlipidemia   . Hypocholesterolemia   . Iron deficiency   . Left knee DJD   . Lower GI bleeding    "had colonoscopy; it was internal  hemorrhoids"   . Normal nuclear stress test    04/2012 [facility]  . SIADH (syndrome of inappropriate ADH production) ([place])   . Swelling of both ankles    "if I'm on my feet alot or flying to [place] to see daughter" (05/01/2012)  . Vitamin B 12 deficiency 10/2011   , Start oral replacement    Past Surgical History:  Procedure Laterality Date  . CARDIAC CATHETERIZATION  01/30/1993   EF 55-60%  . CARDIAC CATHETERIZATION N/A 04/22/2015   Procedure: Left Heart Cath and Coronary Angiography;  Surgeon: Jerline Pain, MD;  Location: [place] [facility];  Service: Cardiovascular;  Laterality: N/A;  . CARDIOVASCULAR STRESS TEST    . CATARACT EXTRACTION W/ INTRAOCULAR LENS  IMPLANT, BILATERAL  ~ 2010; 03/2012   right; left  . COLONOSCOPY    . CORONARY ANGIOPLASTY  1990's  . CORONARY ARTERY BYPASS GRAFT N/A 04/29/2015   Procedure: CORONARY ARTERY BYPASS GRAFTING (CABG) x2 using left internal mammory  artery and right saphenous leg vein harvested endoscopically;  Surgeon: [name], MD;  Location: [place] OR;  Service: Open Heart Surgery;  Laterality: N/A;  . EYE SURGERY     bilateral cataracts removed 2013  . JOINT REPLACEMENT    . KNEE ARTHROSCOPY  ~ 2001   left  . MINOR HEMORRHOIDECTOMY  ~ 1960  . REPLACEMENT TOTAL KNEE  08/2008    left  . TEE WITHOUT CARDIOVERSION N/A 04/29/2015   Procedure: TRANSESOPHAGEAL ECHOCARDIOGRAM (TEE);  Surgeon: [name], MD;  Location: [place];  Service: Open Heart Surgery;  Laterality: N/A;  . TONSILLECTOMY  1946  . TOTAL KNEE ARTHROPLASTY  04/30/2012   Procedure: RightTOTAL KNEE ARTHROPLASTY;  Surgeon: Ninetta Lights, MD;  Location: [place];  Service: Orthopedics;  Laterality: Left;  left total knee arthroplasty  . US ECHOCARDIOGRAPHY  09/04/2005   EF 55-60%    Family History  Problem Relation Age of Onset  . Breast cancer Mother   . Heart disease Father   . Hypertension Other   . Depression Other     Social History:  reports that he quit smoking about 23 years ago. His smoking use included cigarettes. He has a 60.00 pack-year smoking history. He has never used smokeless tobacco. He reports that he does not drink alcohol  or use drugs.  Allergies:  Allergies  Allergen Reactions  . Ativan [Lorazepam] Other (See Comments)    ATAXIA LASTING FOR SEVERAL DAYS  . Barbiturates Swelling and Other (See Comments)    "skin on head of penis came off; raw meat"  . Bee Venom Anaphylaxis    Patient carries an epi pen  . Penicillins Anaphylaxis    Did it involve swelling of the face/tongue/throat, SOB, or low BP? Yes Did it involve sudden or severe rash/hives, skin peeling, or any reaction on the inside of your mouth or nose? No Did you need to seek medical attention at a hospital or doctor's office? Yes When did it last happen? At [place] If all above answers are "NO", may proceed with cephalosporin use.   Marland Kitchen Morphine And Related Itching  .  Percocet [Oxycodone-Acetaminophen] Itching  . Vicodin [Hydrocodone-Acetaminophen] Itching    Medications:  Scheduled:  Continuous: . lactated ringers 20 mL/hr at 08/15/18 0735    No results found for this or any previous visit (from the past 24 hour(s)).   No results found.  ROS:  As stated above in the HPI otherwise negative.  Blood pressure 132/68, pulse 70, temperature 98.9 F (37.2 C), temperature source Oral, resp. rate 18, height 5\' 8"  (1.727 m), weight 84.4 kg, SpO2 96 %.    PE: Gen: NAD, Alert and Oriented HEENT:  [place]/AT, EOMI Neck: Supple, no LAD Lungs: CTA Bilaterally CV: RRR without M/G/R ABM: Soft, NTND, +BS Ext: No C/C/E  Assessment/Plan: 1) Upper esophageal mass - EGD.  Kodah Maret D 08/15/2018, 7:48 AM

## 2018-08-17 ENCOUNTER — Encounter (HOSPITAL_COMMUNITY): Payer: Self-pay | Admitting: Gastroenterology

## 2018-08-27 DIAGNOSIS — Z7982 Long term (current) use of aspirin: Secondary | ICD-10-CM | POA: Diagnosis not present

## 2018-08-27 DIAGNOSIS — E222 Syndrome of inappropriate secretion of antidiuretic hormone: Secondary | ICD-10-CM | POA: Diagnosis not present

## 2018-08-27 DIAGNOSIS — K228 Other specified diseases of esophagus: Secondary | ICD-10-CM | POA: Diagnosis not present

## 2018-08-27 DIAGNOSIS — Z951 Presence of aortocoronary bypass graft: Secondary | ICD-10-CM | POA: Diagnosis not present

## 2018-08-27 DIAGNOSIS — I898 Other specified noninfective disorders of lymphatic vessels and lymph nodes: Secondary | ICD-10-CM | POA: Diagnosis not present

## 2018-08-27 DIAGNOSIS — C159 Malignant neoplasm of esophagus, unspecified: Secondary | ICD-10-CM | POA: Diagnosis not present

## 2018-08-27 DIAGNOSIS — C153 Malignant neoplasm of upper third of esophagus: Secondary | ICD-10-CM | POA: Diagnosis not present

## 2018-08-27 DIAGNOSIS — Z79899 Other long term (current) drug therapy: Secondary | ICD-10-CM | POA: Diagnosis not present

## 2018-08-27 DIAGNOSIS — I2510 Atherosclerotic heart disease of native coronary artery without angina pectoris: Secondary | ICD-10-CM | POA: Diagnosis not present

## 2018-08-27 DIAGNOSIS — Z87891 Personal history of nicotine dependence: Secondary | ICD-10-CM | POA: Diagnosis not present

## 2018-08-27 DIAGNOSIS — I899 Noninfective disorder of lymphatic vessels and lymph nodes, unspecified: Secondary | ICD-10-CM | POA: Diagnosis not present

## 2018-08-27 DIAGNOSIS — R933 Abnormal findings on diagnostic imaging of other parts of digestive tract: Secondary | ICD-10-CM | POA: Diagnosis not present

## 2018-08-27 DIAGNOSIS — I10 Essential (primary) hypertension: Secondary | ICD-10-CM | POA: Diagnosis not present

## 2018-08-27 DIAGNOSIS — K219 Gastro-esophageal reflux disease without esophagitis: Secondary | ICD-10-CM | POA: Diagnosis not present

## 2018-08-28 DIAGNOSIS — C779 Secondary and unspecified malignant neoplasm of lymph node, unspecified: Secondary | ICD-10-CM | POA: Diagnosis not present

## 2018-08-28 DIAGNOSIS — C153 Malignant neoplasm of upper third of esophagus: Secondary | ICD-10-CM | POA: Diagnosis not present

## 2018-08-28 DIAGNOSIS — C154 Malignant neoplasm of middle third of esophagus: Secondary | ICD-10-CM | POA: Diagnosis not present

## 2018-08-28 DIAGNOSIS — Z87891 Personal history of nicotine dependence: Secondary | ICD-10-CM | POA: Diagnosis not present

## 2018-08-28 DIAGNOSIS — C159 Malignant neoplasm of esophagus, unspecified: Secondary | ICD-10-CM | POA: Diagnosis not present

## 2018-08-28 DIAGNOSIS — K229 Disease of esophagus, unspecified: Secondary | ICD-10-CM | POA: Diagnosis not present

## 2018-09-04 DIAGNOSIS — Z79899 Other long term (current) drug therapy: Secondary | ICD-10-CM | POA: Diagnosis not present

## 2018-09-04 DIAGNOSIS — C153 Malignant neoplasm of upper third of esophagus: Secondary | ICD-10-CM | POA: Diagnosis not present

## 2018-09-09 DIAGNOSIS — C153 Malignant neoplasm of upper third of esophagus: Secondary | ICD-10-CM | POA: Diagnosis not present

## 2018-09-09 DIAGNOSIS — K635 Polyp of colon: Secondary | ICD-10-CM | POA: Diagnosis not present

## 2018-09-09 DIAGNOSIS — K621 Rectal polyp: Secondary | ICD-10-CM | POA: Diagnosis not present

## 2018-09-10 DIAGNOSIS — H35351 Cystoid macular degeneration, right eye: Secondary | ICD-10-CM | POA: Diagnosis not present

## 2018-09-10 DIAGNOSIS — H3341 Traction detachment of retina, right eye: Secondary | ICD-10-CM | POA: Diagnosis not present

## 2018-09-10 DIAGNOSIS — H43822 Vitreomacular adhesion, left eye: Secondary | ICD-10-CM | POA: Diagnosis not present

## 2018-09-10 DIAGNOSIS — H33021 Retinal detachment with multiple breaks, right eye: Secondary | ICD-10-CM | POA: Diagnosis not present

## 2018-09-15 ENCOUNTER — Other Ambulatory Visit: Payer: Self-pay | Admitting: Cardiology

## 2018-09-15 DIAGNOSIS — C153 Malignant neoplasm of upper third of esophagus: Secondary | ICD-10-CM | POA: Diagnosis not present

## 2018-09-18 DIAGNOSIS — R634 Abnormal weight loss: Secondary | ICD-10-CM | POA: Diagnosis not present

## 2018-09-18 DIAGNOSIS — Z5111 Encounter for antineoplastic chemotherapy: Secondary | ICD-10-CM | POA: Diagnosis not present

## 2018-09-18 DIAGNOSIS — T451X5A Adverse effect of antineoplastic and immunosuppressive drugs, initial encounter: Secondary | ICD-10-CM | POA: Diagnosis not present

## 2018-09-18 DIAGNOSIS — C153 Malignant neoplasm of upper third of esophagus: Secondary | ICD-10-CM | POA: Diagnosis not present

## 2018-09-18 DIAGNOSIS — R1310 Dysphagia, unspecified: Secondary | ICD-10-CM | POA: Diagnosis not present

## 2018-09-18 DIAGNOSIS — Z87891 Personal history of nicotine dependence: Secondary | ICD-10-CM | POA: Diagnosis not present

## 2018-09-18 DIAGNOSIS — Z510 Encounter for antineoplastic radiation therapy: Secondary | ICD-10-CM | POA: Diagnosis not present

## 2018-09-18 DIAGNOSIS — J392 Other diseases of pharynx: Secondary | ICD-10-CM | POA: Diagnosis not present

## 2018-09-19 DIAGNOSIS — C153 Malignant neoplasm of upper third of esophagus: Secondary | ICD-10-CM | POA: Diagnosis not present

## 2018-09-22 DIAGNOSIS — C153 Malignant neoplasm of upper third of esophagus: Secondary | ICD-10-CM | POA: Diagnosis not present

## 2018-09-23 DIAGNOSIS — C153 Malignant neoplasm of upper third of esophagus: Secondary | ICD-10-CM | POA: Diagnosis not present

## 2018-09-24 DIAGNOSIS — C153 Malignant neoplasm of upper third of esophagus: Secondary | ICD-10-CM | POA: Diagnosis not present

## 2018-09-25 DIAGNOSIS — J029 Acute pharyngitis, unspecified: Secondary | ICD-10-CM | POA: Diagnosis not present

## 2018-09-25 DIAGNOSIS — Z5111 Encounter for antineoplastic chemotherapy: Secondary | ICD-10-CM | POA: Diagnosis not present

## 2018-09-25 DIAGNOSIS — C153 Malignant neoplasm of upper third of esophagus: Secondary | ICD-10-CM | POA: Diagnosis not present

## 2018-09-25 DIAGNOSIS — Z923 Personal history of irradiation: Secondary | ICD-10-CM | POA: Diagnosis not present

## 2018-09-25 DIAGNOSIS — Z87891 Personal history of nicotine dependence: Secondary | ICD-10-CM | POA: Diagnosis not present

## 2018-09-26 DIAGNOSIS — C153 Malignant neoplasm of upper third of esophagus: Secondary | ICD-10-CM | POA: Diagnosis not present

## 2018-09-29 DIAGNOSIS — C153 Malignant neoplasm of upper third of esophagus: Secondary | ICD-10-CM | POA: Diagnosis not present

## 2018-09-30 DIAGNOSIS — C153 Malignant neoplasm of upper third of esophagus: Secondary | ICD-10-CM | POA: Diagnosis not present

## 2018-10-01 DIAGNOSIS — C153 Malignant neoplasm of upper third of esophagus: Secondary | ICD-10-CM | POA: Diagnosis not present

## 2018-10-02 DIAGNOSIS — Z5111 Encounter for antineoplastic chemotherapy: Secondary | ICD-10-CM | POA: Diagnosis not present

## 2018-10-02 DIAGNOSIS — R5383 Other fatigue: Secondary | ICD-10-CM | POA: Diagnosis not present

## 2018-10-02 DIAGNOSIS — R1310 Dysphagia, unspecified: Secondary | ICD-10-CM | POA: Diagnosis not present

## 2018-10-02 DIAGNOSIS — D709 Neutropenia, unspecified: Secondary | ICD-10-CM | POA: Diagnosis not present

## 2018-10-02 DIAGNOSIS — H538 Other visual disturbances: Secondary | ICD-10-CM | POA: Diagnosis not present

## 2018-10-02 DIAGNOSIS — C153 Malignant neoplasm of upper third of esophagus: Secondary | ICD-10-CM | POA: Diagnosis not present

## 2018-10-02 DIAGNOSIS — Z87891 Personal history of nicotine dependence: Secondary | ICD-10-CM | POA: Diagnosis not present

## 2018-10-02 DIAGNOSIS — M791 Myalgia, unspecified site: Secondary | ICD-10-CM | POA: Diagnosis not present

## 2018-10-02 DIAGNOSIS — K5900 Constipation, unspecified: Secondary | ICD-10-CM | POA: Diagnosis not present

## 2018-10-03 DIAGNOSIS — C153 Malignant neoplasm of upper third of esophagus: Secondary | ICD-10-CM | POA: Diagnosis not present

## 2018-10-06 DIAGNOSIS — C153 Malignant neoplasm of upper third of esophagus: Secondary | ICD-10-CM | POA: Diagnosis not present

## 2018-10-07 DIAGNOSIS — C153 Malignant neoplasm of upper third of esophagus: Secondary | ICD-10-CM | POA: Diagnosis not present

## 2018-10-08 DIAGNOSIS — D709 Neutropenia, unspecified: Secondary | ICD-10-CM | POA: Diagnosis not present

## 2018-10-08 DIAGNOSIS — C153 Malignant neoplasm of upper third of esophagus: Secondary | ICD-10-CM | POA: Diagnosis not present

## 2018-10-08 DIAGNOSIS — Z510 Encounter for antineoplastic radiation therapy: Secondary | ICD-10-CM | POA: Diagnosis not present

## 2018-10-08 DIAGNOSIS — R5081 Fever presenting with conditions classified elsewhere: Secondary | ICD-10-CM | POA: Diagnosis not present

## 2018-10-08 DIAGNOSIS — R531 Weakness: Secondary | ICD-10-CM | POA: Diagnosis not present

## 2018-10-09 DIAGNOSIS — C153 Malignant neoplasm of upper third of esophagus: Secondary | ICD-10-CM | POA: Diagnosis not present

## 2018-10-13 ENCOUNTER — Other Ambulatory Visit: Payer: Self-pay | Admitting: Cardiology

## 2018-10-13 DIAGNOSIS — C159 Malignant neoplasm of esophagus, unspecified: Secondary | ICD-10-CM | POA: Diagnosis not present

## 2018-10-13 DIAGNOSIS — C153 Malignant neoplasm of upper third of esophagus: Secondary | ICD-10-CM | POA: Diagnosis not present

## 2018-10-14 DIAGNOSIS — C153 Malignant neoplasm of upper third of esophagus: Secondary | ICD-10-CM | POA: Diagnosis not present

## 2018-10-15 DIAGNOSIS — Z510 Encounter for antineoplastic radiation therapy: Secondary | ICD-10-CM | POA: Diagnosis not present

## 2018-10-15 DIAGNOSIS — C154 Malignant neoplasm of middle third of esophagus: Secondary | ICD-10-CM | POA: Diagnosis not present

## 2018-10-15 DIAGNOSIS — C153 Malignant neoplasm of upper third of esophagus: Secondary | ICD-10-CM | POA: Diagnosis not present

## 2018-10-16 DIAGNOSIS — B379 Candidiasis, unspecified: Secondary | ICD-10-CM | POA: Diagnosis not present

## 2018-10-16 DIAGNOSIS — Z87891 Personal history of nicotine dependence: Secondary | ICD-10-CM | POA: Diagnosis not present

## 2018-10-16 DIAGNOSIS — K5900 Constipation, unspecified: Secondary | ICD-10-CM | POA: Diagnosis not present

## 2018-10-16 DIAGNOSIS — R1310 Dysphagia, unspecified: Secondary | ICD-10-CM | POA: Diagnosis not present

## 2018-10-16 DIAGNOSIS — C153 Malignant neoplasm of upper third of esophagus: Secondary | ICD-10-CM | POA: Diagnosis not present

## 2018-10-16 DIAGNOSIS — Z5111 Encounter for antineoplastic chemotherapy: Secondary | ICD-10-CM | POA: Diagnosis not present

## 2018-10-17 DIAGNOSIS — C153 Malignant neoplasm of upper third of esophagus: Secondary | ICD-10-CM | POA: Diagnosis not present

## 2018-10-20 DIAGNOSIS — C153 Malignant neoplasm of upper third of esophagus: Secondary | ICD-10-CM | POA: Diagnosis not present

## 2018-10-21 DIAGNOSIS — C153 Malignant neoplasm of upper third of esophagus: Secondary | ICD-10-CM | POA: Diagnosis not present

## 2018-10-22 DIAGNOSIS — C153 Malignant neoplasm of upper third of esophagus: Secondary | ICD-10-CM | POA: Diagnosis not present

## 2018-10-22 DIAGNOSIS — C154 Malignant neoplasm of middle third of esophagus: Secondary | ICD-10-CM | POA: Diagnosis not present

## 2018-10-23 DIAGNOSIS — Z5111 Encounter for antineoplastic chemotherapy: Secondary | ICD-10-CM | POA: Diagnosis not present

## 2018-10-23 DIAGNOSIS — Z87891 Personal history of nicotine dependence: Secondary | ICD-10-CM | POA: Diagnosis not present

## 2018-10-23 DIAGNOSIS — C153 Malignant neoplasm of upper third of esophagus: Secondary | ICD-10-CM | POA: Diagnosis not present

## 2018-10-23 DIAGNOSIS — C154 Malignant neoplasm of middle third of esophagus: Secondary | ICD-10-CM | POA: Diagnosis not present

## 2018-10-31 DIAGNOSIS — R638 Other symptoms and signs concerning food and fluid intake: Secondary | ICD-10-CM | POA: Diagnosis not present

## 2018-10-31 DIAGNOSIS — C154 Malignant neoplasm of middle third of esophagus: Secondary | ICD-10-CM | POA: Diagnosis not present

## 2018-11-05 DIAGNOSIS — C154 Malignant neoplasm of middle third of esophagus: Secondary | ICD-10-CM | POA: Diagnosis not present

## 2018-11-05 DIAGNOSIS — R4182 Altered mental status, unspecified: Secondary | ICD-10-CM | POA: Diagnosis not present

## 2018-11-05 DIAGNOSIS — I77819 Aortic ectasia, unspecified site: Secondary | ICD-10-CM | POA: Diagnosis present

## 2018-11-05 DIAGNOSIS — K222 Esophageal obstruction: Secondary | ICD-10-CM | POA: Diagnosis not present

## 2018-11-05 DIAGNOSIS — I110 Hypertensive heart disease with heart failure: Secondary | ICD-10-CM | POA: Diagnosis present

## 2018-11-05 DIAGNOSIS — E860 Dehydration: Secondary | ICD-10-CM | POA: Diagnosis present

## 2018-11-05 DIAGNOSIS — I509 Heart failure, unspecified: Secondary | ICD-10-CM | POA: Diagnosis present

## 2018-11-05 DIAGNOSIS — B9562 Methicillin resistant Staphylococcus aureus infection as the cause of diseases classified elsewhere: Secondary | ICD-10-CM | POA: Diagnosis not present

## 2018-11-05 DIAGNOSIS — Z515 Encounter for palliative care: Secondary | ICD-10-CM | POA: Diagnosis not present

## 2018-11-05 DIAGNOSIS — D709 Neutropenia, unspecified: Secondary | ICD-10-CM | POA: Diagnosis present

## 2018-11-05 DIAGNOSIS — J9601 Acute respiratory failure with hypoxia: Secondary | ICD-10-CM | POA: Diagnosis not present

## 2018-11-05 DIAGNOSIS — D638 Anemia in other chronic diseases classified elsewhere: Secondary | ICD-10-CM | POA: Diagnosis present

## 2018-11-05 DIAGNOSIS — R600 Localized edema: Secondary | ICD-10-CM | POA: Diagnosis not present

## 2018-11-05 DIAGNOSIS — R5081 Fever presenting with conditions classified elsewhere: Secondary | ICD-10-CM | POA: Diagnosis present

## 2018-11-05 DIAGNOSIS — C153 Malignant neoplasm of upper third of esophagus: Secondary | ICD-10-CM | POA: Diagnosis not present

## 2018-11-05 DIAGNOSIS — J90 Pleural effusion, not elsewhere classified: Secondary | ICD-10-CM | POA: Diagnosis not present

## 2018-11-05 DIAGNOSIS — Z9889 Other specified postprocedural states: Secondary | ICD-10-CM | POA: Diagnosis not present

## 2018-11-05 DIAGNOSIS — R7881 Bacteremia: Secondary | ICD-10-CM | POA: Diagnosis not present

## 2018-11-05 DIAGNOSIS — Z8501 Personal history of malignant neoplasm of esophagus: Secondary | ICD-10-CM | POA: Diagnosis not present

## 2018-11-05 DIAGNOSIS — F1011 Alcohol abuse, in remission: Secondary | ICD-10-CM | POA: Diagnosis present

## 2018-11-05 DIAGNOSIS — R579 Shock, unspecified: Secondary | ICD-10-CM | POA: Diagnosis not present

## 2018-11-05 DIAGNOSIS — I2510 Atherosclerotic heart disease of native coronary artery without angina pectoris: Secondary | ICD-10-CM | POA: Diagnosis present

## 2018-11-05 DIAGNOSIS — A419 Sepsis, unspecified organism: Secondary | ICD-10-CM | POA: Diagnosis not present

## 2018-11-05 DIAGNOSIS — Z7982 Long term (current) use of aspirin: Secondary | ICD-10-CM | POA: Diagnosis not present

## 2018-11-05 DIAGNOSIS — R509 Fever, unspecified: Secondary | ICD-10-CM | POA: Diagnosis not present

## 2018-11-05 DIAGNOSIS — R64 Cachexia: Secondary | ICD-10-CM | POA: Diagnosis present

## 2018-11-05 DIAGNOSIS — I959 Hypotension, unspecified: Secondary | ICD-10-CM | POA: Diagnosis not present

## 2018-11-05 DIAGNOSIS — Z20828 Contact with and (suspected) exposure to other viral communicable diseases: Secondary | ICD-10-CM | POA: Diagnosis present

## 2018-11-05 DIAGNOSIS — R1310 Dysphagia, unspecified: Secondary | ICD-10-CM | POA: Diagnosis present

## 2018-11-05 DIAGNOSIS — J690 Pneumonitis due to inhalation of food and vomit: Secondary | ICD-10-CM | POA: Diagnosis not present

## 2018-11-05 DIAGNOSIS — R918 Other nonspecific abnormal finding of lung field: Secondary | ICD-10-CM | POA: Diagnosis not present

## 2018-11-05 DIAGNOSIS — R000 Tachycardia, unspecified: Secondary | ICD-10-CM | POA: Diagnosis not present

## 2018-11-05 DIAGNOSIS — R0902 Hypoxemia: Secondary | ICD-10-CM | POA: Diagnosis not present

## 2018-11-05 DIAGNOSIS — Z87442 Personal history of urinary calculi: Secondary | ICD-10-CM | POA: Diagnosis not present

## 2018-11-05 DIAGNOSIS — E43 Unspecified severe protein-calorie malnutrition: Secondary | ICD-10-CM | POA: Diagnosis present

## 2018-11-05 DIAGNOSIS — Z66 Do not resuscitate: Secondary | ICD-10-CM | POA: Diagnosis present

## 2018-11-05 DIAGNOSIS — R1312 Dysphagia, oropharyngeal phase: Secondary | ICD-10-CM | POA: Diagnosis not present

## 2018-11-05 DIAGNOSIS — L03011 Cellulitis of right finger: Secondary | ICD-10-CM | POA: Diagnosis present

## 2018-11-05 DIAGNOSIS — D6481 Anemia due to antineoplastic chemotherapy: Secondary | ICD-10-CM | POA: Diagnosis present

## 2018-12-01 DEATH — deceased

## 2019-09-04 IMAGING — CT CT CHEST W/ CM
2 of 4 series · 12 of 36 positions shown, 15 images · IV contrast (iopamidol)
Comparison: 06/01/2015 chest radiograph.

CLINICAL DATA: Abnormal upper endoscopy with esophageal mass.
Dysphagia. Former smoker.

EXAM:
CT CHEST WITH CONTRAST
TECHNIQUE: Multidetector CT imaging of the chest was performed during
intravenous contrast administration.
Creatinine was obtained on site at [HOSPITAL] at [REDACTED].
Results: Creatinine 1.0 mg/dL.
CONTRAST:  120mL ICLWBE-655 IOPAMIDOL (ICLWBE-655) INJECTION 61%

[Series 2: chest 2.00 br40 s3 ax · axial · 0.66mm/px · z∈[-986,-686]mm · 9 of 178 slices shown, 12 images]
[im 14/178  mediastinal]
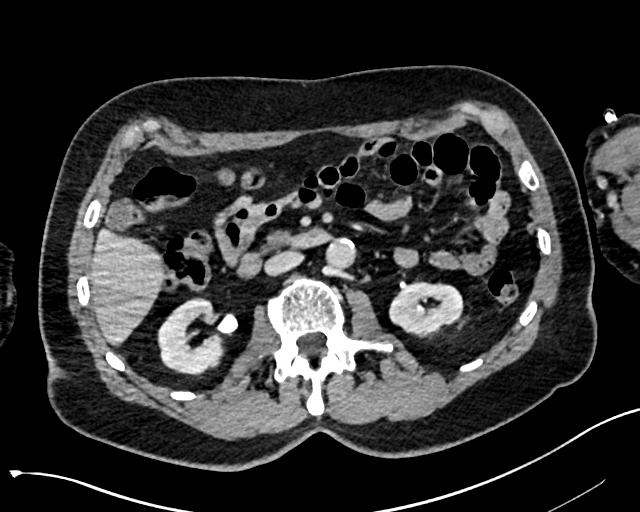
[im 14/178  lung]
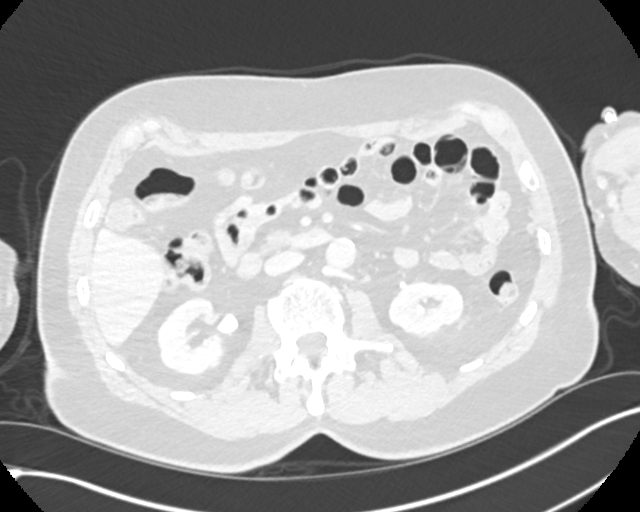
[im 41/178  lung]
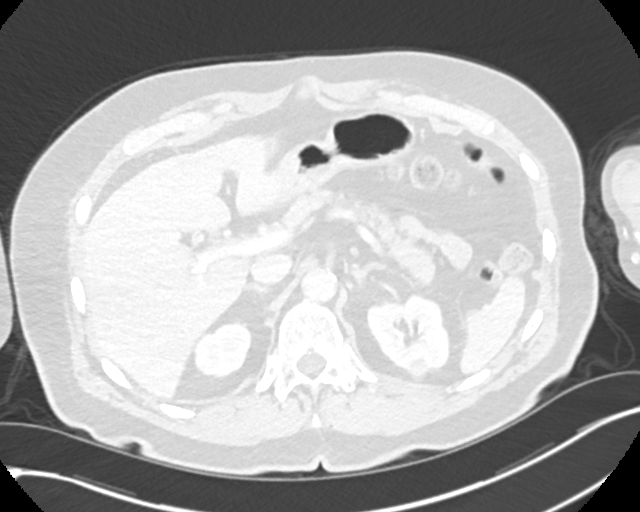
[im 55/178  lung]
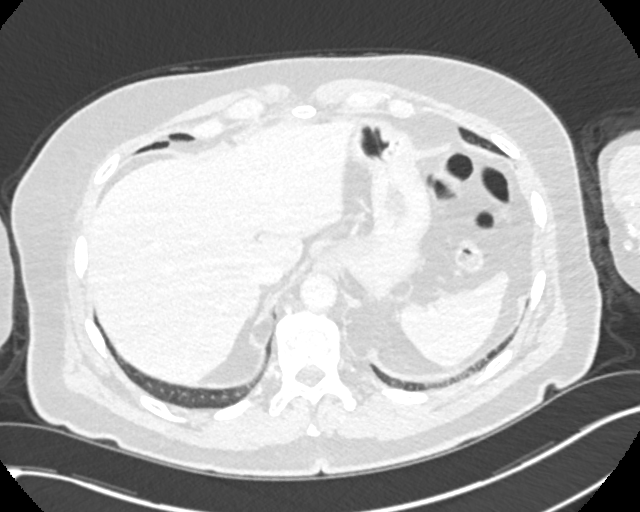
[im 69/178  lung]
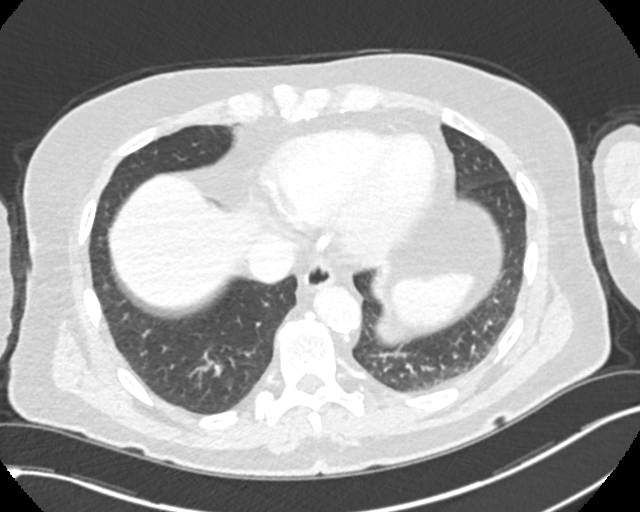
[im 96/178  mediastinal]
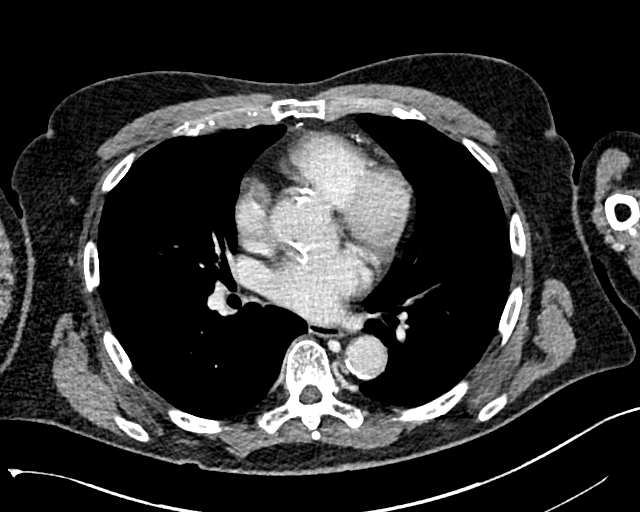
[im 96/178  lung]
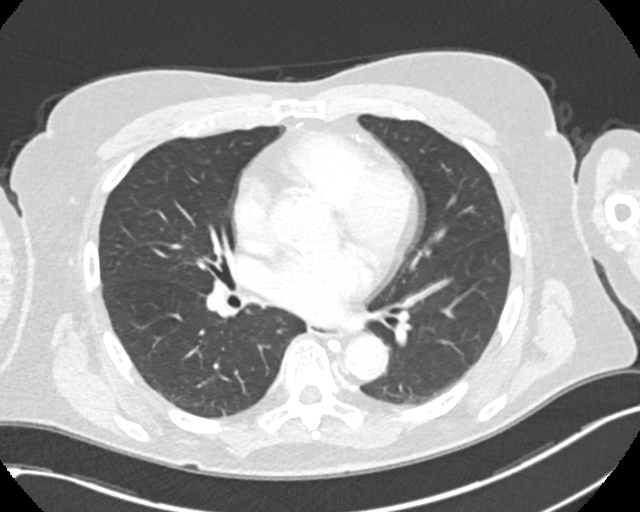
[im 109/178  lung]
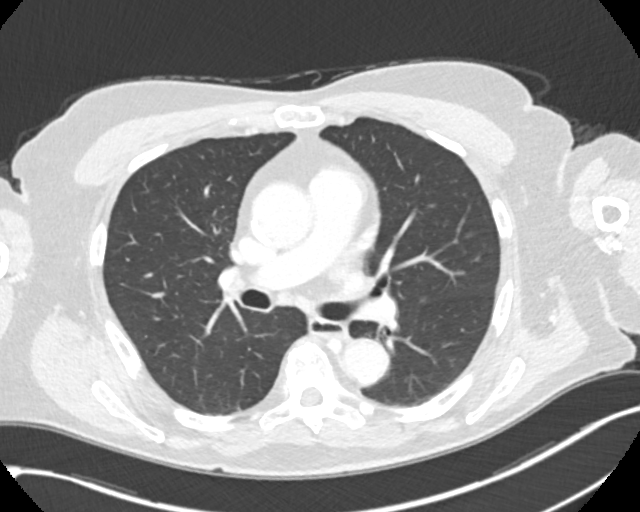
[im 123/178  lung]
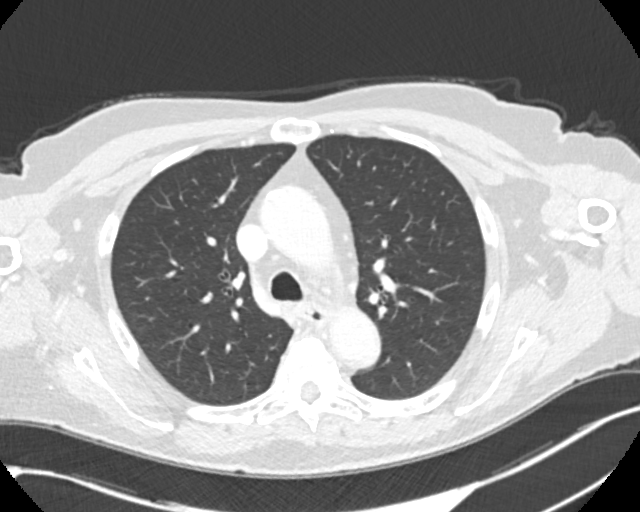
[im 150/178  lung]
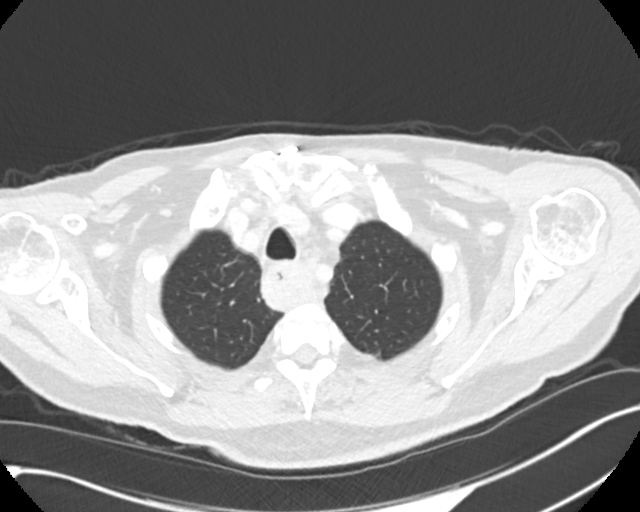
[im 164/178  mediastinal]
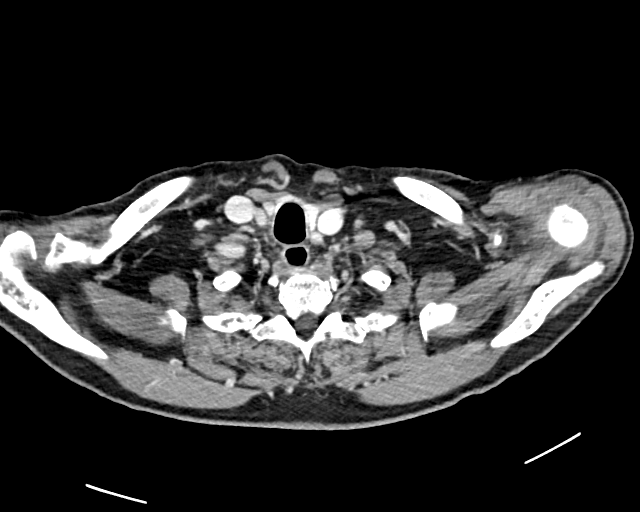
[im 164/178  lung]
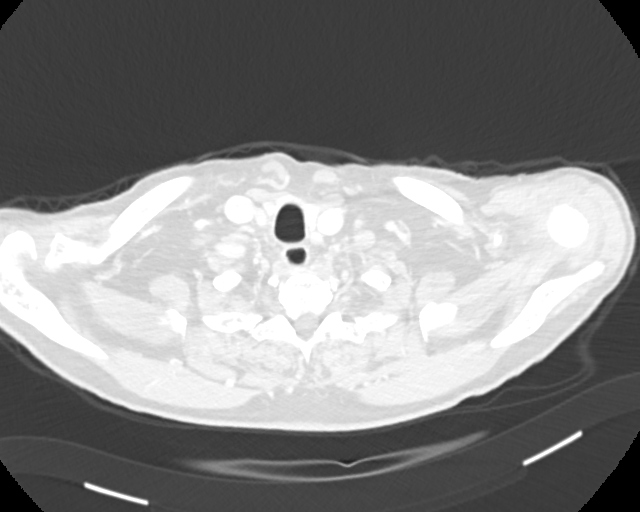

[Series 4: chest 2.00 br40 s3 cor · coronal · 0.70mm/px · 3 of 169 slices shown]
[im 34/169  lung]
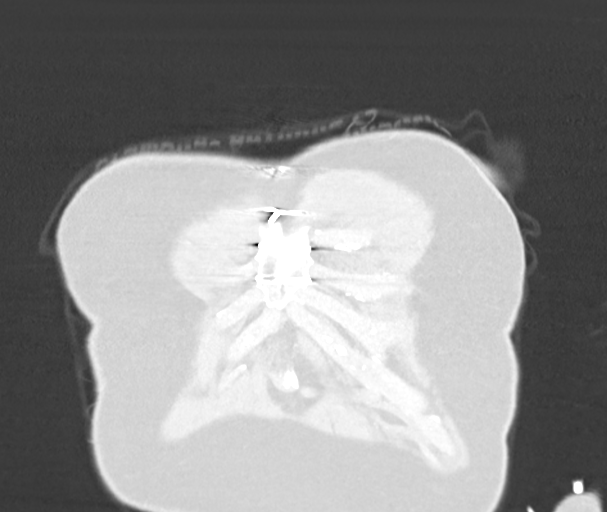
[im 68/169  lung]
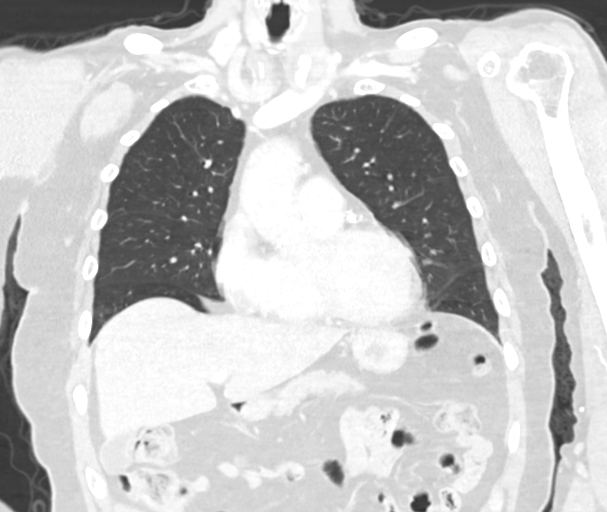
[im 101/169  lung]
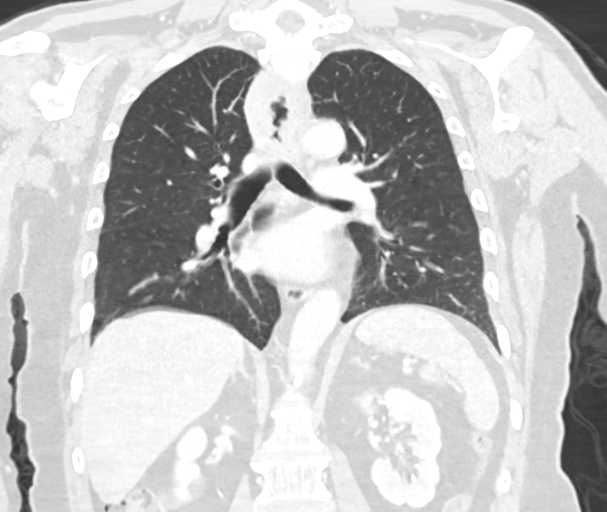

[12 of 36 positions shown; findings below may reference images not displayed]

FINDINGS: Cardiovascular: Top-normal heart size. No significant pericardial
effusion/thickening. Left main, left anterior descending and right
coronary atherosclerosis status post CABG. Atherosclerotic thoracic
aorta with ectatic 4.1 cm ascending thoracic aorta. Normal caliber
pulmonary arteries. No central pulmonary emboli.

Mediastinum/Nodes: No discrete thyroid nodules. There is an
irregular annular 4.1 x 3.4 x 6.4 cm esophageal mass centered in the
upper third of the thoracic esophagus. No pathologically enlarged
axillary, mediastinal or hilar lymph nodes.

Lungs/Pleura: No pneumothorax. No pleural effusion. No acute
consolidative airspace disease or lung masses. There is a solid 4 mm
posterior left upper lobe pulmonary nodule (series 8/image 64). No
additional significant pulmonary nodules. Clustered tiny calcified
granulomas in the posterior right upper lobe.

Upper abdomen: Hypodense 1.9 cm renal cortical lesion in the
posterior upper left kidney (series 2/image 136). Simple 1.9 cm
lower right renal cyst.

Musculoskeletal: No aggressive appearing focal osseous lesions.
Intact sternotomy wires. Moderate thoracic spondylosis.
IMPRESSION: 1. Irregular annular 4.1 x 3.4 x 6.4 cm esophageal mass centered in
the upper third of the thoracic esophagus, suspicious for primary
esophageal malignancy, probably squamous cell carcinoma.
2. No thoracic adenopathy.
3. Solitary 4 mm solid left upper lobe pulmonary nodule, equivocal
for pulmonary metastasis, recommend attention on follow-up chest CT
in 3 months.
4. Indeterminate hypodense 1.9 cm posterior upper left renal
cortical mass, cannot exclude renal cell carcinoma (or less likely
renal metastasis). Renal mass protocol MRI (preferred) or CT abdomen
without and with IV contrast recommended for further
characterization.
5. Ectatic 4.1 cm ascending thoracic aorta. Recommend annual imaging
followup by CTA or MRA. This recommendation follows 1373
ACCF/AHA/AATS/ACR/ASA/SCA/OKO/TIGER/MACILON/SERGIA Guidelines for the
Diagnosis and Management of Patients with Thoracic Aortic Disease.
Circulation. 1373; 121: [id number]. Aortic aneurysm NOS
(438T8-4ER.W).

Aortic Atherosclerosis (438T8-M82.2).
